# Patient Record
Sex: Female | Born: 1975 | Race: White | Hispanic: No | Marital: Married | Smoking: Current every day smoker
Health system: Southern US, Community
[De-identification: ages and names within clinical notes are randomized; demographics above are authoritative.]

## PROBLEM LIST (undated history)

## (undated) DIAGNOSIS — R350 Frequency of micturition: Secondary | ICD-10-CM

## (undated) DIAGNOSIS — M25519 Pain in unspecified shoulder: Secondary | ICD-10-CM

## (undated) DIAGNOSIS — M25559 Pain in unspecified hip: Secondary | ICD-10-CM

## (undated) DIAGNOSIS — Q282 Arteriovenous malformation of cerebral vessels: Secondary | ICD-10-CM

## (undated) DIAGNOSIS — M436 Torticollis: Secondary | ICD-10-CM

## (undated) DIAGNOSIS — F32A Depression, unspecified: Secondary | ICD-10-CM

## (undated) DIAGNOSIS — F445 Conversion disorder with seizures or convulsions: Secondary | ICD-10-CM

## (undated) DIAGNOSIS — E669 Obesity, unspecified: Secondary | ICD-10-CM

## (undated) DIAGNOSIS — N946 Dysmenorrhea, unspecified: Secondary | ICD-10-CM

## (undated) DIAGNOSIS — G43709 Chronic migraine without aura, not intractable, without status migrainosus: Secondary | ICD-10-CM

## (undated) DIAGNOSIS — G44209 Tension-type headache, unspecified, not intractable: Secondary | ICD-10-CM

## (undated) DIAGNOSIS — I10 Essential (primary) hypertension: Secondary | ICD-10-CM

## (undated) DIAGNOSIS — N83209 Unspecified ovarian cyst, unspecified side: Secondary | ICD-10-CM

## (undated) DIAGNOSIS — G40209 Localization-related (focal) (partial) symptomatic epilepsy and epileptic syndromes with complex partial seizures, not intractable, without status epilepticus: Secondary | ICD-10-CM

## (undated) DIAGNOSIS — R51 Headache: Secondary | ICD-10-CM

## (undated) DIAGNOSIS — IMO0001 Reserved for inherently not codable concepts without codable children: Secondary | ICD-10-CM

## (undated) DIAGNOSIS — Q2730 Arteriovenous malformation, site unspecified: Secondary | ICD-10-CM

## (undated) DIAGNOSIS — F419 Anxiety disorder, unspecified: Secondary | ICD-10-CM

## (undated) DIAGNOSIS — K219 Gastro-esophageal reflux disease without esophagitis: Secondary | ICD-10-CM

## (undated) DIAGNOSIS — D1803 Hemangioma of intra-abdominal structures: Secondary | ICD-10-CM

## (undated) DIAGNOSIS — R519 Headache, unspecified: Secondary | ICD-10-CM

## (undated) DIAGNOSIS — F329 Major depressive disorder, single episode, unspecified: Secondary | ICD-10-CM

## (undated) DIAGNOSIS — J309 Allergic rhinitis, unspecified: Secondary | ICD-10-CM

## (undated) DIAGNOSIS — R531 Weakness: Secondary | ICD-10-CM

## (undated) DIAGNOSIS — Z87442 Personal history of urinary calculi: Secondary | ICD-10-CM

## (undated) DIAGNOSIS — R569 Unspecified convulsions: Secondary | ICD-10-CM

## (undated) DIAGNOSIS — G43009 Migraine without aura, not intractable, without status migrainosus: Secondary | ICD-10-CM

## (undated) DIAGNOSIS — M797 Fibromyalgia: Secondary | ICD-10-CM

## (undated) DIAGNOSIS — R404 Transient alteration of awareness: Secondary | ICD-10-CM

## (undated) DIAGNOSIS — M2550 Pain in unspecified joint: Secondary | ICD-10-CM

## (undated) DIAGNOSIS — I671 Cerebral aneurysm, nonruptured: Secondary | ICD-10-CM

## (undated) HISTORY — DX: Arteriovenous malformation of cerebral vessels: Q28.2

## (undated) HISTORY — DX: Obesity, unspecified: E66.9

## (undated) HISTORY — PX: TUBAL LIGATION: SHX77

## (undated) HISTORY — DX: Weakness: R53.1

## (undated) HISTORY — DX: Tension-type headache, unspecified, not intractable: G44.209

## (undated) HISTORY — DX: Pain in unspecified hip: M25.559

## (undated) HISTORY — PX: CEREBRAL ANGIOGRAM: SHX1326

## (undated) HISTORY — DX: Transient alteration of awareness: R40.4

## (undated) HISTORY — DX: Chronic migraine without aura, not intractable, without status migrainosus: G43.709

## (undated) HISTORY — DX: Headache, unspecified: R51.9

## (undated) HISTORY — DX: Conversion disorder with seizures or convulsions: F44.5

## (undated) HISTORY — DX: Pain in unspecified joint: M25.50

## (undated) HISTORY — DX: Pain in unspecified shoulder: M25.519

## (undated) HISTORY — DX: Headache: R51

## (undated) HISTORY — DX: Localization-related (focal) (partial) symptomatic epilepsy and epileptic syndromes with complex partial seizures, not intractable, without status epilepticus: G40.209

## (undated) HISTORY — DX: Frequency of micturition: R35.0

## (undated) HISTORY — DX: Dysmenorrhea, unspecified: N94.6

## (undated) HISTORY — DX: Gastro-esophageal reflux disease without esophagitis: K21.9

## (undated) HISTORY — DX: Unspecified ovarian cyst, unspecified side: N83.209

## (undated) HISTORY — DX: Reserved for inherently not codable concepts without codable children: IMO0001

## (undated) HISTORY — DX: Torticollis: M43.6

## (undated) HISTORY — DX: Personal history of urinary calculi: Z87.442

## (undated) HISTORY — DX: Migraine without aura, not intractable, without status migrainosus: G43.009

## (undated) HISTORY — DX: Hemangioma of intra-abdominal structures: D18.03

## (undated) HISTORY — DX: Allergic rhinitis, unspecified: J30.9

## (undated) HISTORY — PX: OTHER SURGICAL HISTORY: SHX169

---

## 1999-03-25 HISTORY — PX: WISDOM TOOTH EXTRACTION: SHX21

## 2005-11-18 ENCOUNTER — Inpatient Hospital Stay (HOSPITAL_COMMUNITY): Admission: AD | Admit: 2005-11-18 | Discharge: 2005-11-18 | Payer: Self-pay | Admitting: Family Medicine

## 2005-12-19 ENCOUNTER — Inpatient Hospital Stay (HOSPITAL_COMMUNITY): Admission: AD | Admit: 2005-12-19 | Discharge: 2005-12-19 | Payer: Self-pay | Admitting: Family Medicine

## 2006-02-27 ENCOUNTER — Ambulatory Visit (HOSPITAL_COMMUNITY): Admission: RE | Admit: 2006-02-27 | Discharge: 2006-02-27 | Payer: Self-pay | Admitting: Obstetrics and Gynecology

## 2006-03-13 ENCOUNTER — Inpatient Hospital Stay (HOSPITAL_COMMUNITY): Admission: AD | Admit: 2006-03-13 | Discharge: 2006-03-13 | Payer: Self-pay | Admitting: Obstetrics and Gynecology

## 2006-03-25 ENCOUNTER — Emergency Department (HOSPITAL_COMMUNITY): Admission: EM | Admit: 2006-03-25 | Discharge: 2006-03-25 | Payer: Self-pay | Admitting: Family Medicine

## 2006-05-04 ENCOUNTER — Inpatient Hospital Stay (HOSPITAL_COMMUNITY): Admission: AD | Admit: 2006-05-04 | Discharge: 2006-05-04 | Payer: Self-pay | Admitting: Obstetrics and Gynecology

## 2006-07-21 ENCOUNTER — Inpatient Hospital Stay (HOSPITAL_COMMUNITY): Admission: AD | Admit: 2006-07-21 | Discharge: 2006-07-21 | Payer: Self-pay | Admitting: Obstetrics and Gynecology

## 2006-07-27 ENCOUNTER — Inpatient Hospital Stay (HOSPITAL_COMMUNITY): Admission: AD | Admit: 2006-07-27 | Discharge: 2006-07-29 | Payer: Self-pay | Admitting: Obstetrics & Gynecology

## 2006-07-27 ENCOUNTER — Inpatient Hospital Stay (HOSPITAL_COMMUNITY): Admission: AD | Admit: 2006-07-27 | Discharge: 2006-07-27 | Payer: Self-pay | Admitting: Obstetrics and Gynecology

## 2007-03-04 ENCOUNTER — Ambulatory Visit: Payer: Self-pay | Admitting: Nurse Practitioner

## 2007-03-04 DIAGNOSIS — F419 Anxiety disorder, unspecified: Secondary | ICD-10-CM

## 2007-03-04 DIAGNOSIS — K219 Gastro-esophageal reflux disease without esophagitis: Secondary | ICD-10-CM

## 2007-03-04 DIAGNOSIS — F329 Major depressive disorder, single episode, unspecified: Secondary | ICD-10-CM

## 2007-03-04 DIAGNOSIS — Z87442 Personal history of urinary calculi: Secondary | ICD-10-CM

## 2007-03-04 HISTORY — DX: Gastro-esophageal reflux disease without esophagitis: K21.9

## 2007-03-04 HISTORY — DX: Personal history of urinary calculi: Z87.442

## 2007-03-08 ENCOUNTER — Ambulatory Visit: Payer: Self-pay | Admitting: *Deleted

## 2007-04-05 ENCOUNTER — Ambulatory Visit: Payer: Self-pay | Admitting: Nurse Practitioner

## 2007-04-07 ENCOUNTER — Telehealth (INDEPENDENT_AMBULATORY_CARE_PROVIDER_SITE_OTHER): Payer: Self-pay | Admitting: Nurse Practitioner

## 2007-04-08 ENCOUNTER — Encounter (INDEPENDENT_AMBULATORY_CARE_PROVIDER_SITE_OTHER): Payer: Self-pay | Admitting: Nurse Practitioner

## 2007-04-09 ENCOUNTER — Encounter (INDEPENDENT_AMBULATORY_CARE_PROVIDER_SITE_OTHER): Payer: Self-pay | Admitting: Nurse Practitioner

## 2007-04-09 LAB — CONVERTED CEMR LAB: Pap Smear: NEGATIVE

## 2007-06-04 ENCOUNTER — Ambulatory Visit: Payer: Self-pay | Admitting: Nurse Practitioner

## 2007-06-04 DIAGNOSIS — E669 Obesity, unspecified: Secondary | ICD-10-CM | POA: Insufficient documentation

## 2007-06-04 HISTORY — DX: Obesity, unspecified: E66.9

## 2007-06-08 ENCOUNTER — Telehealth (INDEPENDENT_AMBULATORY_CARE_PROVIDER_SITE_OTHER): Payer: Self-pay | Admitting: Nurse Practitioner

## 2007-06-30 ENCOUNTER — Ambulatory Visit: Payer: Self-pay | Admitting: Nurse Practitioner

## 2007-06-30 LAB — CONVERTED CEMR LAB
Bilirubin Urine: NEGATIVE
Glucose, Urine, Semiquant: NEGATIVE
KOH Prep: NEGATIVE
Ketones, urine, test strip: NEGATIVE
pH: 5.5

## 2007-07-07 ENCOUNTER — Ambulatory Visit (HOSPITAL_COMMUNITY): Admission: RE | Admit: 2007-07-07 | Discharge: 2007-07-07 | Payer: Self-pay | Admitting: Nurse Practitioner

## 2007-08-03 ENCOUNTER — Telehealth (INDEPENDENT_AMBULATORY_CARE_PROVIDER_SITE_OTHER): Payer: Self-pay | Admitting: Nurse Practitioner

## 2007-08-05 ENCOUNTER — Ambulatory Visit: Payer: Self-pay | Admitting: Nurse Practitioner

## 2007-08-05 LAB — CONVERTED CEMR LAB: KOH Prep: NEGATIVE

## 2007-10-25 ENCOUNTER — Ambulatory Visit: Payer: Self-pay | Admitting: Nurse Practitioner

## 2007-10-25 LAB — CONVERTED CEMR LAB
Bilirubin Urine: NEGATIVE
Nitrite: NEGATIVE
Specific Gravity, Urine: 1.025
Urobilinogen, UA: 0.2

## 2007-10-29 ENCOUNTER — Ambulatory Visit (HOSPITAL_COMMUNITY): Admission: RE | Admit: 2007-10-29 | Discharge: 2007-10-29 | Payer: Self-pay | Admitting: Family Medicine

## 2007-11-02 ENCOUNTER — Emergency Department (HOSPITAL_COMMUNITY): Admission: EM | Admit: 2007-11-02 | Discharge: 2007-11-02 | Payer: Self-pay | Admitting: Emergency Medicine

## 2007-11-03 ENCOUNTER — Ambulatory Visit: Payer: Self-pay | Admitting: Nurse Practitioner

## 2007-11-03 DIAGNOSIS — N83209 Unspecified ovarian cyst, unspecified side: Secondary | ICD-10-CM

## 2007-11-03 HISTORY — DX: Unspecified ovarian cyst, unspecified side: N83.209

## 2007-11-04 ENCOUNTER — Encounter (INDEPENDENT_AMBULATORY_CARE_PROVIDER_SITE_OTHER): Payer: Self-pay | Admitting: Nurse Practitioner

## 2007-11-15 ENCOUNTER — Encounter (INDEPENDENT_AMBULATORY_CARE_PROVIDER_SITE_OTHER): Payer: Self-pay | Admitting: Nurse Practitioner

## 2007-11-15 ENCOUNTER — Encounter: Admission: RE | Admit: 2007-11-15 | Discharge: 2007-12-16 | Payer: Self-pay | Admitting: Nurse Practitioner

## 2007-12-22 ENCOUNTER — Encounter (INDEPENDENT_AMBULATORY_CARE_PROVIDER_SITE_OTHER): Payer: Self-pay | Admitting: Nurse Practitioner

## 2008-01-05 ENCOUNTER — Ambulatory Visit: Payer: Self-pay | Admitting: Nurse Practitioner

## 2008-01-05 DIAGNOSIS — J309 Allergic rhinitis, unspecified: Secondary | ICD-10-CM

## 2008-01-05 HISTORY — DX: Allergic rhinitis, unspecified: J30.9

## 2008-01-06 ENCOUNTER — Telehealth (INDEPENDENT_AMBULATORY_CARE_PROVIDER_SITE_OTHER): Payer: Self-pay | Admitting: Nurse Practitioner

## 2008-01-10 ENCOUNTER — Telehealth (INDEPENDENT_AMBULATORY_CARE_PROVIDER_SITE_OTHER): Payer: Self-pay | Admitting: Nurse Practitioner

## 2008-01-11 ENCOUNTER — Ambulatory Visit (HOSPITAL_COMMUNITY): Admission: RE | Admit: 2008-01-11 | Discharge: 2008-01-11 | Payer: Self-pay | Admitting: Family Medicine

## 2008-01-11 ENCOUNTER — Encounter (INDEPENDENT_AMBULATORY_CARE_PROVIDER_SITE_OTHER): Payer: Self-pay | Admitting: Nurse Practitioner

## 2008-01-19 ENCOUNTER — Emergency Department (HOSPITAL_COMMUNITY): Admission: EM | Admit: 2008-01-19 | Discharge: 2008-01-20 | Payer: Self-pay | Admitting: Emergency Medicine

## 2008-03-09 ENCOUNTER — Ambulatory Visit: Payer: Self-pay | Admitting: Nurse Practitioner

## 2008-03-28 ENCOUNTER — Emergency Department (HOSPITAL_COMMUNITY): Admission: EM | Admit: 2008-03-28 | Discharge: 2008-03-28 | Payer: Self-pay | Admitting: Emergency Medicine

## 2008-03-28 ENCOUNTER — Telehealth (INDEPENDENT_AMBULATORY_CARE_PROVIDER_SITE_OTHER): Payer: Self-pay | Admitting: Nurse Practitioner

## 2008-04-05 ENCOUNTER — Ambulatory Visit (HOSPITAL_COMMUNITY): Admission: RE | Admit: 2008-04-05 | Discharge: 2008-04-05 | Payer: Self-pay | Admitting: Obstetrics & Gynecology

## 2008-05-11 ENCOUNTER — Ambulatory Visit: Payer: Self-pay | Admitting: Nurse Practitioner

## 2008-05-11 DIAGNOSIS — G44209 Tension-type headache, unspecified, not intractable: Secondary | ICD-10-CM

## 2008-05-11 HISTORY — DX: Tension-type headache, unspecified, not intractable: G44.209

## 2008-07-17 ENCOUNTER — Telehealth (INDEPENDENT_AMBULATORY_CARE_PROVIDER_SITE_OTHER): Payer: Self-pay | Admitting: Nurse Practitioner

## 2008-07-18 ENCOUNTER — Emergency Department (HOSPITAL_COMMUNITY): Admission: EM | Admit: 2008-07-18 | Discharge: 2008-07-18 | Payer: Self-pay | Admitting: Family Medicine

## 2008-07-24 ENCOUNTER — Encounter (INDEPENDENT_AMBULATORY_CARE_PROVIDER_SITE_OTHER): Payer: Self-pay | Admitting: *Deleted

## 2008-08-18 ENCOUNTER — Ambulatory Visit: Payer: Self-pay | Admitting: Nurse Practitioner

## 2008-08-18 LAB — CONVERTED CEMR LAB
Glucose, Urine, Semiquant: NEGATIVE
Protein, U semiquant: 30
Specific Gravity, Urine: 1.025
Urobilinogen, UA: 0.2

## 2008-08-29 ENCOUNTER — Encounter: Admission: RE | Admit: 2008-08-29 | Discharge: 2008-08-29 | Payer: Self-pay | Admitting: Family Medicine

## 2008-09-19 ENCOUNTER — Ambulatory Visit: Payer: Self-pay | Admitting: Nurse Practitioner

## 2008-10-09 ENCOUNTER — Telehealth (INDEPENDENT_AMBULATORY_CARE_PROVIDER_SITE_OTHER): Payer: Self-pay | Admitting: Nurse Practitioner

## 2008-10-12 ENCOUNTER — Ambulatory Visit: Payer: Self-pay | Admitting: Internal Medicine

## 2008-10-12 DIAGNOSIS — N946 Dysmenorrhea, unspecified: Secondary | ICD-10-CM

## 2008-10-12 HISTORY — DX: Dysmenorrhea, unspecified: N94.6

## 2008-10-12 LAB — CONVERTED CEMR LAB
Chlamydia, DNA Probe: NEGATIVE
GC Probe Amp, Genital: NEGATIVE
KOH Prep: NEGATIVE
Whiff Test: POSITIVE

## 2008-10-13 ENCOUNTER — Telehealth (INDEPENDENT_AMBULATORY_CARE_PROVIDER_SITE_OTHER): Payer: Self-pay | Admitting: Nurse Practitioner

## 2008-10-31 ENCOUNTER — Ambulatory Visit (HOSPITAL_COMMUNITY): Admission: RE | Admit: 2008-10-31 | Discharge: 2008-10-31 | Payer: Self-pay | Admitting: Physician Assistant

## 2008-10-31 ENCOUNTER — Encounter (INDEPENDENT_AMBULATORY_CARE_PROVIDER_SITE_OTHER): Payer: Self-pay | Admitting: Nurse Practitioner

## 2008-10-31 ENCOUNTER — Ambulatory Visit: Payer: Self-pay | Admitting: Physician Assistant

## 2008-10-31 DIAGNOSIS — M25519 Pain in unspecified shoulder: Secondary | ICD-10-CM

## 2008-10-31 HISTORY — DX: Pain in unspecified shoulder: M25.519

## 2008-11-03 ENCOUNTER — Telehealth (INDEPENDENT_AMBULATORY_CARE_PROVIDER_SITE_OTHER): Payer: Self-pay | Admitting: *Deleted

## 2008-11-16 ENCOUNTER — Ambulatory Visit: Payer: Self-pay | Admitting: Nurse Practitioner

## 2008-12-01 ENCOUNTER — Telehealth (INDEPENDENT_AMBULATORY_CARE_PROVIDER_SITE_OTHER): Payer: Self-pay | Admitting: Nurse Practitioner

## 2008-12-11 ENCOUNTER — Encounter (INDEPENDENT_AMBULATORY_CARE_PROVIDER_SITE_OTHER): Payer: Self-pay | Admitting: Nurse Practitioner

## 2008-12-12 ENCOUNTER — Encounter (INDEPENDENT_AMBULATORY_CARE_PROVIDER_SITE_OTHER): Payer: Self-pay | Admitting: Nurse Practitioner

## 2008-12-25 ENCOUNTER — Encounter (INDEPENDENT_AMBULATORY_CARE_PROVIDER_SITE_OTHER): Payer: Self-pay | Admitting: Nurse Practitioner

## 2008-12-25 ENCOUNTER — Encounter: Admission: RE | Admit: 2008-12-25 | Discharge: 2009-02-06 | Payer: Self-pay | Admitting: Nurse Practitioner

## 2009-01-03 ENCOUNTER — Ambulatory Visit: Payer: Self-pay | Admitting: Nurse Practitioner

## 2009-01-11 ENCOUNTER — Telehealth (INDEPENDENT_AMBULATORY_CARE_PROVIDER_SITE_OTHER): Payer: Self-pay | Admitting: Nurse Practitioner

## 2009-04-12 ENCOUNTER — Encounter: Payer: Self-pay | Admitting: Obstetrics and Gynecology

## 2009-04-12 ENCOUNTER — Ambulatory Visit: Payer: Self-pay | Admitting: Obstetrics and Gynecology

## 2009-04-12 LAB — CONVERTED CEMR LAB
MCHC: 32.8 g/dL (ref 30.0–36.0)
MCV: 89.4 fL (ref 78.0–100.0)
Pap Smear: NEGATIVE
Platelets: 214 10*3/uL (ref 150–400)
RBC: 4.53 M/uL (ref 3.87–5.11)
TSH: 1.018 microintl units/mL (ref 0.350–4.500)

## 2009-05-29 ENCOUNTER — Ambulatory Visit: Payer: Self-pay | Admitting: Nurse Practitioner

## 2009-05-29 ENCOUNTER — Telehealth (INDEPENDENT_AMBULATORY_CARE_PROVIDER_SITE_OTHER): Payer: Self-pay | Admitting: Nurse Practitioner

## 2009-05-29 LAB — CONVERTED CEMR LAB
Ketones, urine, test strip: NEGATIVE
Nitrite: NEGATIVE
Specific Gravity, Urine: 1.01
Urobilinogen, UA: 0.2
pH: 7

## 2009-06-05 ENCOUNTER — Ambulatory Visit (HOSPITAL_COMMUNITY): Admission: RE | Admit: 2009-06-05 | Discharge: 2009-06-05 | Payer: Self-pay | Admitting: Internal Medicine

## 2009-06-07 DIAGNOSIS — D1803 Hemangioma of intra-abdominal structures: Secondary | ICD-10-CM | POA: Insufficient documentation

## 2009-06-07 HISTORY — DX: Hemangioma of intra-abdominal structures: D18.03

## 2009-06-11 ENCOUNTER — Telehealth (INDEPENDENT_AMBULATORY_CARE_PROVIDER_SITE_OTHER): Payer: Self-pay | Admitting: Nurse Practitioner

## 2009-06-19 ENCOUNTER — Telehealth (INDEPENDENT_AMBULATORY_CARE_PROVIDER_SITE_OTHER): Payer: Self-pay | Admitting: Nurse Practitioner

## 2009-06-21 ENCOUNTER — Ambulatory Visit: Payer: Self-pay | Admitting: Nurse Practitioner

## 2009-06-29 ENCOUNTER — Ambulatory Visit: Payer: Self-pay | Admitting: Nurse Practitioner

## 2009-07-17 ENCOUNTER — Ambulatory Visit: Payer: Self-pay | Admitting: Nurse Practitioner

## 2009-07-17 LAB — CONVERTED CEMR LAB
AST: 22 units/L (ref 0–37)
Albumin: 4.7 g/dL (ref 3.5–5.2)
Alkaline Phosphatase: 95 units/L (ref 39–117)
Basophils Absolute: 0 10*3/uL (ref 0.0–0.1)
Basophils Relative: 0 % (ref 0–1)
Glucose, Bld: 99 mg/dL (ref 70–99)
HDL: 29 mg/dL — ABNORMAL LOW (ref >39)
LDL Cholesterol: 73 mg/dL (ref 0–99)
Lymphocytes Relative: 34 % (ref 12–46)
MCHC: 32 g/dL (ref 30.0–36.0)
Monocytes Absolute: 0.3 10*3/uL (ref 0.1–1.0)
Neutro Abs: 2.6 10*3/uL (ref 1.7–7.7)
Neutrophils Relative %: 58 % (ref 43–77)
Platelets: 200 10*3/uL (ref 150–400)
Potassium: 3.8 meq/L (ref 3.5–5.3)
RDW: 14.6 % (ref 11.5–15.5)
Sodium: 140 meq/L (ref 135–145)
TSH: 0.632 microintl units/mL (ref 0.350–4.500)
Total Bilirubin: 0.7 mg/dL (ref 0.3–1.2)
Total Protein: 7.4 g/dL (ref 6.0–8.3)
Triglycerides: 240 mg/dL — ABNORMAL HIGH (ref <150)
VLDL: 48 mg/dL — ABNORMAL HIGH (ref 0–40)

## 2009-07-23 ENCOUNTER — Encounter (INDEPENDENT_AMBULATORY_CARE_PROVIDER_SITE_OTHER): Payer: Self-pay | Admitting: Nurse Practitioner

## 2009-08-10 ENCOUNTER — Telehealth (INDEPENDENT_AMBULATORY_CARE_PROVIDER_SITE_OTHER): Payer: Self-pay | Admitting: Nurse Practitioner

## 2009-08-24 ENCOUNTER — Ambulatory Visit: Payer: Self-pay | Admitting: Nurse Practitioner

## 2009-09-11 ENCOUNTER — Telehealth (INDEPENDENT_AMBULATORY_CARE_PROVIDER_SITE_OTHER): Payer: Self-pay | Admitting: Nurse Practitioner

## 2009-09-28 ENCOUNTER — Ambulatory Visit: Payer: Self-pay | Admitting: Nurse Practitioner

## 2009-10-03 ENCOUNTER — Telehealth (INDEPENDENT_AMBULATORY_CARE_PROVIDER_SITE_OTHER): Payer: Self-pay | Admitting: Nurse Practitioner

## 2009-10-16 ENCOUNTER — Telehealth (INDEPENDENT_AMBULATORY_CARE_PROVIDER_SITE_OTHER): Payer: Self-pay | Admitting: Nurse Practitioner

## 2009-10-24 ENCOUNTER — Emergency Department (HOSPITAL_COMMUNITY): Admission: EM | Admit: 2009-10-24 | Discharge: 2009-10-24 | Payer: Self-pay | Admitting: Emergency Medicine

## 2009-10-25 ENCOUNTER — Ambulatory Visit: Payer: Self-pay | Admitting: Internal Medicine

## 2009-11-15 ENCOUNTER — Telehealth: Payer: Self-pay | Admitting: Internal Medicine

## 2009-11-21 ENCOUNTER — Telehealth: Payer: Self-pay | Admitting: Internal Medicine

## 2009-11-23 ENCOUNTER — Ambulatory Visit: Payer: Self-pay | Admitting: Internal Medicine

## 2009-11-27 ENCOUNTER — Telehealth (INDEPENDENT_AMBULATORY_CARE_PROVIDER_SITE_OTHER): Payer: Self-pay | Admitting: Nurse Practitioner

## 2009-12-18 ENCOUNTER — Telehealth (INDEPENDENT_AMBULATORY_CARE_PROVIDER_SITE_OTHER): Payer: Self-pay | Admitting: Nurse Practitioner

## 2010-01-02 ENCOUNTER — Emergency Department (HOSPITAL_COMMUNITY): Admission: EM | Admit: 2010-01-02 | Discharge: 2010-01-02 | Payer: Self-pay | Admitting: Family Medicine

## 2010-01-28 ENCOUNTER — Ambulatory Visit: Payer: Self-pay | Admitting: Nurse Practitioner

## 2010-01-28 DIAGNOSIS — M25559 Pain in unspecified hip: Secondary | ICD-10-CM

## 2010-01-28 HISTORY — DX: Pain in unspecified hip: M25.559

## 2010-02-08 ENCOUNTER — Encounter (INDEPENDENT_AMBULATORY_CARE_PROVIDER_SITE_OTHER): Payer: Self-pay | Admitting: *Deleted

## 2010-02-11 ENCOUNTER — Telehealth (INDEPENDENT_AMBULATORY_CARE_PROVIDER_SITE_OTHER): Payer: Self-pay | Admitting: Nurse Practitioner

## 2010-02-19 ENCOUNTER — Ambulatory Visit (HOSPITAL_COMMUNITY): Admission: RE | Admit: 2010-02-19 | Discharge: 2010-02-19 | Payer: Self-pay | Admitting: Internal Medicine

## 2010-02-20 ENCOUNTER — Telehealth (INDEPENDENT_AMBULATORY_CARE_PROVIDER_SITE_OTHER): Payer: Self-pay | Admitting: Nurse Practitioner

## 2010-02-28 ENCOUNTER — Telehealth (INDEPENDENT_AMBULATORY_CARE_PROVIDER_SITE_OTHER): Payer: Self-pay | Admitting: Nurse Practitioner

## 2010-03-01 ENCOUNTER — Ambulatory Visit: Payer: Self-pay | Admitting: Nurse Practitioner

## 2010-03-01 DIAGNOSIS — R350 Frequency of micturition: Secondary | ICD-10-CM

## 2010-03-01 HISTORY — DX: Frequency of micturition: R35.0

## 2010-03-01 LAB — CONVERTED CEMR LAB
Bilirubin Urine: NEGATIVE
Nitrite: NEGATIVE
Specific Gravity, Urine: 1.015
Urobilinogen, UA: 1
pH: 7

## 2010-03-02 ENCOUNTER — Encounter (INDEPENDENT_AMBULATORY_CARE_PROVIDER_SITE_OTHER): Payer: Self-pay | Admitting: Nurse Practitioner

## 2010-03-27 ENCOUNTER — Encounter (INDEPENDENT_AMBULATORY_CARE_PROVIDER_SITE_OTHER): Payer: Self-pay | Admitting: Nurse Practitioner

## 2010-03-27 ENCOUNTER — Telehealth (INDEPENDENT_AMBULATORY_CARE_PROVIDER_SITE_OTHER): Payer: Self-pay | Admitting: Nurse Practitioner

## 2010-03-27 ENCOUNTER — Ambulatory Visit: Admit: 2010-03-27 | Payer: Self-pay | Admitting: Internal Medicine

## 2010-03-27 DIAGNOSIS — M2550 Pain in unspecified joint: Secondary | ICD-10-CM

## 2010-03-27 HISTORY — DX: Pain in unspecified joint: M25.50

## 2010-04-01 ENCOUNTER — Telehealth (INDEPENDENT_AMBULATORY_CARE_PROVIDER_SITE_OTHER): Payer: Self-pay | Admitting: Nurse Practitioner

## 2010-04-03 ENCOUNTER — Ambulatory Visit: Admit: 2010-04-03 | Payer: Self-pay | Admitting: Nurse Practitioner

## 2010-04-05 ENCOUNTER — Telehealth (INDEPENDENT_AMBULATORY_CARE_PROVIDER_SITE_OTHER): Payer: Self-pay | Admitting: Nurse Practitioner

## 2010-04-13 ENCOUNTER — Encounter: Payer: Self-pay | Admitting: Internal Medicine

## 2010-04-14 ENCOUNTER — Encounter: Payer: Self-pay | Admitting: Internal Medicine

## 2010-04-15 ENCOUNTER — Encounter: Payer: Self-pay | Admitting: Family Medicine

## 2010-04-15 ENCOUNTER — Encounter: Payer: Self-pay | Admitting: Internal Medicine

## 2010-04-21 LAB — CONVERTED CEMR LAB
AST: 27 units/L (ref 0–37)
Albumin: 4.9 g/dL (ref 3.5–5.2)
BUN: 16 mg/dL (ref 6–23)
Beta hcg, urine, semiquantitative: NEGATIVE
Bilirubin Urine: NEGATIVE
CO2: 21 meq/L (ref 19–32)
Calcium: 9.1 mg/dL (ref 8.4–10.5)
Chlamydia, DNA Probe: NEGATIVE
Chloride: 107 meq/L (ref 96–112)
Cholesterol: 110 mg/dL (ref 0–200)
Creatinine, Ser: 0.71 mg/dL (ref 0.40–1.20)
Glucose, Bld: 76 mg/dL (ref 70–99)
HDL: 33 mg/dL — ABNORMAL LOW (ref >39)
Hemoglobin: 14.5 g/dL (ref 12.0–15.0)
Ketones, urine, test strip: NEGATIVE
Lymphocytes Relative: 33 % (ref 12–46)
Lymphs Abs: 2.3 10*3/uL (ref 0.7–4.0)
MCHC: 32.4 g/dL (ref 30.0–36.0)
Monocytes Absolute: 0.5 10*3/uL (ref 0.1–1.0)
Monocytes Relative: 7 % (ref 3–12)
Neutro Abs: 4 10*3/uL (ref 1.7–7.7)
Neutrophils Relative %: 59 % (ref 43–77)
Nitrite: NEGATIVE
Potassium: 4.3 meq/L (ref 3.5–5.3)
RBC: 4.6 M/uL (ref 3.87–5.11)
TSH: 0.67 microintl units/mL (ref 0.350–5.50)
Total CHOL/HDL Ratio: 3.3
Triglycerides: 147 mg/dL (ref <150)
Urobilinogen, UA: 0.2
WBC: 6.8 10*3/uL (ref 4.0–10.5)

## 2010-04-23 ENCOUNTER — Telehealth (INDEPENDENT_AMBULATORY_CARE_PROVIDER_SITE_OTHER): Payer: Self-pay | Admitting: Nurse Practitioner

## 2010-04-25 NOTE — Letter (Signed)
Summary: Handout Printed  Printed Handout:  - Sciatica 

## 2010-04-25 NOTE — Assessment & Plan Note (Signed)
Summary: Frequent Urination  Nurse Visit   Vital Signs:  Patient profile:   35 year old female Menstrual status:  irregular LMP:     02/13/2010 Temp:     97.1 degrees F  Vitals Entered By: Gaylyn Cheers RN (March 01, 2010 10:08 AM) CC: frequency urination with pelvic pain LMP (date): 02/13/2010 LMP - Character: heavy     Enter LMP: 02/13/2010 Last PAP Result NEGATIVE FOR INTRAEPITHELIAL LESIONS OR MALIGNANCY.   Primary Care Provider:  Etta Grandchild MD  CC:  frequency urination with pelvic pain.  History of Present Illness: Feels like she has a plug in her urethra when she tries to make herself void has sharp lower pelvic pain, frequency with sm amts. of urine, when bladder is full has had some lt flank pain. Hx of kidney stones. Denies vag D/C and itching. Has had similar symptoms while on her period. Last period 02/13/10. Will discuss with Jesse Fall.    Allergies: No Known Drug Allergies Laboratory Results   Urine Tests  Date/Time Received: March 01, 2010 10:19 AM   Routine Urinalysis   Color: lt. yellow Glucose: negative   (Normal Range: Negative) Bilirubin: negative   (Normal Range: Negative) Ketone: negative   (Normal Range: Negative) Spec. Gravity: 1.015   (Normal Range: 1.003-1.035) Blood: negative   (Normal Range: Negative) pH: 7.0   (Normal Range: 5.0-8.0) Protein: trace   (Normal Range: Negative) Urobilinogen: 1.0   (Normal Range: 0-1) Nitrite: negative   (Normal Range: Negative) Leukocyte Esterace: negative   (Normal Range: Negative)      Wet Mount Source: vaginal WBC/hpf: 1-5 Bacteria/hpf: rare Clue cells/hpf: none Yeast/hpf: none Wet Mount KOH: Negative Trichomonas/hpf: none   Patient Instructions: 1)  Urine results are normal. 2)  Will send for culture to verify. 3)  Wet prep looks ok. 4)  Be sure to drink plenty of WATER.  No caffinated beverages. 5)  This may be external irritation.   6)  So be sure not to use body wash,  scented lotion, pads, powder or anything else in the vaginal area. 7)  No thong underwear. 8)  Wipe from front to back  Orders Added: 1)  Est. Patient Nurse visit [09003] 2)  Wet Prep [81191YN] 3)  UA Dipstick w/o Micro (automated)  [81003] 4)  T-Culture, Urine [[id number]]  Laboratory Results   Urine Tests    Routine Urinalysis   Color: lt. yellow Glucose: negative   (Normal Range: Negative) Bilirubin: negative   (Normal Range: Negative) Ketone: negative   (Normal Range: Negative) Spec. Gravity: 1.015   (Normal Range: 1.003-1.035) Blood: negative   (Normal Range: Negative) pH: 7.0   (Normal Range: 5.0-8.0) Protein: trace   (Normal Range: Negative) Urobilinogen: 1.0   (Normal Range: 0-1) Nitrite: negative   (Normal Range: Negative) Leukocyte Esterace: negative   (Normal Range: Negative)      Wet Mount/KOH

## 2010-04-25 NOTE — Progress Notes (Signed)
Summary: unable to afford pain patch  Phone Note Call from Patient Call back at Home Phone [phone]   Caller: Patient Reason for Call: Talk to Doctor Summary of Call: pt unable to afford pain patch, request something else be called into [facility] on [address](tramadol does not work).... will also let us know if she is unable to afford effexor since her dosage was upped. She has no insurance for her meds Initial call taken by: Migdalia Dk,  November 15, 2009 1:27 PM  Follow-up for Phone Call        I will hold on prescribing a pain med for now Follow-up by: Etta Grandchild MD,  November 15, 2009 2:42 PM

## 2010-04-25 NOTE — Progress Notes (Signed)
Summary: Concerned re pain and low CBGs  Phone Note Call from Patient   Summary of Call: PLEASE CALL PATIENT NEED TO SPEAK WITH THE NURSE ABOUT A PAIN IN HIPS,LOWER ARE PAIN , SUGAR LEVEL PLEASE CALL HER AT [phone] OR [phone] Initial call taken by: Domenic Polite,  March 27, 2010 2:59 PM  Follow-up for Phone Call        Pain in right shoulder, both hips and vaginal area getting worse -- can't sit on floor, tries sitting on pillow, repositioning, walking, pain goes down her leg.  Neurontin is not helping.  Antecubital area of elbows can't be touched -- aches and if she carries something and lets go, pain becomes bad.  Wants to know what she can do -- feels that no one believes that she's in pain.  Also, sugar has been dropping.  Had hot chocolate this morning, an hour later her CBG was at 78.  Had spaghetti and soda at lunch, an hour later, CBG was 97.  Gets nauseated when BS gets in 90s, feels "weird" when BS is in 80s.  Is concerned about low blood sugars. Follow-up by: Dutch Quint RN,  March 27, 2010 4:48 PM  Additional Follow-up for Phone Call Additional follow up Details #1::        Sounds like she is eating a lot in way of carbs, but not protein and good fat.  She should make sure she is having something with protein at every meal to avoid dropping her sugars.  If she is not sure what has protein, see if she is willing to speak with Susie Piper or go to nutrition. With regards to her pain, she probably needs a follow up appt. with [facility]--please set that up if she does not already have scheduled Additional Follow-up by: Julieanne Manson MD,  March 27, 2010 6:58 PM  New Problems: PAIN IN JOINT, MULTIPLE SITES (ICD-719.49)   Additional Follow-up for Phone Call Additional follow up Details #2::    pt is scheduled an appt on 04/03/09 at 2:15. Follow-up by: Levon Hedger,  March 28, 2010 5:16 PM  Additional Follow-up for Phone Call Additional follow up Details #3::  Details for Additional Follow-up Action Taken: pt says she eats chicken every day and pt appt had to be rescheduled since provider will be out.... pt says diabetes runs in her family... she would like to know what to do about sugars and pain in hip and shoulder..Armenia Shannon  March 29, 2010 12:05 PM  Low blood sugar is NOT diabetes. She just needs to eat more protein and eat more frequent.  Avoid sugary and carbs as this makes blood sugar increase and then decrase quickly.  Pain - ongoing issue with pt  I am unsure of the exact nature of the cause.  Would she like to see a rheumatologist?  This will require a co-payment on her behalf and I can get Arna Medici to check to see the exact amount if she wants.  n.[email or web address],fnp  March 29, 2010  Levon Hedger  April 01, 2010 3:02 PM pt informed of above and is interested in being referred to Rheumatologist.  She is aware Arna Medici will contact with cost to pt before setting up appt date and time. 1:39 PM    New Problems: PAIN IN JOINT, MULTIPLE SITES (ICD-719.49)

## 2010-04-25 NOTE — Letter (Signed)
Summary: *HSN Results Follow up  [facility]  [address] [place], [place] [address]   Phone: [phone]  Fax: [phone]      02/08/2010   Katie [name] [address] [place], [place]  [address]   Dear  Ms. Katie [name],                            ____S.Drinkard,FNP   ____D. Gore,FNP       ____B. McPherson,MD   ____V. Rankins,MD    ____E. Mulberry,MD    _X___N. Daphine Deutscher, FNP  ____D. Reche Jurrell Royster, MD    ____K. Philipp Deputy, MD    ____Other     This letter is to inform you that your recent test(s):  _______Pap Smear    _______Lab Test     _______X-ray    _______ is within acceptable limits  _______ requires a medication change  _______ requires a follow-up lab visit  _______ requires a follow-up visit with your provider   Comments: I being trying to reach you  because I need to make you an appt for a MRI                     please, call me at your earliest convinience                     Thank you .        _________________________________________________________ If you have any questions, please contact our office                     Sincerely,  Katie [name] [facility]
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
## 2010-04-25 NOTE — Progress Notes (Signed)
Summary: ACUTE  Phone Note Call from Patient Call back at [phone]   Caller: Patient Summary of Call: PT WANTS TO LET THE DOCTOR KNOW THAT HER ELBOWS ARE STARTING OT HURT AND HIPS, POSS UTI .Marland Kitchen  Initial call taken by: LISAIDA RIVERA  Follow-up for Phone Call        Elbows hurting, Lt hip and now rt hip hurting. Shoulder pain. Frequent but sm amt of urination with back pain, no fever. Will schedule with triage tomorrow to check U/A. Appt. scheduled 01/12 with PCP to address joint pain Follow-up by: Gaylyn Cheers RN,  February 28, 2010 5:17 PM
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
## 2010-04-25 NOTE — Progress Notes (Signed)
Summary: MRI results  Phone Note Outgoing Call   Summary of Call: notify pt that MRI is ok  still shows that she has the hemangioma in her liver. no change. no need for anything further Initial call taken by: Lehman Prom FNP,  February 20, 2010 2:07 PM  Follow-up for Phone Call        left message on machine for pt to return call to the office. Levon Hedger  February 21, 2010 11:38 AM  pt informed of above information.  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

## 2010-04-25 NOTE — Progress Notes (Signed)
Summary: med refill  Phone Note Call from Patient   Complaint: Chest Pain Summary of Call: Pt requesting refill for klonipin she has been out several days and her nerves are bad.  [facility] [address]  She last got #20 on 06/30/09. Initial call taken by: Vesta Mixer CMA,  Aug 10, 2009 3:29 PM  Follow-up for Phone Call        Rx printed. Advise pt that she has stretched her supply very well.  I appreciate she taking ONLY when absolutely needed be sure that pt has effexor and zoloft as well Follow-up by: Lehman Prom FNP,  Aug 10, 2009 3:46 PM  Additional Follow-up for Phone Call Additional follow up Details #1::        [facility]  Additional Follow-up by: Gaylyn Cheers RN,  Aug 10, 2009 4:33 PM    Prescriptions: CLONAZEPAM 0.5 MG TABS (CLONAZEPAM) One tablet by mouth daily as needed for anxiety  #20 x 0   Entered and Authorized by:   Lehman Prom FNP   Signed by:   Lehman Prom FNP on 08/10/2009   Method used:   Printed then faxed to ...       [facility] [id number]       [address]       [place], [place]  [address]       Ph: [phone]       Fax: [phone]   RxID:   [id number]

## 2010-04-25 NOTE — Letter (Signed)
Summary: AMANDA/ SUMMARY  AMANDA/ SUMMARY   Imported ByArta Bruce 11/21/2009 15:49:42  _____________________________________________________________________  External Attachment:    Type:   Image     Comment:   External Document

## 2010-04-25 NOTE — Progress Notes (Signed)
Summary: Needs appointment  Phone Note Outgoing Call   Summary of Call: notify pt that u/a ok Schedule her an appt for an abdominal u/s - order in basket Notify pt that she will also be referred to sports medicine for her right shoulder since it is not improving. (referral in basket) Initial call taken by: Lehman Prom FNP,  May 29, 2009 2:40 PM  Follow-up for Phone Call        pt in office on 06/01/09 and was given appt date and time. Levon Hedger  June 04, 2009 11:57 AM  Follow-up by: Levon Hedger,  June 04, 2009 11:57 AM

## 2010-04-25 NOTE — Assessment & Plan Note (Signed)
Summary: Depression   Vital Signs:  Patient profile:   35 year old female Menstrual status:  irregular Height:      61.25 inches Weight:      175 pounds BMI:     32.92 Temp:     98.3 degrees F oral Pulse rate:   99 / minute Pulse rhythm:   regular Resp:     18 per minute BP sitting:   127 / 88  (left arm) Cuff size:   large  Vitals Entered By: Arm[place] Shannon (June 29, 2009 1:58 PM) CC: one month f/u...., Depression Is Patient Diabetic? No Pain Assessment Patient in pain? no       Does patient need assistance? Functional Status Self care Ambulation Normal   CC:  one month f/u.... and Depression.  History of Present Illness:  Pt into the office for follow up on medication change. Pt was started on effexor on last month. She added that to the zoloft.  Anxiety - she is still having some anxiety episodes.  She is taking Klonopin as needed for the anxiety.  Her family makes her very anxious.  She completed her supply of Klonopin before the end of 1 month. Aquilla Solian - pt has scheduled an appt to see Aquilla Solian as ordered.  She is having some helpful sessions  Mother also has some problems with bipolar and manic depression and takes multiple medications for her psych issues.  Still with concerns about her right upper quadrant -  Still with pain in the right upper quadrant Recent MRI revealed hemangioma.  Recommended recheck in 6 months.  Obesity - down 3 pounds since her last visit  Depression History:      The patient comes in today for her third follow up visit for depression.  The patient denies a depressed mood most of the day and a diminished interest in her usual daily activities.  The patient denies fatigue (loss of energy) and impaired concentration (indecisiveness).        The patient denies that she feels like life is not worth living, denies that she wishes that she were dead, and denies that she has thought about ending her life.         Depression  Treatment History:  Prior Medication Used:   Start Date: Assessment of Effect:   Comments:  Zoloft (sertraline)     --     some improvement     -- Effexor XR (venlafaxine-XR)   --     some improvement     started   Habits & Providers  Alcohol-Tobacco-Diet     Alcohol drinks/day: <1     Alcohol Counseling: to decrease amount and/or frequency of alcohol intake     Alcohol type: beer     Tobacco Status: quit     Year Quit: 2007  Exercise-Depression-Behavior     Does Patient Exercise: yes     Exercise Counseling: to improve exercise regimen     Type of exercise: walk     Depression Counseling: not indicated; screening negative for depression     Drug Use: no     Seat Belt Use: 100  Comments: Pt has started walking around the baseball field while her son is at practice  Current Medications (verified): 1)  Zoloft 100 Mg Tabs (Sertraline Hcl) .Marland Kitchen.. 1 Tablet By Mouth Daily 2)  Soma 350 Mg  Tabs (Carisoprodol) .Marland Kitchen.. 1 Tablet By Mouth At Bedtime As Needed For Spasms 3)  Nexium 40 Mg  Cpdr (Esomeprazole Magnesium) .Marland Kitchen.. 1 Tablet By Mouth Daily For Stomach 4)  Imitrex 50 Mg Tabs (Sumatriptan Succinate) .... One Tablet By Mouth At Onset of Headache 5)  Triamcinolone Acetonide 0.1 % Oint (Triamcinolone Acetonide) .Marland Kitchen.. 1 Application Topically Two Times A Day To Affected Area 6)  Nabumetone 500 Mg Tabs (Nabumetone) .... One Tablet By Mouth Two Times A Day As Needed For Pain 7)  Tramadol Hcl 50 Mg Tabs (Tramadol Hcl) .... Take 1/2 To 1 By Mouth Three Times A Day As Needed For Pain 8)  Clonazepam 0.5 Mg Tabs (Clonazepam) .... One Tablet By Mouth Daily As Needed For Anxiety 9)  Effexor Xr 75 Mg Xr24h-Cap (Venlafaxine Hcl) .... One Tablet By Mouth Daily For Depression  Allergies (verified): No Known Drug Allergies  Family History: Family History Thyroid disease - maternal grandmother bipolar - mother depression - mother  Social History: Does Patient Exercise:  yes  Review of Systems CV:   Denies chest pain or discomfort. Resp:  Denies cough. GI:  Complains of abdominal pain; denies constipation, nausea, and vomiting; right upper quadrant.  Physical Exam  General:  alert.   Head:  normocephalic.   Lungs:  normal breath sounds.   Heart:  normal rate and regular rhythm.   Abdomen:  BS x 4  tender with palpation of right upper quad Neurologic:  alert & oriented X3.   Skin:  color normal.   Psych:  Oriented X3.     Impression & Recommendations:  Problem # 1:  DEPRESSION (ICD-311) pt is doing better with effexor continue this will zoloft Clonazepam should be reserved for only as needed Her updated medication list for this problem includes:    Zoloft 100 Mg Tabs (Sertraline hcl) .Marland Kitchen... 1 tablet by mouth daily    Clonazepam 0.5 Mg Tabs (Clonazepam) ..... One tablet by mouth daily as needed for anxiety    Effexor Xr 75 Mg Xr24h-cap (Venlafaxine hcl) ..... One tablet by mouth daily for depression  Problem # 2:  ANXIETY (ICD-300.00) pt has been into see Aquilla Solian as ordered Clonazepam only as needed Her updated medication list for this problem includes:    Zoloft 100 Mg Tabs (Sertraline hcl) .Marland Kitchen... 1 tablet by mouth daily    Clonazepam 0.5 Mg Tabs (Clonazepam) ..... One tablet by mouth daily as needed for anxiety    Effexor Xr 75 Mg Xr24h-cap (Venlafaxine hcl) ..... One tablet by mouth daily for depression  Problem # 3:  LIVER HEMANGIOMA (ICD-228.04) will recheck ultrasound in 6 months pt advised of dx  Problem # 4:  OBESITY (ICD-278.00) down 3 pounds since his last visit continue to maintain diet and exercise  Problem # 5:  BICEPS TENDINITIS, RIGHT (ICD-726.12) pt to keep appt with sports medicine  Complete Medication List: 1)  Zoloft 100 Mg Tabs (Sertraline hcl) .Marland Kitchen.. 1 tablet by mouth daily 2)  Soma 350 Mg Tabs (Carisoprodol) .Marland Kitchen.. 1 tablet by mouth at bedtime as needed for spasms 3)  Nexium 40 Mg Cpdr (Esomeprazole magnesium) .Marland Kitchen.. 1 tablet by mouth daily for  stomach 4)  Imitrex 50 Mg Tabs (Sumatriptan succinate) .... One tablet by mouth at onset of headache 5)  Triamcinolone Acetonide 0.1 % Oint (Triamcinolone acetonide) .Marland Kitchen.. 1 application topically two times a day to affected area 6)  Nabumetone 500 Mg Tabs (Nabumetone) .... One tablet by mouth two times a day as needed for pain 7)  Tramadol Hcl 50 Mg Tabs (Tramadol hcl) .... Take 1/2 to 1 by mouth three times  a day as needed for pain 8)  Clonazepam 0.5 Mg Tabs (Clonazepam) .... One tablet by mouth daily as needed for anxiety 9)  Effexor Xr 75 Mg Xr24h-cap (Venlafaxine hcl) .... One tablet by mouth daily for depression  Patient Instructions: 1)  Blood pressure is doing much better. 2)  Schedule appointment next week for fasting labs - lipids, cmp,cbc,tsh 3)  Take the miralax - mix with a full glass of water or juice Monday-Wednesday and Friday. 4)  You will need repeat ultrasound done in August 2011 5)  Follow up as needed Prescriptions: CLONAZEPAM 0.5 MG TABS (CLONAZEPAM) One tablet by mouth daily as needed for anxiety  #20 x 0   Entered and Authorized by:   Lehman Prom FNP   Signed by:   Lehman Prom FNP on 06/29/2009   Method used:   Print then Give to Patient   RxID:   [id number]

## 2010-04-25 NOTE — Progress Notes (Signed)
  Phone Note Call from Patient Call back at Home Phone [phone]   Caller: Patient Summary of Call: Patient called stating that she is out of Effexor and was told that MD would be increasing. Also per pt she is unable to afford the pain patch (160$) and would like something for pain that is less expensive and in pill form. Last is request refill on Klonipin, she will be out soon. Please advise Thanks Initial call taken by: Rock Nephew CMA,  November 21, 2009 9:25 AM  Follow-up for Phone Call        needs to be seen Follow-up by: Etta Grandchild MD,  November 21, 2009 9:29 AM  Additional Follow-up for Phone Call Additional follow up Details #1::        Pt scheduled for f/u tomorrow Additional Follow-up by: Lamar Sprinkles, CMA,  November 21, 2009 3:05 PM

## 2010-04-25 NOTE — Progress Notes (Signed)
Summary: Wants referral for MRI  Phone Note Call from Patient   Summary of Call: States that although she knows it's not August, would like referral to get MRI of her liver hemangioma done. Initial call taken by: Dutch Quint RN,  October 16, 2009 9:44 AM  Follow-up for Phone Call        previous ultrasound done 06/05/2009 she needed 6 month follow up which is 12/06/2009 i asked pt to call back during the month of August to get it scheduled. It is still too soon to schedule. Call back in 1 month (at the end of August) to get appt scheduled Follow-up by: Lehman Prom FNP,  October 16, 2009 2:25 PM  Additional Follow-up for Phone Call Additional follow up Details #1::        Advised of provider's response - verbalized understanding. Additional Follow-up by: Dutch Quint RN,  October 16, 2009 4:21 PM

## 2010-04-25 NOTE — Assessment & Plan Note (Signed)
Summary: Depression   Vital Signs:  Patient profile:   35 year old female Menstrual status:  irregular LMP:     05/06/2009 Weight:      178.1 pounds Temp:     98 degrees F oral Pulse rate:   87 / minute Pulse rhythm:   regular Resp:     18 per minute BP sitting:   149 / 107  (left arm) Cuff size:   regular  Vitals Entered By: Gaylyn Cheers RN (May 29, 2009 11:30 AM) CC: Does not feel like Zoloft is working requesting something else, "I hate life right now" complains of rt flank pain, dysuria., Depression, Abdominal Pain Is Patient Diabetic? No Pain Assessment Patient in pain? yes       Does patient need assistance? Functional Status Self care Ambulation Normal Comments did not bring meds states it's all the same except she takes Motrin as needed for menstrual pain LMP (date): 05/06/2009 LMP - Character: heavy     Menstrual Status irregular Enter LMP: 05/06/2009 Last PAP Result NEGATIVE FOR INTRAEPITHELIAL LESIONS OR MALIGNANCY.   CC:  Does not feel like Zoloft is working requesting something else, "I hate life right now" complains of rt flank pain, dysuria., Depression, and Abdominal Pain.  History of Present Illness:  Pt into the office with complaints of depression.    Depression History:      The patient denies a depressed mood most of the day and a diminished interest in her usual daily activities.  Positive alarm features for depression include insomnia, feelings of worthlessness (guilt), and impaired concentration (indecisiveness).  However, she denies fatigue (loss of energy) and recurrent thoughts of death or suicide.        Psychosocial stress factors include major life changes.  The patient denies that she feels like life is not worth living, denies that she wishes that she were dead, and denies that she has thought about ending her life.  Due to her current symptoms, it often takes extra effort to do the things she needs to do.         Depression  Treatment History:  Prior Medication Used:   Start Date: Assessment of Effect:   Comments:  Zoloft (sertraline)     --     some improvement     -- Effexor XR (venlafaxine-XR)   --       --       started  Dyspepsia History:      There is a prior history of GERD.      Habits & Providers  Alcohol-Tobacco-Diet     Alcohol drinks/day: <1     Alcohol Counseling: to decrease amount and/or frequency of alcohol intake     Alcohol type: beer     Tobacco Status: quit     Year Quit: 2007  Exercise-Depression-Behavior     Does Patient Exercise: no     Exercise Counseling: to improve exercise regimen     Type of exercise: gym     Have you felt down or hopeless? yes     Have you felt little pleasure in things? yes     Depression Counseling: not indicated; screening negative for depression     Drug Use: no     Seat Belt Use: 100  Allergies (verified): No Known Drug Allergies  Social History: Does Patient Exercise:  no  Review of Systems General:  Denies fever. GI:  Complains of abdominal pain; right upper quad - pt was scheduled an  ultrasound to check gallbladder several months ago but she did not have the test done. MS:  Complains of muscle; right shoulder. Neuro:  Denies headaches. Psych:  Complains of depression.  Physical Exam  General:  alert.   Head:  normocephalic.   Lungs:  normal breath sounds.   Heart:  normal rate and regular rhythm.   Abdomen:  normal bowel sounds.   Msk:  up to the exam table Neurologic:  alert & oriented X3.   Psych:  Oriented X3.   crying during the exam   Impression & Recommendations:  Problem # 1:  DEPRESSION (ICD-311) Will refer to Aquilla Solian will continue zoloft but add Effexor Klonopin as needed for panic attacks PHQ-9 score = 26 The following medications were removed from the medication list:    Klonopin 0.5 Mg Tabs (Clonazepam) ..... One tablet by mouth daily as needed for anxiety Her updated medication list for this problem  includes:    Zoloft 100 Mg Tabs (Sertraline hcl) .Marland Kitchen... 1 tablet by mouth daily    Clonazepam 0.5 Mg Tabs (Clonazepam) ..... One tablet by mouth daily as needed for anxiety    Effexor Xr 75 Mg Xr24h-cap (Venlafaxine hcl) ..... One tablet by mouth daily for depression  Orders: Misc. Referral (Misc. Ref)  Problem # 2:  ABDOMINAL PAIN, RIGHT UPPER QUADRANT (ICD-789.01) Will order abdominal u/s Orders: Ultrasound (Ultrasound)  Problem # 3:  BICEPS TENDINITIS, RIGHT (ICD-726.12) will refer to for further assessment pt has been to physical therapy about 3 months ago Orders: Sports Medicine (Sports Med)  Complete Medication List: 1)  Zoloft 100 Mg Tabs (Sertraline hcl) .Marland Kitchen.. 1 tablet by mouth daily 2)  Soma 350 Mg Tabs (Carisoprodol) .Marland Kitchen.. 1 tablet by mouth at bedtime as needed for spasms 3)  Nexium 40 Mg Cpdr (Esomeprazole magnesium) .Marland Kitchen.. 1 tablet by mouth daily for stomach 4)  Imitrex 50 Mg Tabs (Sumatriptan succinate) .... One tablet by mouth at onset of headache 5)  Triamcinolone Acetonide 0.1 % Oint (Triamcinolone acetonide) .Marland Kitchen.. 1 application topically two times a day to affected area 6)  Nabumetone 500 Mg Tabs (Nabumetone) .... One tablet by mouth two times a day as needed for pain 7)  Tramadol Hcl 50 Mg Tabs (Tramadol hcl) .... Take 1/2 to 1 by mouth three times a day as needed for pain 8)  Clonazepam 0.5 Mg Tabs (Clonazepam) .... One tablet by mouth daily as needed for anxiety 9)  Effexor Xr 75 Mg Xr24h-cap (Venlafaxine hcl) .... One tablet by mouth daily for depression  Other Orders: UA Dipstick w/o Micro (automated)  (16109)  Patient Instructions: 1)  Depression - make an appointment with Aquilla Solian 2)  Continue to take zoloft 100mg  by mouth daily. 3)  Will add effexor 75mg  by mouth daily. 4)  Klonopin as needed for panic attacks 5)  You will be referred to sports medicine for your right shoulder 6)  Follow up in 1 month for mood. 7)  Recheck Blood  pressure Prescriptions: EFFEXOR XR 75 MG XR24H-CAP (VENLAFAXINE HCL) One tablet by mouth daily for depression  #30 x 5   Entered and Authorized by:   Lehman Prom FNP   Signed by:   Lehman Prom FNP on 05/29/2009   Method used:   Faxed to ...       [facility] (retail)       [address] [place], [place]  [address]  Ph: [phone]       Fax: [phone]   RxID:   [id number] CLONAZEPAM 0.5 MG TABS (CLONAZEPAM) One tablet by mouth daily as needed for anxiety  #20 x 0   Entered and Authorized by:   Lehman Prom FNP   Signed by:   Lehman Prom FNP on 05/29/2009   Method used:   Print then Give to Patient   RxID:   [id number]   Laboratory Results   Urine Tests  Date/Time Received: May 29, 2009 12:48 PM  Date/Time Reported: May 29, 2009 12:48 PM   Routine Urinalysis   Color: lt. yellow Glucose: negative   (Normal Range: Negative) Bilirubin: negative   (Normal Range: Negative) Ketone: negative   (Normal Range: Negative) Spec. Gravity: 1.010   (Normal Range: 1.003-1.035) Blood: negative   (Normal Range: Negative) pH: 7.0   (Normal Range: 5.0-8.0) Protein: trace   (Normal Range: Negative) Urobilinogen: 0.2   (Normal Range: 0-1) Nitrite: negative   (Normal Range: Negative) Leukocyte Esterace: trace   (Normal Range: Negative)

## 2010-04-25 NOTE — Progress Notes (Signed)
Summary: REQ REFILL TRAMADOL  Phone Note Call from Patient Call back at [phone]   Summary of Call: Patient is requesting rx for tramadol. She says that her insurance does not cover the PT MD ordered. Please review other open phone note to the [facility]. Per that note, patient no longer has insurance.  Initial call taken by: Lamar Sprinkles, CMA,  December 18, 2009 10:45 AM  Follow-up for Phone Call        forward to N. [name],fnp Follow-up by: Levon Hedger,  December 18, 2009 11:26 AM  Additional Follow-up for Phone Call Additional follow up Details #1::        pt into the office today   Additional Follow-up by: Lehman Prom FNP,  January 28, 2010 4:56 PM

## 2010-04-25 NOTE — Progress Notes (Signed)
Summary: FYI  Phone Note Call from Patient   Summary of Call: FYI...Marland Kitchenpt called and said that she wanted to let Zena Amos know that her great uncle had colon cancer and he has Leukemia.   Initial call taken by: Levon Hedger,  June 11, 2009 4:13 PM  Follow-up for Phone Call        noted Follow-up by: Lehman Prom FNP,  June 21, 2009 8:31 AM

## 2010-04-25 NOTE — Progress Notes (Signed)
Summary: Office Visit//DEPRESSION SCREENING  Office Visit//DEPRESSION SCREENING   Imported By: Arta Bruce 07/30/2009 15:40:17  _____________________________________________________________________  External Attachment:    Type:   Image     Comment:   External Document

## 2010-04-25 NOTE — Letter (Signed)
Summary: TEST ORDER FORM /ULTRASOUND//APPT DATE & TIME  TEST ORDER FORM /ULTRASOUND//APPT DATE & TIME   Imported By: Arta Bruce 08/02/2009 12:20:09  _____________________________________________________________________  External Attachment:    Type:   Image     Comment:   External Document

## 2010-04-25 NOTE — Progress Notes (Signed)
Summary: NEED MEDS CHANGE  Phone Note Call from Patient   Summary of Call: PT WOULD LIKE TO CHANGE THE MEDICINE FOR HEADACHE BECAUSE THE MEDICINE IS NOT WORKING PLEASE CALL HER [phone] NEED TO KNOW WHEN IS HER MRI APPOINTMENT. Initial call taken by: Domenic Polite,  February 11, 2010 2:22 PM  Follow-up for Phone Call        Imitrex does not work for her migraines, requesting something else. Did not go for MRI states she did not know about it. Will alert Arna Medici and have her reschedule Follow-up by: Gaylyn Cheers RN,  February 11, 2010 5:20 PM  Additional Follow-up for Phone Call Additional follow up Details #1::        looks like imitrex was d/c'd in august so she must have it from another rx will change to a  butalbital medication that may help some with headaches Additional Follow-up by: Lehman Prom FNP,  February 11, 2010 5:48 PM    Additional Follow-up for Phone Call Additional follow up Details #2::    Pt. notified. Rx faxed. Follow-up by: Gaylyn Cheers RN,  February 12, 2010 9:23 AM  New/Updated Medications: BUTALBITAL-APAP-CAFFEINE 50-325-40 MG TABS (BUTALBITAL-APAP-CAFFEINE) One tablet by mouth daily as needed for headache Prescriptions: BUTALBITAL-APAP-CAFFEINE 50-325-40 MG TABS (BUTALBITAL-APAP-CAFFEINE) One tablet by mouth daily as needed for headache  #30 x 0   Entered and Authorized by:   Lehman Prom FNP   Signed by:   Lehman Prom FNP on 02/11/2010   Method used:   Printed then faxed to ...       [facility] [id number]       [address]       [place], [place]  [address]       Ph: [phone]       Fax: [phone]   RxID:   [id number]

## 2010-04-25 NOTE — Assessment & Plan Note (Signed)
Summary: Anxiety    Vital Signs:  Patient profile:   35 year old female Menstrual status:  irregular LMP:     01/2010 Weight:      166.1 pounds Temp:     99.1 degrees F oral Pulse rate:   88 / minute Pulse rhythm:   regular Resp:     20 per minute BP sitting:   130 / 84  (left arm) Cuff size:   regular  Vitals Entered By: Levon Hedger (January 28, 2010 3:50 PM) CC: anxiety medication renewal, Abdominal Pain Is Patient Diabetic? No Pain Assessment Patient in pain? yes     Location: hips Intensity: 8  Does patient need assistance? Functional Status Self care Ambulation Normal LMP (date): 01/2010 LMP - Character: heavy     Enter LMP: 01/2010 Last PAP Result NEGATIVE FOR INTRAEPITHELIAL LESIONS OR MALIGNANCY.   Primary Care Provider:  Etta Grandchild MD  CC:  anxiety medication renewal and Abdominal Pain.  History of Present Illness:  Pt into the office for f/u on depression She has been seeing another provider but insurance has lapsed and she has now returned to this office. She has only been without her effexor for about 2 days.   Social - pt has restarted school and is going for billing and medical assistance.  Dyspepsia History:      She has no alarm features of dyspepsia including no history of melena, hematochezia, dysphagia, persistent vomiting, or involuntary weight loss > 5%.  There is a prior history of GERD.  The patient does not have a prior history of documented ulcer disease.  The dominant symptom is heartburn or acid reflux.  An H-2 blocker medication is not currently being taken.  She has no history of a positive H. Pylori serology.     Habits & Providers  Alcohol-Tobacco-Diet     Alcohol drinks/day: <1     Alcohol Counseling: to decrease amount and/or frequency of alcohol intake     Alcohol type: beer     >5/day in last 3 mos: no     Feels need to cut down: no     Feels annoyed by complaints: no     Feels guilty re: drinking: no  Needs 'eye opener' in am: no     Tobacco Status: quit     Tobacco Counseling: to remain off tobacco products     Year Quit: 2007  Exercise-Depression-Behavior     Does Patient Exercise: yes     Exercise Counseling: to improve exercise regimen     Type of exercise: walk     Have you felt down or hopeless? yes     Have you felt little pleasure in things? yes     Depression Counseling: not indicated; screening negative for depression     STD Risk: no risk noted     Drug Use: never     Seat Belt Use: 100  Allergies (verified): No Known Drug Allergies  Review of Systems CV:  Denies chest pain or discomfort. Resp:  Denies cough. GI:  Denies abdominal pain, nausea, and vomiting. MS:  Complains of joint pain; left hip - unable to stand at times and ambulate.  Started about 4 months ago.  progressive right shouler and bil elbows with pain. Unable to lift heavy objects due to radiation of pain down into the hands.  Physical Exam  General:  alert.   Head:  normocephalic.   Lungs:  normal breath sounds.   Heart:  normal  rate and regular rhythm.     Hip Exam  Hip Exam:    Left:    Inspection:  Normal    Palpation:  Normal    Stability:  stable    Tenderness:  trochanteric bursa    Swelling:  trochanteric bursa    Erythema:  no    Impression & Recommendations:  Problem # 1:  DEPRESSION (ICD-311) will restart meds will increase effexor Her updated medication list for this problem includes:    Effexor Xr 75 Mg Xr24h-cap (Venlafaxine hcl) ..... One tablet by mouth daily for 1 week then increase to 2 capsules by mouth daily    Clonazepam 0.5 Mg Tabs (Clonazepam) ..... One tablet by mouth two times a day as needed for nerves  Problem # 2:  ANXIETY (ICD-300.00)  Her updated medication list for this problem includes:    Effexor Xr 75 Mg Xr24h-cap (Venlafaxine hcl) ..... One tablet by mouth daily for 1 week then increase to 2 capsules by mouth daily    Clonazepam 0.5 Mg Tabs  (Clonazepam) ..... One tablet by mouth two times a day as needed for nerves  Problem # 3:  LIVER HEMANGIOMA (ICD-228.04)  needs repeat MRI  Orders: MRI without Contrast (MRI w/o Contrast)  Problem # 4:  HIP PAIN, LEFT (ICD-719.45) ? sciatica will start gabapentin The following medications were removed from the medication list:    Nabumetone 500 Mg Tabs (Nabumetone) ..... One tablet by mouth two times a day as needed for pain  Problem # 5:  NEED PROPHYLACTIC VACCINATION&INOCULATION FLU (ICD-V04.81) given today  Complete Medication List: 1)  Nexium 40 Mg Cpdr (Esomeprazole magnesium) .Marland Kitchen.. 1 tablet by mouth daily for stomach 2)  Effexor Xr 75 Mg Xr24h-cap (Venlafaxine hcl) .... One tablet by mouth daily for 1 week then increase to 2 capsules by mouth daily 3)  Clonazepam 0.5 Mg Tabs (Clonazepam) .... One tablet by mouth two times a day as needed for nerves 4)  Neurontin 300 Mg Caps (Gabapentin) .Marland Kitchen.. 1 capsule by mouth daily x 3 days then increase to 2 capsules by mouth nightly  Other Orders: Flu Vaccine 59yrs + (14782) Admin 1st Vaccine (95621)  Patient Instructions: 1)  Flu vaccine given today in office 2)  Left hip - ? sciatica (read handout) place a pillow in your chair that you sit on to do your schoolwork 3)  Lyrica would be best but none available 4)  Start neurontin 300mg  by mouth nightly x 3 night then increase 2 capsules by mouth night (600mg ) Rx sent to [facility] 5)  Depression - take effexor 75mg  by mouth daily for 1 week then increase to 75mg  - 2 capsules by mouth daily 6)  MRI will be schedule and you will be notified of the time/date of the appt Prescriptions: NEURONTIN 300 MG CAPS (GABAPENTIN) 1 capsule by mouth daily x 3 days then increase to 2 capsules by mouth nightly  #60 x 0   Entered and Authorized by:   Lehman Prom FNP   Signed by:   Lehman Prom FNP on 01/28/2010   Method used:   Faxed to ...       [facility] (retail)       [address] [place], [place]  [address]       Ph: [phone]       Fax: [phone]   RxID:   [id number] NEURONTIN 300 MG CAPS (GABAPENTIN) 1 capsule  by mouth daily x 3 days then increase to 2 capsules by mouth nightly  #60 x 0   Entered and Authorized by:   Lehman Prom FNP   Signed by:   Lehman Prom FNP on 01/28/2010   Method used:   Print then Give to Patient   RxID:   [id number] CLONAZEPAM 0.5 MG TABS (CLONAZEPAM) One tablet by mouth two times a day as needed for nerves  #60 x 0   Entered and Authorized by:   Lehman Prom FNP   Signed by:   Lehman Prom FNP on 01/28/2010   Method used:   Print then Give to Patient   RxID:   [id number] EFFEXOR XR 75 MG XR24H-CAP (VENLAFAXINE HCL) One tablet by mouth daily for 1 week then increase to 2 capsules by mouth daily  #60 x 1   Entered and Authorized by:   Lehman Prom FNP   Signed by:   Lehman Prom FNP on 01/28/2010   Method used:   Faxed to ...       [facility] (retail)       [address] [place], [place]  [address]       Ph: [phone]       Fax: [phone]   RxID:   [id number] NEXIUM 40 MG  CPDR (ESOMEPRAZOLE MAGNESIUM) 1 tablet by mouth daily for stomach  #30 x 11   Entered and Authorized by:   Lehman Prom FNP   Signed by:   Lehman Prom FNP on 01/28/2010   Method used:   Faxed to ...       [facility] (retail)       [address] [place], [place]  [address]       Ph: [phone]       Fax: [phone]   RxID:   [id number]    Orders Added: 1)  Flu Vaccine 37yrs + [62831] 2)  Admin 1st Vaccine [90471] 3)  Est. Patient Level III [51761] 4)  MRI without Contrast [MRI w/o Contrast]   Immunizations Administered:  Influenza Vaccine # 1:    Vaccine Type: Fluvax 3+    Site: left deltoid    Mfr: GlaxoSmithKline    Dose: 0.5 ml     Route: IM    Given by: Levon Hedger    Exp. Date: 09/21/2010    Lot #: [id number]    VIS given: 10/16/09 version given January 28, 2010.  Flu Vaccine Consent Questions:    Do you have a history of severe allergic reactions to this vaccine? no    Any prior history of allergic reactions to egg and/or gelatin? no    Do you have a sensitivity to the preservative Thimersol? no    Do you have a past history of Guillan-Barre Syndrome? no    Do you currently have an acute febrile illness? no    Have you ever had a severe reaction to latex? no    Vaccine information given and explained to patient? yes    Are you currently pregnant? no    ndc  [id number]  Immunizations Administered:  Influenza Vaccine # 1:    Vaccine Type: Fluvax 3+    Site: left deltoid    Mfr: GlaxoSmithKline    Dose: 0.5 ml    Route: IM    Given by: Levon Hedger    Exp. Date: 09/21/2010  Lot #: [id number]    VIS given: 10/16/09 version given January 28, 2010.   Prevention & Chronic Care Immunizations   Influenza vaccine: Fluvax 3+  (01/28/2010)    Tetanus booster: 04/05/2007: Tdap    Pneumococcal vaccine: Not documented  Other Screening   Pap smear: NEGATIVE FOR INTRAEPITHELIAL LESIONS OR MALIGNANCY.  (04/12/2009)   Smoking status: quit  (01/28/2010)   Nursing Instructions: Give Flu vaccine today

## 2010-04-25 NOTE — Progress Notes (Signed)
Summary: TALK TO DOCTOR ABOUT MEDS  Phone Note Call from Patient Call back at [phone]   Caller: Patient Reason for Call: Talk to Doctor Summary of Call: PT WENT TO ANOTHER DOCTOR THEY TOOK HER OFF THE ZOLOF, AND RAISED UP THE EFFOXER, AND SHE WANTS TO SPEAK TO DR.[name] TO SEE IF THEY CAN GET HER BACK ON THAT MEDS AND TALK TO [name] , ALSO REFERRAL TO GET HER LIVER TESTED. Initial call taken by: Oscar La,  November 27, 2009 9:07 AM  Follow-up for Phone Call        FORWARD TO [name], FNP Follow-up by: Levon Hedger,  November 27, 2009 10:25 AM  Additional Follow-up for Phone Call Additional follow up Details #1::        Pt has established at another provider and office.  Assuming she has insurance now. she will need to speak with that office directly about medications changes.  will NOT change medications after she has already established at another office and they have established a plan for the pt. She can have her records from here sent to that office for continuity of care - **she will need to sign a form to permit this office to send** Additional Follow-up by: Lehman Prom FNP,  November 27, 2009 10:37 AM    Additional Follow-up for Phone Call Additional follow up Details #2::    pt informed of above information.  She is saying that she is not going to continue to see that Dr. because she said  that she does not have [place] insurance and she is in the process of getting a new orange card so that she can come back to see you.  She says that she was told that she can get 1 visit to see you without her orange card and she wants to know if she can get an MRI scheduled. Levon Hedger  November 27, 2009 3:37 PM   Additional Follow-up for Phone Call Additional follow up Details #3:: Details for Additional Follow-up Action Taken: Will forward to JM to sort out n.martin,fnp  November 28, 2009  8:23 AM  pt into the office today n.martin, fnp January 28, 2010  4:55  PM

## 2010-04-25 NOTE — Progress Notes (Signed)
Summary: EFFEXOR SENT OT WRONG PHARM  Phone Note Call from Patient Call back at Home Phone [phone] Call back at [phone]   Caller: Patient Reason for Call: Refill Medication Summary of Call: [name] PT. MS Cabiness CALLED AND SAYS THAT HER EFFEXOR WAS SENT TO [facility] [address] AND IT SHOULD HAVE BEEN SENT TO [facility].  ONLY HER MIGRAINE MED IS TO BE SENT TO [facility] AND THE [facility].  MS Machia SAYS TO CALL HER ONCE ITS BEEN DONE. Initial call taken by: Leodis Rains,  April 05, 2010 11:35 AM  Follow-up for Phone Call        [facility] [facility] advised of transfer of Effexor.  Pt. notified.  Dutch Quint RN  April 08, 2010 4:58 PM

## 2010-04-25 NOTE — Assessment & Plan Note (Signed)
Summary: Depression/Anxiety   Vital Signs:  Patient profile:   35 year old female Menstrual status:  irregular Height:      61.25 inches Weight:      168 pounds BMI:     31.60 Temp:     97.6 degrees F oral Pulse rate:   74 / minute Pulse rhythm:   regular Resp:     18 per minute BP sitting:   139 / 101  (left arm) Cuff size:   large  Vitals Entered By: [place] Shannon (August 24, 2009 8:38 AM) CC: f/u..., Depression Is Patient Diabetic? No Pain Assessment Patient in pain? no       Does patient need assistance? Functional Status Self care Ambulation Normal   CC:  f/u... and Depression.  History of Present Illness:  Pt into the office to f/u with complaints of increased anxiety and mood. Pt reports that she is having an increase in anxiety especially when she is out .Marland Kitchen.. she tries to rush and get back home "I feel like I don't have enough time to do anything"  Pt was already on zoloft and was started on effexor during her last visit, which has helped some but not quite enough.  Pt has seen Aquilla Solian (however this provider did speak with A.V. and pt does not have an open file)  Menses - Pt notes that during her menses she has really bad cramps that start in her back and decrease down to her legs.  Depression History:      The patient is having a depressed mood most of the day and has a diminished interest in her usual daily activities.  Positive alarm features for depression include fatigue (loss of energy) and impaired concentration (indecisiveness).  However, she denies recurrent thoughts of death or suicide.  Positive alarm features for a manic disorder include abnormally & persistently irritable mood and flight of ideas.        The patient denies that she feels like life is not worth living, denies that she wishes that she were dead, and denies that she has thought about ending her life.         Depression Treatment History:  Prior Medication Used:   Start Date:  Assessment of Effect:   Comments:  Zoloft (sertraline)     --     some improvement     -- Effexor XR (venlafaxine-XR)   --     some improvement     started   Current Medications (verified): 1)  Zoloft 100 Mg Tabs (Sertraline Hcl) .Marland Kitchen.. 1 Tablet By Mouth Daily 2)  Soma 350 Mg  Tabs (Carisoprodol) .Marland Kitchen.. 1 Tablet By Mouth At Bedtime As Needed For Spasms 3)  Nexium 40 Mg  Cpdr (Esomeprazole Magnesium) .Marland Kitchen.. 1 Tablet By Mouth Daily For Stomach 4)  Imitrex 50 Mg Tabs (Sumatriptan Succinate) .... One Tablet By Mouth At Onset of Headache 5)  Triamcinolone Acetonide 0.1 % Oint (Triamcinolone Acetonide) .Marland Kitchen.. 1 Application Topically Two Times A Day To Affected Area 6)  Nabumetone 500 Mg Tabs (Nabumetone) .... One Tablet By Mouth Two Times A Day As Needed For Pain 7)  Tramadol Hcl 50 Mg Tabs (Tramadol Hcl) .... Take 1/2 To 1 By Mouth Three Times A Day As Needed For Pain 8)  Clonazepam 0.5 Mg Tabs (Clonazepam) .... One Tablet By Mouth Daily As Needed For Anxiety 9)  Effexor Xr 75 Mg Xr24h-Cap (Venlafaxine Hcl) .... One Tablet By Mouth Daily For Depression  Allergies (verified):  No Known Drug Allergies  Review of Systems CV:  Denies chest pain or discomfort. Resp:  Denies cough. GI:  Denies abdominal pain, nausea, and vomiting. Psych:  Complains of anxiety, depression, and irritability.  Physical Exam  General:  alert.   Head:  normocephalic.   Lungs:  normal breath sounds.   Heart:  normal rate and regular rhythm.   Abdomen:  BS x 4 Msk:  up to the exam table Neurologic:  alert & oriented X3.     Impression & Recommendations:  Problem # 1:  DEPRESSION (ICD-311)  The following medications were removed from the medication list:    Clonazepam 0.5 Mg Tabs (Clonazepam) ..... One tablet by mouth daily as needed for anxiety Her updated medication list for this problem includes:    Zoloft 100 Mg Tabs (Sertraline hcl) .Marland Kitchen... 1 tablet by mouth daily    Effexor Xr 75 Mg Xr24h-cap (Venlafaxine hcl)  ..... One tablet by mouth daily for depression    Clonazepam 0.5 Mg Tabs (Clonazepam) ..... One tablet by mouth daily as needed for anxiety  Problem # 2:  ANXIETY (ICD-300.00) Pt has an appt with Aquilla Solian on Monday - advised her to keep appt pt will likely need referral to the [facility] center The following medications were removed from the medication list:    Clonazepam 0.5 Mg Tabs (Clonazepam) ..... One tablet by mouth daily as needed for anxiety Her updated medication list for this problem includes:    Zoloft 100 Mg Tabs (Sertraline hcl) .Marland Kitchen... 1 tablet by mouth daily    Effexor Xr 75 Mg Xr24h-cap (Venlafaxine hcl) ..... One tablet by mouth daily for depression    Clonazepam 0.5 Mg Tabs (Clonazepam) ..... One tablet by mouth daily as needed for anxiety  Complete Medication List: 1)  Zoloft 100 Mg Tabs (Sertraline hcl) .Marland Kitchen.. 1 tablet by mouth daily 2)  Soma 350 Mg Tabs (Carisoprodol) .Marland Kitchen.. 1 tablet by mouth at bedtime as needed for spasms 3)  Nexium 40 Mg Cpdr (Esomeprazole magnesium) .Marland Kitchen.. 1 tablet by mouth daily for stomach 4)  Imitrex 50 Mg Tabs (Sumatriptan succinate) .... One tablet by mouth at onset of headache 5)  Triamcinolone Acetonide 0.1 % Oint (Triamcinolone acetonide) .Marland Kitchen.. 1 application topically two times a day to affected area 6)  Nabumetone 500 Mg Tabs (Nabumetone) .... One tablet by mouth two times a day as needed for pain 7)  Tramadol Hcl 50 Mg Tabs (Tramadol hcl) .... Take 1/2 to 1 by mouth three times a day as needed for pain 8)  Effexor Xr 75 Mg Xr24h-cap (Venlafaxine hcl) .... One tablet by mouth daily for depression 9)  Clonazepam 0.5 Mg Tabs (Clonazepam) .... One tablet by mouth daily as needed for anxiety  Patient Instructions: 1)  You will need to follow up with Aquilla Solian - She will then refer you to the most appropriate resource to ensure you have been properly diagnosed and that you are on the right medications. 2)  Remember you will need additional imaging  of your liver in August. Prescriptions: CLONAZEPAM 0.5 MG TABS (CLONAZEPAM) One tablet by mouth daily as needed for anxiety  #20 x 0   Entered and Authorized by:   Lehman Prom FNP   Signed by:   Lehman Prom FNP on 08/24/2009   Method used:   Print then Give to Patient   RxID:   [id number]

## 2010-04-25 NOTE — Letter (Signed)
Summary: REFERRAL/RHEUMATOLOGY//APPT DATE & TIME  REFERRAL/RHEUMATOLOGY//APPT DATE & TIME   Imported By: Arta Bruce 04/05/2010 14:05:29  _____________________________________________________________________  External Attachment:    Type:   Image     Comment:   External Document

## 2010-04-25 NOTE — Letter (Signed)
Summary: test order form//mri/with /without contrast  test order form//mri/with /without contrast   Imported By: Arta Bruce 02/12/2010 15:37:09  _____________________________________________________________________  External Attachment:    Type:   Image     Comment:   External Document

## 2010-04-25 NOTE — Letter (Signed)
Summary: Handout Printed  Printed Handout:  - Hypertriglyceridemia, Triglycerides, High Blood Levels

## 2010-04-25 NOTE — Progress Notes (Signed)
Summary: HEADACHED HAVE STARTED [name]  Phone Note Call from Patient Call back at Home Phone [phone]   Summary of Call: Katie [name] PT. Katie [name]. IMITREX ISN'NT HELPING AND SHE DOESN'T FEEL RIGHT. Initial call taken by: Leodis Rains,  October 03, 2009 11:13 AM  Follow-up for Phone Call        No answer -- unable to leave message.  Dutch Quint RN  October 09, 2009 11:42 AM  No answer -- unable to leave message.  Dutch Quint RN  October 10, 2009 11:44 AM  No answer -- unable to leave message.  Dutch Quint RN  October 12, 2009 2:13 PM   Additional Follow-up for Phone Call Additional follow up Details #1::        Heat can definately trigger headache so she needs to avoid direct sunlight and drink plenty of fluids.  Pt was also supposed to have an appt with Aquilla Solian on site Child psychotherapist for anxiety issues.  i don't see that she has followed through with this yet she has requested her clonazepam.  encourage pt to make appt with Marchelle Folks. Additional Follow-up by: Lehman Prom FNP,  October 12, 2009 3:31 PM    Additional Follow-up for Phone Call Additional follow up Details #2::    States she thinks that her headaches are linked to her cycles.  Imitrex isn't helping -- starts in the back of her head on either side and goes directly behind her eyes.  Sometimes has to go to sleep since nothing really helps the tension.  Also, states the shoulder pain that she was having has traveled to your elbows, like a pulled or strained feeling, can't lift or carry things.  Shoulder still hurts, but elbow is hurting like shoulder did in the beginning, tramadol and nabutemone aren't effective.  Advised of provider's response and recommendations.  Verbalized understanding re direct sunlight and states that she does drink plenty of fluids, but does go out in direct sunlight.  States that she did see Aquilla Solian and had to cancel due to transportation issues.  High anxiety due  to family and financial issues is causing extreme stress.  States it is very bad -- needs her clonazepam.  Would like provider to increase clonazepam; bipolar issues run in family and she is having difficulty interacting with her family.  The pain and anxiety are becoming increasingly difficult to handle.  States she will speak with provider directly if needed, to explain how badly she needs her medication. Follow-up by: Dutch Quint RN,  October 15, 2009 3:37 PM  Additional Follow-up for Phone Call Additional follow up Details #3:: Details for Additional Follow-up Action Taken: I have already spoken with pt regarding her anxiety and family history of bipolar. That is why she was to come see Aquilla Solian for an assessment.  Bipolar is not necessarily her problem just because her mother has it.  we agreed at her last visit with me that she needed additional testing and possible referral to determine the exact diagnosis. This is likely why her body feels so horrible because her mind has constantly racing thoughts. Will not increase clonazepam as pt needs to fulfil her obligation and get assessement by Aquilla Solian.  This was also a stipulation as indicated when she requested to continue being a pt here that she would f/u on mental health issues. I have refilled clonazepam at current dose - 20 per month n.martin,fnp October 15, 2009  4:19 PM  Pt. advised of provider's response and refill.  States she will keep appt. on 10/30/09 with Marchelle Folks for follow-up. Additional Follow-up by: Dutch Quint RN,  October 16, 2009 9:43 AM

## 2010-04-25 NOTE — Miscellaneous (Signed)
Summary: Controlled Substances Contract  Controlled Substances Contract   Imported By: Lester Clio 10/29/2009 10:45:42  _____________________________________________________________________  External Attachment:    Type:   Image     Comment:   External Document

## 2010-04-25 NOTE — Progress Notes (Signed)
Summary: Clonazepam refill  Phone Note Call from Patient Call back at Home Phone [phone] Call back at [phone]   Summary of Call: Pt needs more colonepam refills.  Pt wants to know if the provider can give her more than 20 pills because it help her to avoid pannic attack.  When pt goes out she start having pannic because it seems that she cannot do too many things around the house.  [facility] Initial call taken by: Manon Hilding,  June 19, 2009 8:35 AM  Follow-up for Phone Call        clonazepam last filled 05/29/09 #20 forward to N. [name],fnp Follow-up by: Levon Hedger,  June 19, 2009 11:40 AM  Additional Follow-up for Phone Call Additional follow up Details #1::        Pt is on effexor and zoloft which should be helping too with anxiety and depression.  she may need an increase in effexor if still having some anxiety. do not want to increase her dose of clonzepam because pt should not be taking this daily it is only as needed.  20 per month is a good number.  Additional Follow-up by: Lehman Prom FNP,  June 19, 2009 12:06 PM    Additional Follow-up for Phone Call Additional follow up Details #2::    called [phone] left message with Angie pt's mother for her to return call to the office. Levon Hedger  June 20, 2009 12:40 PM  spoke with pt and she is informed of above information.  Pt says that she has an appt on 06/29/09 and will discuss with you about the Effexor.  Follow-up by: Levon Hedger,  June 20, 2009 2:33 PM  Additional Follow-up for Phone Call Additional follow up Details #3:: Details for Additional Follow-up Action Taken: noted Additional Follow-up by: Lehman Prom FNP,  June 20, 2009 3:02 PM

## 2010-04-25 NOTE — Progress Notes (Signed)
Summary: Rheumatology appt  Phone Note Outgoing Call   Summary of Call: Refer pt to rheumatologist  reason - pain in multiple joints pt is aware that this is an out of pocket expense please call around and see what the co-payments are and let pt know the option Initial call taken by: Lehman Prom FNP,  April 01, 2010 6:46 PM  Follow-up for Phone Call        I talk to Katie [name] and I told her that is a Rheumatologist  clinic in [place] charge $250 so the pt said that she can afford and to let you know that so I suggest to send it to [facility] and she said ok .  Follow-up by: Cheryll Dessert,  April 02, 2010 9:48 AM     Appended Document: Rheumatology appt Pt has an appt 07-09-10 @ 2:30 pm pt aware of her appt $20 copay Cheryll Dessert 04-02-10 @ 10:30am

## 2010-04-25 NOTE — Letter (Signed)
Summary: Lipid Letter  [facility]  [address] [place], [place] [address]   Phone: [phone]  Fax: [phone]    07/23/2009  Ciara Kagan [address] [place], [place]  [address]  Dear Elease Hashimoto:  We have carefully reviewed your last lipid profile from 07/17/2009 and the results are noted below with a summary of recommendations for lipid management.    Cholesterol:       150     Goal: less than 200   HDL "good" Cholesterol:   29     Goal: greater than 40   LDL "bad" Cholesterol:   73     Goal: less than 130   Triglycerides:       240     Goal: less than 150    Labs ok during your recent office visit except your triglycerides are 240. This value should be less than 150.  Be sure to monitor fried fatty foods. Also eat "fat free" food selections.       Current Medications: 1)    Zoloft 100 Mg Tabs (Sertraline hcl) .Marland Kitchen.. 1 tablet by mouth daily 2)    Soma 350 Mg  Tabs (Carisoprodol) .Marland Kitchen.. 1 tablet by mouth at bedtime as needed for spasms 3)    Nexium 40 Mg  Cpdr (Esomeprazole magnesium) .Marland Kitchen.. 1 tablet by mouth daily for stomach 4)    Imitrex 50 Mg Tabs (Sumatriptan succinate) .... One tablet by mouth at onset of headache 5)    Triamcinolone Acetonide 0.1 % Oint (Triamcinolone acetonide) .Marland Kitchen.. 1 application topically two times a day to affected area 6)    Nabumetone 500 Mg Tabs (Nabumetone) .... One tablet by mouth two times a day as needed for pain 7)    Tramadol Hcl 50 Mg Tabs (Tramadol hcl) .... Take 1/2 to 1 by mouth three times a day as needed for pain 8)    Clonazepam 0.5 Mg Tabs (Clonazepam) .... One tablet by mouth daily as needed for anxiety 9)    Effexor Xr 75 Mg Xr24h-cap (Venlafaxine hcl) .... One tablet by mouth daily for depression  If you have any questions, please call. We appreciate being able to work with you.   Sincerely,    [facility] Lehman Prom FNP

## 2010-04-25 NOTE — Assessment & Plan Note (Signed)
Summary: discuss meds/SD   Vital Signs:  Patient profile:   35 year old female Menstrual status:  irregular LMP:     11/19/2009 Height:      61.25 inches (155.57 cm) Weight:      163.75 pounds (74.43 kg) BMI:     30.80 O2 Sat:      97 % on Room air Temp:     98.6 degrees F (37.00 degrees C) oral Pulse rate:   81 / minute Pulse rhythm:   regular Resp:     16 per minute BP sitting:   130 / 80  (left arm) Cuff size:   regular  Vitals Entered By: Lucious Groves CMA (November 23, 2009 9:13 AM)  Nutrition Counseling: Patient's BMI is greater than 25 and therefore counseled on weight management options.  O2 Flow:  Room air CC: Discuss meds./kb Is Patient Diabetic? No Pain Assessment Patient in pain? yes     Location: hip/leg Intensity: 6 Type: aching Comments Patient also notes that she is having leg/hip pain and would like to discuss recent GYN visit. Patient states that she does not take Zoloft anymore, nor does she have the pain patches due to the cost. Lucious Groves CMA  November 23, 2009 9:13 AM  LMP (date): 11/19/2009 LMP - Character: heavy     Enter LMP: 11/19/2009 Last PAP Result NEGATIVE FOR INTRAEPITHELIAL LESIONS OR MALIGNANCY.   Primary Care Provider:  Etta Grandchild MD  CC:  Discuss meds./kb.  History of Present Illness: She returns for f/up and she reports chronic pain in her right shoulder that she says was previously diagnosed as bursitis/tendonitis and she says that the pain has not gone away despite her report that she has done PT, exercises, and nsaids- she indicates that she would liketo take narcotics for this pain and other pains.  She also tells me today that she has successully stopped taking Zoloft and is doing well on Effexor.  She continues to have dysmenorrhea and needs a GYN referral- she will not consider swithing antidepressants to prozac to see if that would help. She says that she has urinary hesitancy during her menstrual cycles but she  urinates fine otherwise.  Current Medications (verified): 1)  Zoloft 100 Mg Tabs (Sertraline Hcl) .Marland Kitchen.. 1 Tablet By Mouth Daily 2)  Soma 350 Mg  Tabs (Carisoprodol) .Marland Kitchen.. 1 Tablet By Mouth At Bedtime As Needed For Spasms 3)  Nexium 40 Mg  Cpdr (Esomeprazole Magnesium) .Marland Kitchen.. 1 Tablet By Mouth Daily For Stomach 4)  Nabumetone 500 Mg Tabs (Nabumetone) .... One Tablet By Mouth Two Times A Day As Needed For Pain 5)  Effexor Xr 75 Mg Xr24h-Cap (Venlafaxine Hcl) .... One Tablet By Mouth Daily For Depression 6)  Clonazepam 0.5 Mg Tabs (Clonazepam) .... One Po Bid  As Needed For Anxiety 7)  Butrans 10 Mcg/hr Ptwk (Buprenorphine) .... Apply One Each Week, Remove The Old One  Allergies (verified): No Known Drug Allergies  Past History:  Past Medical History: Last updated: 03/04/2007 Current Problems:  PALPITATIONS (ICD-785.1) NEPHROLITHIASIS, HX OF (ICD-V13.01) ANXIETY (ICD-300.00) DEPRESSION (ICD-311)  Past Surgical History: Last updated: 05/11/2008 Tubal ligation 03/2008  Family History: Last updated: 06/29/2009 Family History Thyroid disease - maternal grandmother bipolar - mother depression - mother  Social History: Last updated: 03/04/2007 Married 2 children Alcohol use-yes ( only on the weekends - 14 beers) Former Smoker (quit 2007) Drug use-no  Risk Factors: Alcohol Use: <1 (10/25/2009) >5 drinks/d w/in last 3 months: no (10/25/2009)  Caffeine Use: 2 (03/04/2007) Exercise: yes (06/29/2009)  Risk Factors: Smoking Status: quit (10/25/2009)  Family History: Reviewed history from 06/29/2009 and no changes required. Family History Thyroid disease - maternal grandmother bipolar - mother depression - mother  Social History: Reviewed history from 03/04/2007 and no changes required. Married 2 children Alcohol use-yes ( only on the weekends - 14 beers) Former Smoker (quit 2007) Drug use-no  Review of Systems MS:  Complains of joint pain, muscle, and stiffness; denies  joint redness, joint swelling, loss of strength, low back pain, mid back pain, muscle aches, and thoracic pain. Psych:  Complains of anxiety; denies alternate hallucination ( auditory/visual), depression, easily angered, easily tearful, irritability, mental problems, panic attacks, sense of great danger, suicidal thoughts/plans, thoughts of violence, unusual visions or sounds, and thoughts /plans of harming others.  Physical Exam  General:  alert, well-developed, well-nourished, well-hydrated, appropriate dress, normal appearance, healthy-appearing, cooperative to examination, good hygiene, and overweight-appearing.   Head:  normocephalic, atraumatic, no abnormalities observed, and no abnormalities palpated.   Mouth:  Oral mucosa and oropharynx without lesions or exudates.  Teeth in good repair. Neck:  supple, full ROM, no masses, no thyromegaly, no JVD, normal carotid upstroke, no carotid bruits, and no cervical lymphadenopathy.   Lungs:  normal respiratory effort, no intercostal retractions, no accessory muscle use, normal breath sounds, no dullness, no fremitus, no crackles, and no wheezes.   Heart:  normal rate, regular rhythm, no murmur, no gallop, no rub, and no JVD.   Abdomen:  soft, non-tender, normal bowel sounds, no distention, no masses, no guarding, no rigidity, no rebound tenderness, no abdominal hernia, no hepatomegaly, and no splenomegaly.   Msk:  normal ROM, no joint tenderness, no joint swelling, no joint warmth, no redness over joints, no joint deformities, no joint instability, no crepitation, and no muscle atrophy.   Pulses:  R and L carotid,radial,femoral,dorsalis pedis and posterior tibial pulses are full and equal bilaterally Extremities:  No clubbing, cyanosis, edema, or deformity noted with normal full range of motion of all joints.   Neurologic:  No cranial nerve deficits noted. Station and gait are normal. Plantar reflexes are down-going bilaterally. DTRs are symmetrical  throughout. Sensory, motor and coordinative functions appear intact. Skin:  Intact without suspicious lesions or rashes Cervical Nodes:  no anterior cervical adenopathy and no posterior cervical adenopathy.   Psych:  Oriented X3, memory intact for recent and remote, normally interactive, good eye contact, not anxious appearing, not depressed appearing, not agitated, and not suicidal.     Impression & Recommendations:  Problem # 1:  SHOULDER PAIN, RIGHT (ICD-719.41) Assessment Unchanged  The following medications were removed from the medication list:    Soma 350 Mg Tabs (Carisoprodol) .Marland Kitchen... 1 tablet by mouth at bedtime as needed for spasms    Butrans 10 Mcg/hr Ptwk (Buprenorphine) .Marland Kitchen... Apply one each week, remove the old one Her updated medication list for this problem includes:    Nabumetone 500 Mg Tabs (Nabumetone) ..... One tablet by mouth two times a day as needed for pain  Orders: Orthopedic Referral (Ortho)  Problem # 2:  DYSMENORRHEA (ICD-625.3) Assessment: Deteriorated  Orders: Gynecologic Referral (Gyn)  Problem # 3:  DEPRESSION (ICD-311) Assessment: Improved  The following medications were removed from the medication list:    Zoloft 100 Mg Tabs (Sertraline hcl) .Marland Kitchen... 1 tablet by mouth daily Her updated medication list for this problem includes:    Effexor Xr 75 Mg Xr24h-cap (Venlafaxine hcl) ..... One tablet by mouth daily  for depression    Clonazepam 0.5 Mg Tabs (Clonazepam) ..... One po bid  as needed for anxiety  Discussed treatment options, including trial of antidpressant medication. Will refer to behavioral health. Follow-up call in in 24-48 hours and recheck in 2 weeks, sooner as needed. Patient agrees to call if any worsening of symptoms or thoughts of doing harm arise. Verified that the patient has no suicidal ideation at this time.   Complete Medication List: 1)  Nexium 40 Mg Cpdr (Esomeprazole magnesium) .Marland Kitchen.. 1 tablet by mouth daily for stomach 2)   Nabumetone 500 Mg Tabs (Nabumetone) .... One tablet by mouth two times a day as needed for pain 3)  Effexor Xr 75 Mg Xr24h-cap (Venlafaxine hcl) .... One tablet by mouth daily for depression 4)  Clonazepam 0.5 Mg Tabs (Clonazepam) .... One po bid  as needed for anxiety  Patient Instructions: 1)  Please schedule a follow-up appointment in 4 months. 2)  It is important that you exercise regularly at least 20 minutes 5 times a week. If you develop chest pain, have severe difficulty breathing, or feel very tired , stop exercising immediately and seek medical attention. 3)  You need to lose weight. Consider a lower calorie diet and regular exercise.  Prescriptions: EFFEXOR XR 75 MG XR24H-CAP (VENLAFAXINE HCL) One tablet by mouth daily for depression  #30 x 11   Entered and Authorized by:   Etta Grandchild MD   Signed by:   Etta Grandchild MD on 11/23/2009   Method used:   Electronically to        [facility] [address] (retail)       [address]       [place], [place]  [id number]       Ph: [phone]       Fax: [phone]   RxID:   [id number]

## 2010-05-01 NOTE — Progress Notes (Signed)
Summary: Refills  Phone Note Refill Request   Refills Requested: Medication #1:  CLONAZEPAM 0.5 MG TABS One tablet by mouth two times a day as needed for nerves  Medication #2:  BUTALBITAL-APAP-CAFFEINE 50-325-40 MG TABS One tablet by mouth daily as needed for headache [facility]  Initial call taken by: Armenia Shannon,  April 23, 2010 12:36 PM  Follow-up for Phone Call        Rx done - notify pt to check with pharmacy this afternoon Follow-up by: Lehman Prom FNP,  April 24, 2010 12:36 PM  Additional Follow-up for Phone Call Additional follow up Details #1::        Left message on answering machine for pt. to return call.  Dutch Quint RN  April 24, 2010 4:19 PM  Pt. notified.  Dutch Quint RN  April 25, 2010 5:20 PM     Prescriptions: CLONAZEPAM 0.5 MG TABS (CLONAZEPAM) One tablet by mouth two times a day as needed for nerves  #60 x 0   Entered and Authorized by:   Lehman Prom FNP   Signed by:   Lehman Prom FNP on 04/24/2010   Method used:   Printed then faxed to ...       [id number]       [address]       [place], [place]  [address]       Ph: [phone]       Fax: [phone]   RxID:   [id number]

## 2010-06-06 ENCOUNTER — Emergency Department (HOSPITAL_COMMUNITY)
Admission: EM | Admit: 2010-06-06 | Discharge: 2010-06-06 | Disposition: A | Discharge status: Home / Self Care | Payer: Self-pay | Attending: Emergency Medicine | Admitting: Emergency Medicine

## 2010-06-06 DIAGNOSIS — M542 Cervicalgia: Secondary | ICD-10-CM | POA: Insufficient documentation

## 2010-06-06 DIAGNOSIS — Z79899 Other long term (current) drug therapy: Secondary | ICD-10-CM | POA: Insufficient documentation

## 2010-06-06 DIAGNOSIS — K219 Gastro-esophageal reflux disease without esophagitis: Secondary | ICD-10-CM | POA: Insufficient documentation

## 2010-06-06 DIAGNOSIS — X58XXXA Exposure to other specified factors, initial encounter: Secondary | ICD-10-CM | POA: Insufficient documentation

## 2010-06-06 DIAGNOSIS — F341 Dysthymic disorder: Secondary | ICD-10-CM | POA: Insufficient documentation

## 2010-06-06 DIAGNOSIS — S139XXA Sprain of joints and ligaments of unspecified parts of neck, initial encounter: Secondary | ICD-10-CM | POA: Insufficient documentation

## 2010-06-06 DIAGNOSIS — G8929 Other chronic pain: Secondary | ICD-10-CM | POA: Insufficient documentation

## 2010-06-07 LAB — URINE MICROSCOPIC-ADD ON

## 2010-06-07 LAB — URINALYSIS, ROUTINE W REFLEX MICROSCOPIC
Bilirubin Urine: NEGATIVE
Protein, ur: NEGATIVE mg/dL
Urobilinogen, UA: 1 mg/dL (ref 0.0–1.0)

## 2010-06-10 ENCOUNTER — Encounter: Payer: Self-pay | Admitting: Nurse Practitioner

## 2010-06-10 ENCOUNTER — Encounter (INDEPENDENT_AMBULATORY_CARE_PROVIDER_SITE_OTHER): Payer: Self-pay | Admitting: Nurse Practitioner

## 2010-06-10 DIAGNOSIS — M436 Torticollis: Secondary | ICD-10-CM

## 2010-06-10 HISTORY — DX: Torticollis: M43.6

## 2010-06-20 NOTE — Letter (Signed)
Summary: Handout Printed  Printed Handout:  - Torticollis, Adult

## 2010-06-20 NOTE — Assessment & Plan Note (Signed)
Summary: Neck Pain   Vital Signs:  Patient profile:   35 year old female Menstrual status:  irregular LMP:     05/13/2010 Weight:      170.31 pounds Temp:     98.3 degrees F oral Pulse rate:   105 / minute Pulse rhythm:   regular Resp:     16 per minute BP sitting:   158 / 105  (left arm) Cuff size:   regular  Vitals Entered By: Hale Drone CMA (June 10, 2010 3:14 PM) CC: Review meds... Nerve medication is not helping... Needing refills on nexium and Effexor... Having right shoulder pain since Thursday. Went to [facility] hosp. for pain... Would like triglycerides checked.  Is Patient Diabetic? No Pain Assessment Patient in pain? yes       Does patient need assistance? Functional Status Self care Ambulation Normal LMP (date): 05/13/2010 LMP - Character: heavy     Enter LMP: 05/13/2010 Last PAP Result NEGATIVE FOR INTRAEPITHELIAL LESIONS OR MALIGNANCY.   Primary Care Provider:  Lehman Prom FNP  CC:  Review meds... Nerve medication is not helping... Needing refills on nexium and Effexor... Having right shoulder pain since Thursday. Went to [facility] hosp. for pain... Would like triglycerides checked. Marland Kitchen  History of Present Illness:  Pt into the office for f/u on ER visit - 06/06/2010 (visit reviewed) Dx with muscle strain of the neck Pt was given valium and percocet (12 of each)  in the ER.  Pt presents today with about 4 tablets of each that remain Pt woke on the day of presentation with excruciating pain.   Denies any trauma but admits that she restarted her exerise routine 2 weeks ago. Pt has tried some ROM techniques without resolution of symptoms  Full ER visit reviewed  Current Medications (verified): 1)  Nexium 40 Mg  Cpdr (Esomeprazole Magnesium) .Marland Kitchen.. 1 Tablet By Mouth Daily For Stomach 2)  Effexor Xr 75 Mg Xr24h-Cap (Venlafaxine Hcl) .... One Tablet By Mouth Daily For 1 Week Then Increase To 2 Capsules By Mouth Daily 3)  Clonazepam 0.5 Mg Tabs (Clonazepam) .... One  Tablet By Mouth Two Times A Day As Needed For Nerves 4)  Neurontin 300 Mg Caps (Gabapentin) .... 2 Capsules By Mouth Nightly 5)  Butalbital-Apap-Caffeine 50-325-40 Mg Tabs (Butalbital-Apap-Caffeine) .... One Tablet By Mouth Daily As Needed For Headache 6)  Nabumetone 500 Mg Tabs (Nabumetone) .... One Tablet By Mouth Two Times A Day As Needed For Pain  Allergies (verified): No Known Drug Allergies  Review of Systems General:  Denies fever. CV:  Denies chest pain or discomfort. Resp:  Denies cough. GI:  Denies abdominal pain, nausea, and vomiting. MS:  Complains of muscle; right neck pain.  Physical Exam  General:  alert.   Head:  normocephalic.   Neck:  decreased ROM.   Lungs:  normal breath sounds.   Heart:  normal rate and regular rhythm.   Abdomen:  normal bowel sounds.   Msk:  neck - decreased range of motion tenderness with palpation to left trapezius Neurologic:  alert & oriented X3.     Impression & Recommendations:  Problem # 1:  TORTICOLLIS (ICD-723.5) handout given will give pt a few valium for the next week. pt to also take anti-inflammatory (nabumetone)  Problem # 2:  ANXIETY (ICD-300.00) will refill meds advise dpt NOT to take both the clonzepam and diazepam together.  Her updated medication list for this problem includes:    Effexor Xr 75 Mg Xr24h-cap (Venlafaxine hcl) .Marland KitchenMarland KitchenMarland KitchenMarland Kitchen  One tablet by mouth daily for 1 week then increase to 2 capsules by mouth daily    Clonazepam 0.5 Mg Tabs (Clonazepam) ..... One tablet by mouth two times a day as needed for nerves    Diazepam 5 Mg Tabs (Diazepam) ..... One tablet by mouth two times a day for neck  Complete Medication List: 1)  Nexium 40 Mg Cpdr (Esomeprazole magnesium) .Marland Kitchen.. 1 tablet by mouth daily for stomach 2)  Effexor Xr 75 Mg Xr24h-cap (Venlafaxine hcl) .... One tablet by mouth daily for 1 week then increase to 2 capsules by mouth daily 3)  Clonazepam 0.5 Mg Tabs (Clonazepam) .... One tablet by mouth two times a  day as needed for nerves 4)  Neurontin 300 Mg Caps (Gabapentin) .... 2 capsules by mouth nightly 5)  Butalbital-apap-caffeine 50-325-40 Mg Tabs (Butalbital-apap-caffeine) .... One tablet by mouth daily as needed for headache 6)  Nabumetone 500 Mg Tabs (Nabumetone) .... One tablet by mouth two times a day as needed for pain 7)  Diazepam 5 Mg Tabs (Diazepam) .... One tablet by mouth two times a day for neck  Patient Instructions: 1)  Schedule a fasting lab visit - lipids. 2)  No food after midnight before this visit. 3)  Neck pain - most likely due to spasm in the neck.  Will give 1 week (one time Rx) for diazepam.  Take nabumetome 500mg  by mouth two times a day for the next week. 4)  If symptoms persist in 1 week then will need referral for therapy Prescriptions: DIAZEPAM 5 MG TABS (DIAZEPAM) One tablet by mouth two times a day for neck  #14 x 0   Entered and Authorized by:   Lehman Prom FNP   Signed by:   Lehman Prom FNP on 06/10/2010   Method used:   Print then Give to Patient   RxID:   [id number]    Orders Added: 1)  Est. Patient Level III [14782]

## 2010-07-03 LAB — POCT URINALYSIS DIP (DEVICE)
Bilirubin Urine: NEGATIVE
Glucose, UA: NEGATIVE mg/dL
Hgb urine dipstick: NEGATIVE
Ketones, ur: NEGATIVE mg/dL
pH: 7 (ref 5.0–8.0)

## 2010-07-03 LAB — POCT PREGNANCY, URINE: Preg Test, Ur: NEGATIVE

## 2010-07-08 LAB — POCT I-STAT, CHEM 8
Calcium, Ion: 1.14 mmol/L (ref 1.12–1.32)
Chloride: 105 mEq/L (ref 96–112)
Glucose, Bld: 94 mg/dL (ref 70–99)
HCT: 43 % (ref 36.0–46.0)
Hemoglobin: 14.6 g/dL (ref 12.0–15.0)
TCO2: 24 mmol/L (ref 0–100)

## 2010-07-08 LAB — URINALYSIS, ROUTINE W REFLEX MICROSCOPIC
Bilirubin Urine: NEGATIVE
Nitrite: NEGATIVE
Specific Gravity, Urine: 1.016 (ref 1.005–1.030)
Urobilinogen, UA: 0.2 mg/dL (ref 0.0–1.0)
pH: 7.5 (ref 5.0–8.0)

## 2010-07-08 LAB — CBC
HCT: 40.1 % (ref 36.0–46.0)
HCT: 41.3 % (ref 36.0–46.0)
Hemoglobin: 13.7 g/dL (ref 12.0–15.0)
MCHC: 34.3 g/dL (ref 30.0–36.0)
MCV: 93.6 fL (ref 78.0–100.0)
Platelets: 195 10*3/uL (ref 150–400)
RBC: 4.24 MIL/uL (ref 3.87–5.11)
RDW: 13 % (ref 11.5–15.5)
RDW: 12.6 % (ref 11.5–15.5)
WBC: 5.5 10*3/uL (ref 4.0–10.5)

## 2010-07-08 LAB — POCT PREGNANCY, URINE: Preg Test, Ur: NEGATIVE

## 2010-07-08 LAB — ABO/RH: ABO/RH(D): A POS

## 2010-07-08 LAB — URINE MICROSCOPIC-ADD ON

## 2010-07-08 LAB — HCG, QUANTITATIVE, PREGNANCY: hCG, Beta Chain, Quant, S: <2 m[IU]/mL (ref <5)

## 2010-08-06 NOTE — Op Note (Signed)
NAME:  Katie [name], Katie [name] NO.:  [id number]   MEDICAL RECORD NO.:  [id number]          PATIENT TYPE:  AMB   LOCATION:  SDC                           FACILITY:  [facility]   PHYSICIAN:  Ilda Mori, M.D.   DATE OF BIRTH:  28-Aug-1975   DATE OF PROCEDURE:  DATE OF DISCHARGE:                               OPERATIVE REPORT   PREOPERATIVE DIAGNOSIS:  Voluntary sterilization.   POSTOPERATIVE DIAGNOSIS:  Voluntary sterilization.   PROCEDURE:  Laparoscopic bilateral tubal cautery for sterilization.   SURGEON:  Ilda Mori, MD   ANESTHESIA:  General endotracheal.   ESTIMATED BLOOD LOSS:  Minimal.   FINDINGS:  The tubes and ovaries appeared normal bilaterally.  The  uterus appeared normal.  The pelvis was free of adhesions or any  evidence of endometriosis.   INDICATIONS:  This is a 35 year old, gravida 3, para 2-0-1-2, female,  who requested permanent sterilization.  The failure risk of 1-2 per 1000  was quoted to the patient as well as the fact that the procedure was  permanent and there were 9 permanent alternatives of long-acting birth  control methods.  The patient accepted this information and elected to  proceed.   PROCEDURE:  The patient was taken to the operating room and general  anesthesia was induced.  She was then placed in the modified dorsal  lithotomy position, and the abdomen, perineum, and vagina were prepped  and draped in sterile fashion.  A Hulka tenaculum was introduced through  the endocervical canal and fixed to the anterior lip of the cervix.  The  surgeon was re-gowned and gloved.  The bladder had been catheterized.  An incision was made at the base of the umbilicus, and a Veress needle  was introduced into the peritoneal cavity and pneumoperitoneum was  created.  A 5-mm trocar was then introduced into the pneumoperitoneum,  and a 5-mm laparoscope was intduced.  Under direct visualization,  accessory 5-mm port was placed in the suprapubic area.   The pelvis was  viewed with the findings noted above.  The left fallopian tube was  grasped with a Kleppinger forceps and elevated.  It was cauterized along  a 3-4 cm length leaving 0.5 cm to 1 cm of normal-appearing tube proximal  to the burn site.  The identical procedure was then carried out on the  right fallopian tube.  The cautery was done with bipolar cautery until  no  current was flowing through the cauterized tissue.  The procedure was  then terminated.  The gas was allowed to escape.  The incisions were  closed with Dermabond adhesive.  The patient tolerated the procedure  well and left the operating room in good condition.      Ilda Mori, M.D.  Electronically Signed     RK/MEDQ  D:  04/05/2008  T:  04/06/2008  Job:  [id number]

## 2010-08-12 ENCOUNTER — Inpatient Hospital Stay (INDEPENDENT_AMBULATORY_CARE_PROVIDER_SITE_OTHER)
Admission: RE | Admit: 2010-08-12 | Discharge: 2010-08-12 | Disposition: A | Discharge status: Home / Self Care | Payer: Self-pay | Source: Ambulatory Visit | Attending: Family Medicine | Admitting: Family Medicine

## 2010-08-12 DIAGNOSIS — J069 Acute upper respiratory infection, unspecified: Secondary | ICD-10-CM

## 2010-08-17 ENCOUNTER — Emergency Department (HOSPITAL_COMMUNITY)
Admission: EM | Admit: 2010-08-17 | Discharge: 2010-08-17 | Disposition: A | Discharge status: Home / Self Care | Payer: Self-pay | Attending: Emergency Medicine | Admitting: Emergency Medicine

## 2010-08-17 DIAGNOSIS — K219 Gastro-esophageal reflux disease without esophagitis: Secondary | ICD-10-CM | POA: Insufficient documentation

## 2010-08-17 DIAGNOSIS — G8929 Other chronic pain: Secondary | ICD-10-CM | POA: Insufficient documentation

## 2010-08-17 DIAGNOSIS — H9209 Otalgia, unspecified ear: Secondary | ICD-10-CM | POA: Insufficient documentation

## 2010-08-17 DIAGNOSIS — R51 Headache: Secondary | ICD-10-CM | POA: Insufficient documentation

## 2010-08-17 DIAGNOSIS — M549 Dorsalgia, unspecified: Secondary | ICD-10-CM | POA: Insufficient documentation

## 2010-08-17 DIAGNOSIS — J3489 Other specified disorders of nose and nasal sinuses: Secondary | ICD-10-CM | POA: Insufficient documentation

## 2010-08-17 DIAGNOSIS — R05 Cough: Secondary | ICD-10-CM | POA: Insufficient documentation

## 2010-08-17 DIAGNOSIS — R059 Cough, unspecified: Secondary | ICD-10-CM | POA: Insufficient documentation

## 2010-08-17 DIAGNOSIS — J069 Acute upper respiratory infection, unspecified: Secondary | ICD-10-CM | POA: Insufficient documentation

## 2010-08-17 DIAGNOSIS — F341 Dysthymic disorder: Secondary | ICD-10-CM | POA: Insufficient documentation

## 2010-08-17 DIAGNOSIS — Z79899 Other long term (current) drug therapy: Secondary | ICD-10-CM | POA: Insufficient documentation

## 2010-10-15 ENCOUNTER — Emergency Department (HOSPITAL_COMMUNITY)
Admission: EM | Admit: 2010-10-15 | Discharge: 2010-10-15 | Disposition: A | Discharge status: Home / Self Care | Payer: Self-pay | Attending: Emergency Medicine | Admitting: Emergency Medicine

## 2010-10-15 DIAGNOSIS — I10 Essential (primary) hypertension: Secondary | ICD-10-CM | POA: Insufficient documentation

## 2010-10-15 DIAGNOSIS — Z79899 Other long term (current) drug therapy: Secondary | ICD-10-CM | POA: Insufficient documentation

## 2010-10-15 DIAGNOSIS — IMO0001 Reserved for inherently not codable concepts without codable children: Secondary | ICD-10-CM | POA: Insufficient documentation

## 2010-10-29 ENCOUNTER — Emergency Department (HOSPITAL_COMMUNITY)
Admission: EM | Admit: 2010-10-29 | Discharge: 2010-10-29 | Disposition: A | Discharge status: Home / Self Care | Payer: Self-pay | Attending: Emergency Medicine | Admitting: Emergency Medicine

## 2010-10-29 DIAGNOSIS — R300 Dysuria: Secondary | ICD-10-CM | POA: Insufficient documentation

## 2010-10-29 DIAGNOSIS — E785 Hyperlipidemia, unspecified: Secondary | ICD-10-CM | POA: Insufficient documentation

## 2010-10-29 DIAGNOSIS — K219 Gastro-esophageal reflux disease without esophagitis: Secondary | ICD-10-CM | POA: Insufficient documentation

## 2010-10-29 DIAGNOSIS — N390 Urinary tract infection, site not specified: Secondary | ICD-10-CM | POA: Insufficient documentation

## 2010-10-29 DIAGNOSIS — F341 Dysthymic disorder: Secondary | ICD-10-CM | POA: Insufficient documentation

## 2010-10-29 DIAGNOSIS — R319 Hematuria, unspecified: Secondary | ICD-10-CM | POA: Insufficient documentation

## 2010-10-29 DIAGNOSIS — R10819 Abdominal tenderness, unspecified site: Secondary | ICD-10-CM | POA: Insufficient documentation

## 2010-10-29 DIAGNOSIS — I10 Essential (primary) hypertension: Secondary | ICD-10-CM | POA: Insufficient documentation

## 2010-10-29 DIAGNOSIS — Z79899 Other long term (current) drug therapy: Secondary | ICD-10-CM | POA: Insufficient documentation

## 2010-10-29 LAB — URINE MICROSCOPIC-ADD ON

## 2010-10-29 LAB — URINALYSIS, ROUTINE W REFLEX MICROSCOPIC
Bilirubin Urine: NEGATIVE
Hgb urine dipstick: NEGATIVE
Ketones, ur: NEGATIVE mg/dL
Nitrite: NEGATIVE
Nitrite: NEGATIVE
Protein, ur: NEGATIVE mg/dL
Specific Gravity, Urine: 1.018 (ref 1.005–1.030)
Urobilinogen, UA: 0.2 mg/dL (ref 0.0–1.0)
pH: 8 (ref 5.0–8.0)
pH: 7 (ref 5.0–8.0)

## 2010-10-31 LAB — URINE CULTURE

## 2010-12-15 ENCOUNTER — Inpatient Hospital Stay (INDEPENDENT_AMBULATORY_CARE_PROVIDER_SITE_OTHER)
Admission: RE | Admit: 2010-12-15 | Discharge: 2010-12-15 | Disposition: A | Discharge status: Home / Self Care | Payer: Self-pay | Source: Ambulatory Visit | Attending: Family Medicine | Admitting: Family Medicine

## 2010-12-15 DIAGNOSIS — N390 Urinary tract infection, site not specified: Secondary | ICD-10-CM

## 2010-12-15 LAB — POCT URINALYSIS DIP (DEVICE)
Bilirubin Urine: NEGATIVE
Ketones, ur: NEGATIVE mg/dL
Specific Gravity, Urine: 1.015 (ref 1.005–1.030)

## 2010-12-15 LAB — WET PREP, GENITAL
Clue Cells Wet Prep HPF POC: NONE SEEN
Trich, Wet Prep: NONE SEEN
Yeast Wet Prep HPF POC: NONE SEEN

## 2010-12-15 LAB — POCT PREGNANCY, URINE: Preg Test, Ur: NEGATIVE

## 2010-12-16 LAB — GC/CHLAMYDIA PROBE AMP, GENITAL
Chlamydia, DNA Probe: NEGATIVE
GC Probe Amp, Genital: NEGATIVE

## 2010-12-16 LAB — URINE CULTURE

## 2010-12-23 LAB — URINALYSIS, ROUTINE W REFLEX MICROSCOPIC
Ketones, ur: NEGATIVE
Nitrite: NEGATIVE
Specific Gravity, Urine: 1.023
Urobilinogen, UA: 0.2
pH: 6

## 2010-12-23 LAB — LIPASE, BLOOD: Lipase: 23

## 2010-12-23 LAB — COMPREHENSIVE METABOLIC PANEL
ALT: 35
AST: 37
CO2: 26
Calcium: 9.1
Chloride: 107
GFR calc Af Amer: >60
GFR calc non Af Amer: >60
Potassium: 4
Sodium: 137

## 2010-12-23 LAB — CBC
MCHC: 35.6
RBC: 4.38
WBC: 6.5

## 2010-12-23 LAB — URINE MICROSCOPIC-ADD ON

## 2010-12-23 LAB — DIFFERENTIAL
Eosinophils Absolute: 0.1
Eosinophils Relative: 1
Lymphs Abs: 2.1

## 2011-02-12 ENCOUNTER — Emergency Department (INDEPENDENT_AMBULATORY_CARE_PROVIDER_SITE_OTHER): Payer: Self-pay

## 2011-02-12 ENCOUNTER — Emergency Department (HOSPITAL_COMMUNITY)
Admission: EM | Admit: 2011-02-12 | Discharge: 2011-02-12 | Disposition: A | Discharge status: Home / Self Care | Payer: Self-pay | Source: Home / Self Care | Attending: Family Medicine | Admitting: Family Medicine

## 2011-02-12 DIAGNOSIS — IMO0002 Reserved for concepts with insufficient information to code with codable children: Secondary | ICD-10-CM

## 2011-02-12 DIAGNOSIS — S43402A Unspecified sprain of left shoulder joint, initial encounter: Secondary | ICD-10-CM

## 2011-02-12 HISTORY — DX: Essential (primary) hypertension: I10

## 2011-02-12 HISTORY — DX: Fibromyalgia: M79.7

## 2011-02-12 MED ORDER — IBUPROFEN 800 MG PO TABS: 800.0000 mg | ORAL_TABLET | Freq: Three times a day (TID) | ORAL | Status: AC

## 2011-02-12 MED ORDER — ACETAMINOPHEN-CODEINE #3 300-30 MG PO TABS: 1.0000 | ORAL_TABLET | Freq: Four times a day (QID) | ORAL | Status: AC | PRN

## 2011-02-12 NOTE — ED Notes (Signed)
States she tripped over a cord in her yard last night, fell and hit her head and lt shoulder on the porch.  Denies LOC, n/v.  C/o headache, lt neck and shoulder pain.  States headache is relieved with pain medication but unable to tolerate seat belt touching lt shoulser

## 2011-02-12 NOTE — ED Provider Notes (Signed)
History     CSN: [id number] Arrival date & time: 02/12/2011  9:02 AM   First MD Initiated Contact with Patient 02/12/11 [phone]      Chief Complaint  Patient presents with  . Fall  . Shoulder Pain    (Consider location/radiation/quality/duration/timing/severity/associated sxs/prior treatment) Patient is a 35 y.o. female presenting with fall and shoulder pain. The history is provided by the patient.  Fall The accident occurred yesterday. The fall occurred while walking (tripped over a cord in the yard). The point of impact was the left shoulder. The pain is present in the left shoulder and neck. She was ambulatory at the scene. There was no drug use involved in the accident. Pertinent negatives include no numbness. The symptoms are aggravated by use of the injured limb.  Shoulder Pain    Past Medical History  Diagnosis Date  . Fibromyalgia   . Migraine   . Hypertension     Past Surgical History  Procedure Date  . Tubal ligation     History reviewed. No pertinent family history.  History  Substance Use Topics  . Smoking status: Never Smoker   . Smokeless tobacco: Not on file  . Alcohol Use: Yes     occasional    OB History    Grav Para Term Preterm Abortions TAB SAB Ect Mult Living                  Review of Systems  Constitutional: Negative.   HENT: Negative for neck pain.   Cardiovascular: Negative.   Gastrointestinal: Negative.   Genitourinary: Negative.   Neurological: Negative for weakness and numbness.    Allergies  Review of patient's allergies indicates no known allergies.  Home Medications   Current Outpatient Rx  Name Route Sig Dispense Refill  . ESOMEPRAZOLE MAGNESIUM 40 MG PO CPDR Oral Take 40 mg by mouth daily before breakfast.      . LISINOPRIL 20 MG PO TABS Oral Take 20 mg by mouth daily.      . MELOXICAM PO Oral Take by mouth as needed.      Marland Kitchen NABUMETONE PO Oral Take by mouth as needed.      Marland Kitchen PREGABALIN 75 MG PO CAPS Oral Take 75 mg  by mouth 2 (two) times daily.      . VENLAFAXINE HCL 150 MG PO CP24 Oral Take 150 mg by mouth daily.      . ACETAMINOPHEN-CODEINE #3 300-30 MG PO TABS Oral Take 1-2 tablets by mouth every 6 (six) hours as needed for pain. 20 tablet 0  . IBUPROFEN 800 MG PO TABS Oral Take 1 tablet (800 mg total) by mouth 3 (three) times daily. 21 tablet 0    BP 122/81  Pulse 86  Temp(Src) 99.1 F (37.3 C) (Oral)  Resp 18  SpO2 98%  LMP 01/18/2011  Physical Exam  Constitutional: She appears well-developed and well-nourished.  HENT:  Head: Normocephalic and atraumatic.  Neck: Normal range of motion.  Cardiovascular: Normal rate.   Musculoskeletal:       Pain to palpation left shoulder area. Pain with rom. N/v intact distally. Pain with abduction. Skin with slight bruising.     ED Course  Procedures (including critical care time)  Labs Reviewed - No data to display No results found.   1. Sprain of shoulder, left               Randa Spike, MD 02/12/11 1020

## 2011-04-05 ENCOUNTER — Emergency Department (HOSPITAL_COMMUNITY)
Admission: EM | Admit: 2011-04-05 | Discharge: 2011-04-05 | Disposition: A | Discharge status: Home / Self Care | Payer: Self-pay | Attending: Emergency Medicine | Admitting: Emergency Medicine

## 2011-04-05 ENCOUNTER — Encounter (HOSPITAL_COMMUNITY): Payer: Self-pay | Admitting: Emergency Medicine

## 2011-04-05 DIAGNOSIS — IMO0001 Reserved for inherently not codable concepts without codable children: Secondary | ICD-10-CM | POA: Insufficient documentation

## 2011-04-05 DIAGNOSIS — I10 Essential (primary) hypertension: Secondary | ICD-10-CM | POA: Insufficient documentation

## 2011-04-05 DIAGNOSIS — M5412 Radiculopathy, cervical region: Secondary | ICD-10-CM | POA: Insufficient documentation

## 2011-04-05 DIAGNOSIS — M542 Cervicalgia: Secondary | ICD-10-CM | POA: Insufficient documentation

## 2011-04-05 MED ORDER — OXYCODONE-ACETAMINOPHEN 5-325 MG PO TABS: 1.0000 | ORAL_TABLET | ORAL | Status: AC | PRN

## 2011-04-05 NOTE — ED Notes (Signed)
Pt. Stated, I've had neck pain for 2 years and now its going half way down my back its just getting worse

## 2011-04-05 NOTE — ED Provider Notes (Cosign Needed)
History     CSN: [id number]  Arrival date & time 04/05/11  1159   First MD Initiated Contact with Patient 04/05/11 1320      Chief Complaint  Patient presents with  . Neck Injury    neck pain     (Consider location/radiation/quality/duration/timing/severity/associated sxs/prior treatment) Patient is a 36 y.o. female presenting with neck injury.  Neck Injury    Past Medical History  Diagnosis Date  . Fibromyalgia   . Migraine   . Hypertension     Past Surgical History  Procedure Date  . Tubal ligation     No family history on file.  History  Substance Use Topics  . Smoking status: Never Smoker   . Smokeless tobacco: Not on file  . Alcohol Use: Yes     occasional    OB History    Grav Para Term Preterm Abortions TAB SAB Ect Mult Living                  Review of Systems  All other systems reviewed and are negative.    Allergies  Review of patient's allergies indicates no known allergies.  Home Medications   Current Outpatient Rx  Name Route Sig Dispense Refill  . ESOMEPRAZOLE MAGNESIUM 40 MG PO CPDR Oral Take 40 mg by mouth daily before breakfast.      . HYDROCODONE-ACETAMINOPHEN 5-500 MG PO TABS Oral Take 1 tablet by mouth every 6 (six) hours as needed. For pain    . LISINOPRIL 20 MG PO TABS Oral Take 20 mg by mouth daily.      . MELOXICAM PO Oral Take 15 mg by mouth as needed. For pain    . NABUMETONE PO Oral Take 500 mg by mouth at bedtime as needed. For pain    . PREGABALIN 75 MG PO CAPS Oral Take 75 mg by mouth 2 (two) times daily.      . VENLAFAXINE HCL ER 150 MG PO CP24 Oral Take 150 mg by mouth daily.        BP 140/92  Pulse 92  Temp(Src) 98.3 F (36.8 C) (Oral)  Resp 16  SpO2 98%  LMP 03/11/2011  Physical Exam  Nursing note and vitals reviewed. Constitutional: She is oriented to person, place, and time. She appears well-developed and well-nourished.  HENT:  Head: Normocephalic and atraumatic.  Eyes: Conjunctivae and EOM are  normal. Pupils are equal, round, and reactive to light.  Neck: Neck supple.       Soft tissue tenderness to the left paracervical musculature left trapezius. No redness or swelling.  Cardiovascular: Normal rate and regular rhythm.  Exam reveals no gallop and no friction rub.   No murmur heard. Pulmonary/Chest: Breath sounds normal. She has no wheezes. She has no rales. She exhibits no tenderness.  Abdominal: Soft. Bowel sounds are normal. She exhibits no distension. There is no tenderness. There is no rebound and no guarding.  Musculoskeletal: Normal range of motion. She exhibits no edema and no tenderness.  Neurological: She is alert and oriented to person, place, and time. No cranial nerve deficit. Coordination normal.  Skin: Skin is warm and dry. No rash noted.  Psychiatric: She has a normal mood and affect.    ED Course  Procedures (including critical care time)  Labs Reviewed - No data to display No results found.   No diagnosis found.    MDM  Pt is seen and examined;  Initial history and physical completed.  Will follow.  Chronic neck pain. Scheduled to see pain management specialist next month. Will likely need orthopedic referral and further outpatient evaluation. No emergent conditions present at this time    Theron Arista A. Patrica Duel, MD 04/05/11 1328

## 2011-04-25 ENCOUNTER — Encounter: Payer: Self-pay | Attending: Physical Medicine and Rehabilitation | Admitting: Physical Medicine and Rehabilitation

## 2011-06-16 ENCOUNTER — Encounter (HOSPITAL_COMMUNITY): Payer: Self-pay | Admitting: *Deleted

## 2011-06-16 ENCOUNTER — Emergency Department (HOSPITAL_COMMUNITY)
Admission: EM | Admit: 2011-06-16 | Discharge: 2011-06-16 | Disposition: A | Discharge status: Home / Self Care | Payer: Self-pay | Source: Home / Self Care | Attending: Emergency Medicine | Admitting: Emergency Medicine

## 2011-06-16 DIAGNOSIS — K0889 Other specified disorders of teeth and supporting structures: Secondary | ICD-10-CM

## 2011-06-16 DIAGNOSIS — J209 Acute bronchitis, unspecified: Secondary | ICD-10-CM

## 2011-06-16 DIAGNOSIS — K089 Disorder of teeth and supporting structures, unspecified: Secondary | ICD-10-CM

## 2011-06-16 DIAGNOSIS — J019 Acute sinusitis, unspecified: Secondary | ICD-10-CM

## 2011-06-16 MED ORDER — OXYCODONE-ACETAMINOPHEN 5-325 MG PO TABS: ORAL_TABLET | ORAL | Status: AC

## 2011-06-16 MED ORDER — AMOXICILLIN 500 MG PO CAPS: 1000.0000 mg | ORAL_CAPSULE | Freq: Three times a day (TID) | ORAL | Status: AC

## 2011-06-16 MED ORDER — GUAIFENESIN-CODEINE 100-10 MG/5ML PO SYRP: 10.0000 mL | ORAL_SOLUTION | Freq: Four times a day (QID) | ORAL | Status: AC | PRN

## 2011-06-16 NOTE — ED Notes (Signed)
Pt  Has  Symptoms  Of  Cough  /  Congested  As  Well  As     Sinus  Headache  And  Pressure      Pt  Reports  The  Symptoms  Have  Been in  Existence  For   Several  Weeks     -    She   Reports  Pain   In the  Region of l  Side  Face  /  Jaw   Area  As  Well     Pt  Appears  In no  Severe  Distress  She is  Speaking in  Complete  sentances  And  Is  In no  Severe  Distress  At this time

## 2011-06-16 NOTE — ED Provider Notes (Signed)
Chief Complaint  Patient presents with  . Cough    History of Present Illness:   The patient is a 36 year old female who is a 2 week history of cough productive of green sputum, wheezing, nasal congestion green drainage, facial and nasal pain, left ear pain, she had a few chills at the onset, and sore throat. For the last 3 days she's had pain in her left jaw area, this is poorly localized, it seems to involve the teeth, but she's had no swelling.  Review of Systems:  Other than noted above, the patient denies any of the following symptoms. Systemic:  No fever, chills, sweats, fatigue, myalgias, headache, or anorexia. Eye:  No redness, pain or drainage. ENT:  No earache, nasal congestion, rhinorrhea, sinus pressure, or sore throat. Lungs:  No cough, sputum production, wheezing, shortness of breath. Or chest pain. GI:  No nausea, vomiting, abdominal pain or diarrhea. Skin:  No rash or itching.  PMFSH:  Past medical history, family history, social history, meds, and allergies were reviewed.  Physical Exam:   Vital signs:  BP 124/87  Pulse 105  Temp(Src) 99.4 F (37.4 C) (Oral)  Resp 16  SpO2 97%  LMP 06/02/2011 General:  Alert, in no distress. Eye:  No conjunctival injection or drainage. ENT:  TMs and canals were normal, without erythema or inflammation.  Nasal mucosa was clear and uncongested, without drainage.  Mucous membranes were moist.  Pharynx was clear, without exudate or drainage.  There were no oral ulcerations or lesions. Her dentition is completely normal. She has a little sensitivity over her left upper and lower second molars, but they look completely normal. There is no swelling of the gingiva, no swelling of the throat. There is minimal pain over the TMJ, she is able to open and close her mouth fully without any pain or discomfort. Neck:  Supple, no adenopathy, tenderness or mass. Lungs:  No respiratory distress.  Lungs were clear to auscultation, without wheezes, rales or  rhonchi.  Breath sounds were clear and equal bilaterally. Heart:  Regular rhythm, without gallops, murmers or rubs. Skin:  Clear, warm, and dry, without rash or lesions.  Assessment:   Diagnoses that have been ruled out:  None  Diagnoses that are still under consideration:  None  Final diagnoses:  Acute sinusitis  Acute bronchitis  Toothache - I'm not sure whether her jaw pain is coming from a dental source or from the TMJ syndrome, or possibly could be coming from sinusitis. I have asked her to followup with her dentist in the meantime start the amoxicillin and she was given oxycodone for pain.       Plan:   1.  The following meds were prescribed:   New Prescriptions   AMOXICILLIN (AMOXIL) 500 MG CAPSULE    Take 2 capsules (1,000 mg total) by mouth 3 (three) times daily.   GUAIFENESIN-CODEINE (GUIATUSS AC) 100-10 MG/5ML SYRUP    Take 10 mLs by mouth 4 (four) times daily as needed for cough.   OXYCODONE-ACETAMINOPHEN (PERCOCET) 5-325 MG PER TABLET    1 to 2 tablets every 6 hours as needed for pain.   2.  The patient was instructed in symptomatic care and handouts were given. 3.  The patient was told to return if becoming worse in any way, if no better in 3 or 4 days, and given some red flag symptoms that would indicate earlier return.   Reuben Likes, MD 06/16/11 2240

## 2011-06-16 NOTE — Discharge Instructions (Signed)

## 2011-07-03 ENCOUNTER — Other Ambulatory Visit (HOSPITAL_COMMUNITY): Payer: Self-pay | Admitting: Internal Medicine

## 2011-07-09 ENCOUNTER — Other Ambulatory Visit (HOSPITAL_COMMUNITY): Payer: Self-pay

## 2011-08-11 ENCOUNTER — Ambulatory Visit: Payer: Self-pay | Admitting: Physical Medicine and Rehabilitation

## 2011-08-15 ENCOUNTER — Emergency Department (HOSPITAL_COMMUNITY)
Admission: EM | Admit: 2011-08-15 | Discharge: 2011-08-15 | Disposition: A | Discharge status: Home / Self Care | Payer: Self-pay | Source: Home / Self Care | Attending: Emergency Medicine | Admitting: Emergency Medicine

## 2011-08-15 ENCOUNTER — Encounter (HOSPITAL_COMMUNITY): Payer: Self-pay | Admitting: Emergency Medicine

## 2011-08-15 DIAGNOSIS — N760 Acute vaginitis: Secondary | ICD-10-CM

## 2011-08-15 HISTORY — DX: Major depressive disorder, single episode, unspecified: F32.9

## 2011-08-15 HISTORY — DX: Depression, unspecified: F32.A

## 2011-08-15 HISTORY — DX: Anxiety disorder, unspecified: F41.9

## 2011-08-15 LAB — WET PREP, GENITAL: Clue Cells Wet Prep HPF POC: NONE SEEN

## 2011-08-15 MED ORDER — FLUCONAZOLE 150 MG PO TABS: 200.0000 mg | ORAL_TABLET | Freq: Every day | ORAL | Status: DC

## 2011-08-15 NOTE — ED Provider Notes (Signed)
History     CSN: [id number]  Arrival date & time 08/15/11  1235   First MD Initiated Contact with Patient 08/15/11 1252      Chief Complaint  Patient presents with  . Vaginal Pain    (Consider location/radiation/quality/duration/timing/severity/associated sxs/prior treatment) HPI Comments: Presents urgent care complaining of vaginal discharge started yesterday with marked irritation redness soreness and her private. Some of some greenish type discharge which he wiped. Patient denies any urinary discomfort such as burning, pressure. She also describes a she's been coughing she took some leftover antibiotics and she thinks she has bronchitis. Denies any fevers, some mild discomfort in her suprapubic hair and right lower quadrant area. No nausea, no vomiting no fevers.  Patient is a 36 y.o. female presenting with vaginal pain. The history is provided by the patient.  Vaginal Pain This is a new problem. The current episode started yesterday. The problem occurs constantly. The problem has not changed since onset.Pertinent negatives include no abdominal pain and no shortness of breath. She has tried nothing for the symptoms.    Past Medical History  Diagnosis Date  . Fibromyalgia   . Migraine   . Hypertension   . Depression   . Anxiety     Past Surgical History  Procedure Date  . Tubal ligation     History reviewed. No pertinent family history.  History  Substance Use Topics  . Smoking status: Current Everyday Smoker  . Smokeless tobacco: Not on file  . Alcohol Use: Yes     occasional    OB History    Grav Para Term Preterm Abortions TAB SAB Ect Mult Living                  Review of Systems  Constitutional: Negative for fever, chills, activity change, appetite change, fatigue and unexpected weight change.  Respiratory: Negative for shortness of breath.   Gastrointestinal: Negative for vomiting, abdominal pain and constipation.  Genitourinary: Positive for vaginal  pain. Negative for dysuria.  Skin: Negative for color change.    Allergies  Review of patient's allergies indicates no known allergies.  Home Medications   Current Outpatient Rx  Name Route Sig Dispense Refill  . BUTALBITAL-APAP-CAFF-COD 50-325-40-30 MG PO CAPS Oral Take 1 capsule by mouth every 4 (four) hours as needed.    Marland Kitchen CARISOPRODOL 350 MG PO TABS Oral Take 350 mg by mouth 4 (four) times daily as needed.    Marland Kitchen CLONAZEPAM 0.5 MG PO TABS Oral Take 0.5 mg by mouth 2 (two) times daily as needed.    Marland Kitchen ESOMEPRAZOLE MAGNESIUM 40 MG PO CPDR Oral Take 40 mg by mouth daily before breakfast.      . FLUCONAZOLE 150 MG PO TABS Oral Take 150 mg by mouth once.    Marland Kitchen FLUCONAZOLE 150 MG PO TABS Oral Take 1.5 tablets (225 mg total) by mouth daily. 2 tablet 0  . HYDROCODONE-ACETAMINOPHEN 5-500 MG PO TABS Oral Take 1 tablet by mouth every 6 (six) hours as needed. For pain    . LISINOPRIL 20 MG PO TABS Oral Take 20 mg by mouth daily.      . MELOXICAM PO Oral Take 15 mg by mouth as needed. For pain    . NABUMETONE PO Oral Take 500 mg by mouth at bedtime as needed. For pain    . PREGABALIN 75 MG PO CAPS Oral Take 75 mg by mouth 2 (two) times daily.      . VENLAFAXINE HCL ER 150 MG  PO CP24 Oral Take 150 mg by mouth daily.        BP 117/79  Pulse 87  Temp(Src) 97.8 F (36.6 C) (Oral)  Resp 17  SpO2 100%  LMP 08/01/2011  Physical Exam  Nursing note and vitals reviewed. Constitutional: She appears well-developed and well-nourished.  HENT:  Head: Normocephalic.  Eyes: Conjunctivae are normal.  Neck: Neck supple.  Abdominal: She exhibits no distension. There is no tenderness. There is no rebound and no guarding.  Genitourinary: No labial fusion. There is rash on the right labia. There is no tenderness or injury on the right labia. There is rash on the left labia. There is no tenderness or injury on the left labia. There is erythema around the vagina. Vaginal discharge found.  Skin: No rash noted.     ED Course  Procedures (including critical care time)  Labs Reviewed  WET PREP, GENITAL - Abnormal; Notable for the following:    Yeast Wet Prep HPF POC TOO NUMEROUS TO COUNT (*)    WBC, Wet Prep HPF POC MODERATE (*)    All other components within normal limits  GC/CHLAMYDIA PROBE AMP, GENITAL   No results found.   1. Vaginitis and vulvovaginitis       MDM  Vaginal irritation vaginal discharge in the last 24 hours. Discharge resemble clinically any active yeast infection. Patient also residual cough and relatively normal lung exam encourage her to take some prednisone that she had leftovers at home as she had sufficient for 5 days.        Jimmie Molly, MD 08/15/11 [phone]

## 2011-08-15 NOTE — Discharge Instructions (Signed)
We will contact you if any further treatment will be necessary when we obtain test results

## 2011-08-15 NOTE — ED Notes (Signed)
Vaginal discharge onset yesterday, reports vaginal swelling onset yesterday.  Discharge is white, but green with wiping.  Patient reports lubricants predispose her to vaginal infections.  She is concerned this is what happened.  Patient also was treating self with left over amoxicillin - 2 days dosing-last dose yesterday.  Self treating what she thinks is bronchitis.  Some tenderness in lower abdomen, more on the right.  Intermittent back pain.  Denies urinary symptoms.

## 2011-08-16 LAB — GC/CHLAMYDIA PROBE AMP, GENITAL
Chlamydia, DNA Probe: NEGATIVE
GC Probe Amp, Genital: NEGATIVE

## 2011-08-17 ENCOUNTER — Emergency Department (HOSPITAL_COMMUNITY)
Admission: EM | Admit: 2011-08-17 | Discharge: 2011-08-17 | Disposition: A | Discharge status: Home / Self Care | Payer: Self-pay | Attending: Emergency Medicine | Admitting: Emergency Medicine

## 2011-08-17 ENCOUNTER — Emergency Department (HOSPITAL_COMMUNITY): Payer: Self-pay

## 2011-08-17 ENCOUNTER — Encounter (HOSPITAL_COMMUNITY): Payer: Self-pay | Admitting: *Deleted

## 2011-08-17 DIAGNOSIS — X58XXXA Exposure to other specified factors, initial encounter: Secondary | ICD-10-CM | POA: Insufficient documentation

## 2011-08-17 DIAGNOSIS — S62113A Displaced fracture of triquetrum [cuneiform] bone, unspecified wrist, initial encounter for closed fracture: Secondary | ICD-10-CM | POA: Insufficient documentation

## 2011-08-17 DIAGNOSIS — I10 Essential (primary) hypertension: Secondary | ICD-10-CM | POA: Insufficient documentation

## 2011-08-17 DIAGNOSIS — IMO0001 Reserved for inherently not codable concepts without codable children: Secondary | ICD-10-CM | POA: Insufficient documentation

## 2011-08-17 DIAGNOSIS — T148XXA Other injury of unspecified body region, initial encounter: Secondary | ICD-10-CM

## 2011-08-17 DIAGNOSIS — F341 Dysthymic disorder: Secondary | ICD-10-CM | POA: Insufficient documentation

## 2011-08-17 DIAGNOSIS — Z79899 Other long term (current) drug therapy: Secondary | ICD-10-CM | POA: Insufficient documentation

## 2011-08-17 MED ORDER — IBUPROFEN 200 MG PO TABS
600.0000 mg | ORAL_TABLET | Freq: Once | ORAL | Status: AC
  Administered 2011-08-17: 600 mg via ORAL
  Filled 2011-08-17: qty 1
  Filled 2011-08-17: qty 3

## 2011-08-17 MED ORDER — NAPROXEN 500 MG PO TABS: 500.0000 mg | ORAL_TABLET | Freq: Two times a day (BID) | ORAL | Status: DC

## 2011-08-17 MED ORDER — OXYCODONE-ACETAMINOPHEN 5-325 MG PO TABS: 1.0000 | ORAL_TABLET | ORAL | Status: AC | PRN

## 2011-08-17 NOTE — ED Provider Notes (Signed)
History     CSN: [id number]  Arrival date & time 08/17/11  1014   First MD Initiated Contact with Patient 08/17/11 1048      Chief Complaint  Patient presents with  . Wrist Pain    (Consider location/radiation/quality/duration/timing/severity/associated sxs/prior treatment) Patient is a 36 y.o. female presenting with wrist pain. The history is provided by the patient.  Wrist Pain This is a new (pain like this occured 2 months ago, again 2 weeks ago, adn now today) problem. The current episode started today. The problem occurs constantly. The problem has been gradually worsening. Pertinent negatives include no abdominal pain, anorexia, arthralgias, change in bowel habit, chest pain, chills, congestion, diaphoresis, fatigue, fever, headaches, joint swelling, myalgias, nausea, neck pain, numbness, rash, sore throat, swollen glands, urinary symptoms, vertigo, visual change, vomiting or weakness. Exacerbated by: palpation and movement  Treatments tried: norco  The treatment provided no relief.    Past Medical History  Diagnosis Date  . Fibromyalgia   . Migraine   . Hypertension   . Depression   . Anxiety     Past Surgical History  Procedure Date  . Tubal ligation     History reviewed. No pertinent family history.  History  Substance Use Topics  . Smoking status: Current Everyday Smoker  . Smokeless tobacco: Not on file  . Alcohol Use: Yes     occasional    OB History    Grav Para Term Preterm Abortions TAB SAB Ect Mult Living                  Review of Systems  Constitutional: Negative for fever, chills, diaphoresis and fatigue.  HENT: Negative for congestion, sore throat and neck pain.   Cardiovascular: Negative for chest pain.  Gastrointestinal: Negative for nausea, vomiting, abdominal pain, anorexia and change in bowel habit.  Musculoskeletal: Negative for myalgias, joint swelling and arthralgias.  Skin: Negative for color change and rash.  Neurological:  Negative for vertigo, weakness, numbness and headaches.  All other systems reviewed and are negative.    Allergies  Review of patient's allergies indicates no known allergies.  Home Medications   Current Outpatient Rx  Name Route Sig Dispense Refill  . ASCORBIC ACID 1000 MG PO TABS Oral Take 1,000 mg by mouth daily.    Marland Kitchen CLONAZEPAM 0.5 MG PO TABS Oral Take 0.5 mg by mouth 2 (two) times daily as needed. nerves    . ESOMEPRAZOLE MAGNESIUM 40 MG PO CPDR Oral Take 40 mg by mouth daily before breakfast.      . FLUCONAZOLE 150 MG PO TABS Oral Take 150 mg by mouth once. One 5/24 and repeat in one week    . HYDROCODONE-ACETAMINOPHEN 5-325 MG PO TABS Oral Take 1 tablet by mouth every 6 (six) hours as needed. pain    . LISINOPRIL 20 MG PO TABS Oral Take 20 mg by mouth daily.      Marland Kitchen NABUMETONE PO Oral Take 500 mg by mouth at bedtime as needed. For pain    . PREGABALIN 75 MG PO CAPS Oral Take 75 mg by mouth 2 (two) times daily.      . VENLAFAXINE HCL ER 150 MG PO CP24 Oral Take 150 mg by mouth daily.        BP 114/80  Pulse 97  Temp 98.6 F (37 C)  Resp 18  Wt 176 lb 5.9 oz (80 kg)  SpO2 100%  LMP 08/01/2011  Physical Exam  Nursing note and vitals reviewed.  Constitutional: She is oriented to person, place, and time. She appears well-developed and well-nourished. No distress.  HENT:  Head: Normocephalic and atraumatic.  Eyes: Conjunctivae and EOM are normal.  Neck: Normal range of motion.  Cardiovascular:       Intact distal pulses   Pulmonary/Chest: Effort normal.  Musculoskeletal: Normal range of motion.       Decreased active range of motion and pain with passive range of motion.  Tenderness to palpation over bilateral wrists processes.  No obvious deformity. Normal strength and ROM of right elbow and shoulder. Finger motion intact.   Neurological: She is alert and oriented to person, place, and time.       Distal sensation intact.    Skin: Skin is warm and dry. No rash noted. She  is not diaphoretic.  Psychiatric: She has a normal mood and affect. Her behavior is normal.    ED Course  Procedures (including critical care time)  Labs Reviewed - No data to display Dg Wrist Complete Left  08/17/2011  *RADIOLOGY REPORT*  Clinical Data: Wrist pain.  LEFT WRIST - COMPLETE 3+ VIEW  Comparison: None.  Findings: The carpals are aligned.  The bony mineralization appears normal.  On the lateral view, there is soft tissue swelling dorsal to the carpals.   Also on the lateral view, a thin linear bony density is seen dorsally, for which an avulsion fracture of the triquetrum cannot be excluded, but is not definite.  Otherwise, no additional concerning areas for fracture identified.  IMPRESSION:   Soft tissue swelling dorsal to the carpals.  Cannot exclude an acute avulsion fracture of the triquetrum.  This is not definite. Suggest clinical correlation with point tenderness over the dorsal carpals.  Original Report Authenticated By: Britta Mccreedy, M.D.     No diagnosis found.    MDM  Wrist pain   Patient X-Ray questions for possible avulsion fracture of the triwuetrum. Pain managed in ED. Pt advised to follow up with Hand surgeon  tomorrow Patient placed in volar splint while in ED, conservative therapy recommended and discussed. Patient will be dc home & is agreeable with above plan.         Jaci Carrel, [place] 08/17/11 1247

## 2011-08-17 NOTE — Progress Notes (Signed)
Orthopedic Tech Progress Note Patient Details:  [name] 04/02/1975 [id number]  Ortho Devices Type of Ortho Device: Velcro wrist splint Ortho Device/Splint Location: left wrist Ortho Device/Splint Interventions: Application   Michel Eskelson T 08/17/2011, 1:05 PM

## 2011-08-17 NOTE — Discharge Instructions (Signed)
Wrist Fracture  Your caregiver has diagnosed you as having a fracture of the wrist. A fracture is a break in the bone or bones. A cast or splint is used to protect and keep your injured bone(s) from moving. The cast or splint will usually be on for about 5 to 6 weeks. One of the bones of the wrist (the navicular bone) often does not show up as a fracture on X-ray until later or in the healing phase. With this bone your caregiver will often cast as though it is fractured even if not seen on the X-ray.  HOME CARE INSTRUCTIONS   · To lessen the swelling, keep the injured part elevated while sitting or lying down. Keeping the injury above the level of your heart (the center of the chest) will decrease swelling and pain.  · Do not wear rings or jewelry on the injured hand or wrist.  · Apply ice to the injury for 15 to 20 minutes, 3 to 4 times per day while awake for 2 days. Put the ice in a plastic bag and place a thin towel between the bag of ice and your cast.  · If you have a plaster or fiberglass cast:  · Do not try to scratch the skin under the cast using sharp or pointed objects.  · Check the skin around the cast every day. You may put lotion on any red or sore areas.  · Keep your cast dry and clean.  · If you have a plaster splint:  · Wear the splint as directed.  · You may loosen the elastic around the splint if your fingers become numb, tingle, or turn cold or blue.  · If you have been put in a removable splint, wear and use as directed.  · Do not use powders or deodorants in or around the cast or splint.  · Do not remove padding from your cast or splint.  · Do not put pressure on any part of your cast or splint. It may break. Rest your cast or splint only on a pillow the first 24 hours until it is fully hardened.  · Gently move your fingers often, so they do not get stiff.  · Do not remove the splint unless directed by your caregiver. Casts must be removed by an orthopedist.  · Your cast or splint can be  protected during bathing with a plastic bag. Do not lower the cast or splint into water.  · Only take over-the-counter or prescription medicines for pain, discomfort, or fever as directed by your caregiver.  · Follow up with your caregiver as directed.  SEEK IMMEDIATE MEDICAL CARE IF:   · Your cast or splint gets damaged or breaks.  · Your cast or splint feels too tight or loose.  · You have increased pain, not controlled with medication.  · You have increased swelling.  · Your skin or nails below the injury turn blue or grey or feel cold or numb.  · You have trouble moving or feeling your fingers.  · You experience any burning or stinging from the cast or splint.  · There is a bad smell coming from under the cast or splint.  · New stains or fluids are coming from under the cast or splint.  · You have any new injuries while wearing the cast or splint.  Document Released: 12/18/2004 Document Revised: 02/27/2011 Document Reviewed: 10/07/2006  [employer]® Patient Information ©2012 [employer].

## 2011-08-17 NOTE — ED Notes (Signed)
Pt complains of left wrist pain and popping as well as swelling to that area.  Pt denies any injury to the area.  She has had problems with this area in the past.  Pt has fibromyalgia and took norco and that did nothing for the pain.  Pt has limited range of motion in the left wrist. NAD at this time.

## 2011-08-18 NOTE — ED Provider Notes (Signed)
Medical screening examination/treatment/procedure(s) were performed by non-physician practitioner and as supervising physician I was immediately available for consultation/collaboration.   Carleene Cooper III, MD 08/18/11 [phone]

## 2011-08-18 NOTE — ED Notes (Signed)
Wet prep show yeast TNTC and mod WBC's.  Pt adequately treated at visit with diflucan.  Was seen in ED 2 days later and meds were adjusted by ED physician.

## 2011-08-27 ENCOUNTER — Other Ambulatory Visit (HOSPITAL_COMMUNITY): Payer: Self-pay | Admitting: Sports Medicine

## 2011-08-27 DIAGNOSIS — R52 Pain, unspecified: Secondary | ICD-10-CM

## 2011-08-27 DIAGNOSIS — R609 Edema, unspecified: Secondary | ICD-10-CM

## 2011-08-28 ENCOUNTER — Encounter (HOSPITAL_COMMUNITY): Payer: Self-pay | Admitting: Emergency Medicine

## 2011-08-28 ENCOUNTER — Emergency Department (HOSPITAL_COMMUNITY)
Admission: EM | Admit: 2011-08-28 | Discharge: 2011-08-29 | Disposition: A | Discharge status: Home / Self Care | Payer: Self-pay | Attending: Emergency Medicine | Admitting: Emergency Medicine

## 2011-08-28 DIAGNOSIS — F411 Generalized anxiety disorder: Secondary | ICD-10-CM | POA: Insufficient documentation

## 2011-08-28 DIAGNOSIS — L0390 Cellulitis, unspecified: Secondary | ICD-10-CM

## 2011-08-28 DIAGNOSIS — L02419 Cutaneous abscess of limb, unspecified: Secondary | ICD-10-CM | POA: Insufficient documentation

## 2011-08-28 DIAGNOSIS — I10 Essential (primary) hypertension: Secondary | ICD-10-CM | POA: Insufficient documentation

## 2011-08-28 DIAGNOSIS — F17200 Nicotine dependence, unspecified, uncomplicated: Secondary | ICD-10-CM | POA: Insufficient documentation

## 2011-08-28 DIAGNOSIS — F329 Major depressive disorder, single episode, unspecified: Secondary | ICD-10-CM | POA: Insufficient documentation

## 2011-08-28 DIAGNOSIS — IMO0001 Reserved for inherently not codable concepts without codable children: Secondary | ICD-10-CM | POA: Insufficient documentation

## 2011-08-28 DIAGNOSIS — G43909 Migraine, unspecified, not intractable, without status migrainosus: Secondary | ICD-10-CM | POA: Insufficient documentation

## 2011-08-28 DIAGNOSIS — L03119 Cellulitis of unspecified part of limb: Secondary | ICD-10-CM | POA: Insufficient documentation

## 2011-08-28 DIAGNOSIS — F3289 Other specified depressive episodes: Secondary | ICD-10-CM | POA: Insufficient documentation

## 2011-08-28 LAB — CBC
HCT: 35.1 % — ABNORMAL LOW (ref 36.0–46.0)
Hemoglobin: 11.9 g/dL — ABNORMAL LOW (ref 12.0–15.0)
MCHC: 33.9 g/dL (ref 30.0–36.0)
MCV: 91.6 fL (ref 78.0–100.0)
WBC: 6.5 10*3/uL (ref 4.0–10.5)

## 2011-08-28 LAB — BASIC METABOLIC PANEL
BUN: 13 mg/dL (ref 6–23)
CO2: 23 mEq/L (ref 19–32)
Chloride: 104 mEq/L (ref 96–112)
Creatinine, Ser: 0.67 mg/dL (ref 0.50–1.10)
Glucose, Bld: 93 mg/dL (ref 70–99)

## 2011-08-28 LAB — DIFFERENTIAL
Lymphocytes Relative: 34 % (ref 12–46)
Monocytes Absolute: 0.5 10*3/uL (ref 0.1–1.0)
Monocytes Relative: 7 % (ref 3–12)
Neutro Abs: 3.8 10*3/uL (ref 1.7–7.7)

## 2011-08-28 MED ORDER — KETOROLAC TROMETHAMINE 30 MG/ML IJ SOLN
30.0000 mg | Freq: Once | INTRAMUSCULAR | Status: AC
  Administered 2011-08-28: 30 mg via INTRAMUSCULAR
  Filled 2011-08-28: qty 1

## 2011-08-28 MED ORDER — METOCLOPRAMIDE HCL 5 MG/ML IJ SOLN
10.0000 mg | Freq: Once | INTRAMUSCULAR | Status: AC
  Administered 2011-08-28: 10 mg via INTRAMUSCULAR
  Filled 2011-08-28: qty 2

## 2011-08-28 MED ORDER — DOXYCYCLINE HYCLATE 100 MG PO TABS
100.0000 mg | ORAL_TABLET | Freq: Once | ORAL | Status: AC
  Administered 2011-08-28: 100 mg via ORAL
  Filled 2011-08-28: qty 1

## 2011-08-28 MED ORDER — DOXYCYCLINE HYCLATE 100 MG PO TABS: 100.0000 mg | ORAL_TABLET | Freq: Two times a day (BID) | ORAL | Status: AC

## 2011-08-28 NOTE — ED Notes (Signed)
Med given 

## 2011-08-28 NOTE — Discharge Instructions (Signed)

## 2011-08-28 NOTE — ED Provider Notes (Signed)
History     CSN: [id number]  Arrival date & time 08/28/11  2043   None     Chief Complaint  Patient presents with  . Tick Removal    (Consider location/radiation/quality/duration/timing/severity/associated sxs/prior treatment) HPI Comments: The patient.  Removed.  A tick from her inner left thigh.  Today.  The area is slightly reddened and tender to touch.  She also reports headache, that she's had intermittently for the last 3, weeks.  She does have a history of migraine headaches, and does not relate the headache to the tick bite.  She has not had fevers, myalgias, or rash.  Anywhere else.  Does not have a stiff neck, or photophobia  The history is provided by the patient.    Past Medical History  Diagnosis Date  . Fibromyalgia   . Migraine   . Hypertension   . Depression   . Anxiety     Past Surgical History  Procedure Date  . Tubal ligation     No family history on file.  History  Substance Use Topics  . Smoking status: Current Everyday Smoker  . Smokeless tobacco: Not on file  . Alcohol Use: Yes     occasional    OB History    Grav Para Term Preterm Abortions TAB SAB Ect Mult Living                  Review of Systems  Constitutional: Negative for fever, chills and fatigue.  Eyes: Negative for photophobia and visual disturbance.  Gastrointestinal: Positive for nausea.  Musculoskeletal: Negative for myalgias and joint swelling.  Skin: Positive for wound.  Neurological: Positive for headaches. Negative for dizziness.    Allergies  Scallops  Home Medications   Current Outpatient Rx  Name Route Sig Dispense Refill  . ASCORBIC ACID 1000 MG PO TABS Oral Take 1,000 mg by mouth daily.    . ASPIRIN-ACETAMINOPHEN-CAFFEINE 250-250-65 MG PO TABS Oral Take 1 tablet by mouth every 6 (six) hours as needed.    Marland Kitchen CLONAZEPAM 0.5 MG PO TABS Oral Take 0.5 mg by mouth 2 (two) times daily as needed. nerves    . ESOMEPRAZOLE MAGNESIUM 40 MG PO CPDR Oral Take 40 mg by  mouth daily before breakfast.      . HYDROCODONE-ACETAMINOPHEN 5-325 MG PO TABS Oral Take 1 tablet by mouth every 6 (six) hours as needed. pain    . LISINOPRIL 20 MG PO TABS Oral Take 20 mg by mouth daily.      Marland Kitchen NAPROXEN 500 MG PO TABS Oral Take 1 tablet (500 mg total) by mouth 2 (two) times daily. 30 tablet 0  . PREGABALIN 75 MG PO CAPS Oral Take 75 mg by mouth 2 (two) times daily.      . VENLAFAXINE HCL ER 150 MG PO CP24 Oral Take 150 mg by mouth daily.      Marland Kitchen DOXYCYCLINE HYCLATE 100 MG PO TABS Oral Take 1 tablet (100 mg total) by mouth 2 (two) times daily. 20 tablet 0  . OXYCODONE-ACETAMINOPHEN 5-325 MG PO TABS Oral Take 1 tablet by mouth every 4 (four) hours as needed for pain. 30 tablet 0    BP 129/75  Pulse 86  Temp(Src) 98.5 F (36.9 C) (Oral)  Resp 16  SpO2 100%  LMP 08/01/2011  Physical Exam  Constitutional: She appears well-developed and well-nourished.  HENT:  Head: Normocephalic.  Eyes: Pupils are equal, round, and reactive to light.  Cardiovascular: Normal rate.   Pulmonary/Chest: Effort normal.  Musculoskeletal: Normal range of motion. She exhibits edema and tenderness.       Legs:   ED Course  Procedures (including critical care time)  Labs Reviewed  CBC - Abnormal; Notable for the following:    RBC 3.83 (*)    Hemoglobin 11.9 (*)    HCT 35.1 (*)    All other components within normal limits  DIFFERENTIAL  BASIC METABOLIC PANEL   No results found.   1. Migraine headache   2. Cellulitis     The headache is gone.  There is less erythema to the cellulitis area.  Will discharge him with antibiotic, and close followup  MDM   I will treat this patient's migraine headache, and then reassess the lesion on her leg.  This appears to be a cellulitis or allergic reaction to the tick bite.  The timing is not right for documented spotted fever or lines.  Disease, but will treat with doxycycline with a potential of either        Arman Filter, NP 08/28/11  2350  Arman Filter, NP 08/28/11 2351

## 2011-08-28 NOTE — ED Notes (Signed)
PT. REPORTS TICK BITE AT LEFT CALF YESTERDAY , PRESENTS WITH PROGRESSING REDDNESS/SWELLING AND PAIN WITH NAUSEA.

## 2011-08-28 NOTE — ED Notes (Signed)
The pt has been having chills and not feeling well since she removed a tiny tick for her lt lower leg yesterday.  Blistered lesion with redness and sl swelling.  Itching continues

## 2011-08-29 NOTE — ED Notes (Signed)
Pt ambulated with a steady gait; VSS; A&Ox3; no signs of distress; respirations even and unlabored; skin warm and dry. No questions at this time.  

## 2011-08-30 NOTE — ED Provider Notes (Signed)
Medical screening examination/treatment/procedure(s) were performed by non-physician practitioner and as supervising physician I was immediately available for consultation/collaboration.   Benedicta Sultan A Annelise Mccoy, MD 08/30/11 0742 

## 2011-09-01 ENCOUNTER — Inpatient Hospital Stay (HOSPITAL_COMMUNITY): Admission: RE | Admit: 2011-09-01 | Payer: Self-pay | Source: Ambulatory Visit

## 2011-09-08 ENCOUNTER — Other Ambulatory Visit (HOSPITAL_COMMUNITY): Payer: Self-pay | Admitting: Sports Medicine

## 2011-09-08 ENCOUNTER — Ambulatory Visit (HOSPITAL_COMMUNITY)
Admission: RE | Admit: 2011-09-08 | Discharge: 2011-09-08 | Disposition: A | Discharge status: Home / Self Care | Payer: Self-pay | Source: Ambulatory Visit | Attending: Sports Medicine | Admitting: Sports Medicine

## 2011-09-08 DIAGNOSIS — R52 Pain, unspecified: Secondary | ICD-10-CM

## 2011-09-08 DIAGNOSIS — R609 Edema, unspecified: Secondary | ICD-10-CM

## 2011-09-08 DIAGNOSIS — Z01419 Encounter for gynecological examination (general) (routine) without abnormal findings: Secondary | ICD-10-CM | POA: Insufficient documentation

## 2011-09-08 MED ORDER — GADOBENATE DIMEGLUMINE 529 MG/ML IV SOLN
5.0000 mL | Freq: Once | INTRAVENOUS | Status: AC | PRN
  Administered 2011-09-08: 0.05 mL via INTRAVENOUS

## 2011-09-08 MED ORDER — IOHEXOL 180 MG/ML  SOLN
20.0000 mL | Freq: Once | INTRAMUSCULAR | Status: AC | PRN
  Administered 2011-09-08: 7.5 mL via INTRA_ARTICULAR

## 2011-09-18 ENCOUNTER — Encounter (HOSPITAL_COMMUNITY): Payer: Self-pay | Admitting: Emergency Medicine

## 2011-09-18 ENCOUNTER — Emergency Department (HOSPITAL_COMMUNITY)
Admission: EM | Admit: 2011-09-18 | Discharge: 2011-09-18 | Disposition: A | Discharge status: Home / Self Care | Payer: Self-pay | Attending: Emergency Medicine | Admitting: Emergency Medicine

## 2011-09-18 DIAGNOSIS — R51 Headache: Secondary | ICD-10-CM | POA: Insufficient documentation

## 2011-09-18 DIAGNOSIS — M62838 Other muscle spasm: Secondary | ICD-10-CM | POA: Insufficient documentation

## 2011-09-18 DIAGNOSIS — I10 Essential (primary) hypertension: Secondary | ICD-10-CM | POA: Insufficient documentation

## 2011-09-18 DIAGNOSIS — Z79899 Other long term (current) drug therapy: Secondary | ICD-10-CM | POA: Insufficient documentation

## 2011-09-18 DIAGNOSIS — M542 Cervicalgia: Secondary | ICD-10-CM | POA: Insufficient documentation

## 2011-09-18 MED ORDER — SODIUM CHLORIDE 0.9 % IV BOLUS (SEPSIS)
1000.0000 mL | Freq: Once | INTRAVENOUS | Status: AC
  Administered 2011-09-18: 1000 mL via INTRAVENOUS

## 2011-09-18 MED ORDER — DIPHENHYDRAMINE HCL 50 MG/ML IJ SOLN
25.0000 mg | Freq: Once | INTRAMUSCULAR | Status: AC
  Administered 2011-09-18: 25 mg via INTRAVENOUS
  Filled 2011-09-18: qty 1

## 2011-09-18 MED ORDER — METOCLOPRAMIDE HCL 5 MG/ML IJ SOLN
10.0000 mg | Freq: Once | INTRAMUSCULAR | Status: AC
  Administered 2011-09-18: 10 mg via INTRAVENOUS
  Filled 2011-09-18: qty 2

## 2011-09-18 MED ORDER — CYCLOBENZAPRINE HCL 10 MG PO TABS: 10.0000 mg | ORAL_TABLET | Freq: Two times a day (BID) | ORAL | Status: AC | PRN

## 2011-09-18 MED ORDER — DEXAMETHASONE SODIUM PHOSPHATE 4 MG/ML IJ SOLN
10.0000 mg | Freq: Once | INTRAMUSCULAR | Status: AC
  Administered 2011-09-18: 10 mg via INTRAVENOUS
  Filled 2011-09-18: qty 3

## 2011-09-18 MED ORDER — DIAZEPAM 5 MG PO TABS
5.0000 mg | ORAL_TABLET | Freq: Once | ORAL | Status: AC
  Administered 2011-09-18: 5 mg via ORAL
  Filled 2011-09-18: qty 1

## 2011-09-18 NOTE — ED Notes (Signed)
Patient discharged home with father.  Instructed not to drive today.  Follow up with MD in 2 days.  No questions or concerns at discharge

## 2011-09-18 NOTE — ED Notes (Signed)
Patient with headache in her "neck" that started two days ago.  Patient states that it is getting worse since yesterday, pain radiating down back into buttocks and legs.
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
## 2011-09-18 NOTE — Discharge Instructions (Signed)
Muscle Cramps  Muscle cramps are due to sudden involuntary muscle contraction. This means you have no control over the tightening of a muscle (or muscles). Often there are no obvious causes. Muscle cramps may occur with overexertion. They may also occur with chilling of the muscles. An example of a muscle chilling activity is swimming. It is uncommon for cramps to be due to a serious underlying disorder. In most cases, muscle cramps improve (or leave) within minutes.  CAUSES  Some common causes are:  Injury.  Infections, especially viral.  Abnormal levels of the salts and ions in your blood (electrolytes). This could happen if you are taking water pills (diuretics).  Blood vessel disease where not enough blood is getting to the muscles (intermittent claudication).  Some uncommon causes are:  Side effects of some medicine (such as lithium).  Alcohol abuse.  Diseases where there is soreness (inflammation) of the muscular system.  HOME CARE INSTRUCTIONS  It may be helpful to massage, stretch, and relax the affected muscle.  Taking a dose of over-the-counter diphenhydramine is helpful for night leg cramps.  SEEK MEDICAL CARE IF:  Cramps are frequent and not relieved with medicine.  MAKE SURE YOU:  Understand these instructions.  Will watch your condition.  Will get help right away if you are not doing well or get worse.

## 2011-09-19 ENCOUNTER — Telehealth: Payer: Self-pay | Admitting: Physical Medicine and Rehabilitation

## 2011-09-19 ENCOUNTER — Encounter: Payer: Self-pay | Admitting: Physical Medicine and Rehabilitation

## 2011-09-19 NOTE — Telephone Encounter (Signed)
Talked to patient today at

## 2011-09-19 NOTE — Telephone Encounter (Signed)
Patient was a no show today (6/28). She called after scheduled appt and asked to reschedule because she was at the ER.  Per Dr. Pamelia Hoit this was 2nd time to no show/cancel, I explained to patient that she would not see her and she than used explicit words to me and hung up.

## 2011-09-22 ENCOUNTER — Emergency Department (HOSPITAL_COMMUNITY)
Admission: EM | Admit: 2011-09-22 | Discharge: 2011-09-22 | Disposition: A | Discharge status: Home Health | Payer: Self-pay | Attending: Emergency Medicine | Admitting: Emergency Medicine

## 2011-09-22 ENCOUNTER — Encounter (HOSPITAL_COMMUNITY): Payer: Self-pay | Admitting: Emergency Medicine

## 2011-09-22 DIAGNOSIS — M1990 Unspecified osteoarthritis, unspecified site: Secondary | ICD-10-CM | POA: Insufficient documentation

## 2011-09-22 DIAGNOSIS — M549 Dorsalgia, unspecified: Secondary | ICD-10-CM | POA: Insufficient documentation

## 2011-09-22 MED ORDER — OXYCODONE-ACETAMINOPHEN 5-325 MG PO TABS: 1.0000 | ORAL_TABLET | ORAL | Status: AC | PRN

## 2011-09-22 MED ORDER — FLUCONAZOLE 150 MG PO TABS: 150.0000 mg | ORAL_TABLET | Freq: Once | ORAL | Status: AC

## 2011-09-22 MED ORDER — PREDNISONE 10 MG PO TABS: 20.0000 mg | ORAL_TABLET | Freq: Two times a day (BID) | ORAL | Status: DC

## 2011-09-22 NOTE — Discharge Instructions (Signed)
Degenerative Arthritis You have osteoarthritis. This is the wear and tear arthritis that comes with aging. It is also called degenerative arthritis. This is common in people past middle age. It is caused by stress on the joints. The large weight bearing joints of the lower extremities are most often affected. The knees, hips, back, neck, and hands can become painful, swollen, and stiff. This is the most common type of arthritis. It comes on with age, carrying too much weight, or from an injury. Treatment includes resting the sore joint until the pain and swelling improve. Crutches or a walker may be needed for severe flares. Only take over-the-counter or prescription medicines for pain, discomfort, or fever as directed by your caregiver. Local heat therapy may improve motion. Cortisone shots into the joint are sometimes used to reduce pain and swelling during flares. Osteoarthritis is usually not crippling and progresses slowly. There are things you can do to decrease pain:  Avoid high impact activities.   Exercise regularly.   Low impact exercises such as walking, biking and swimming help to keep the muscles strong and keep normal joint function.   Stretching helps to keep your range of motion.   Lose weight if you are overweight. This reduces joint stress.  In severe cases when you have pain at rest or increasing disability, joint surgery may be helpful. See your caregiver for follow-up treatment as recommended.  SEEK IMMEDIATE MEDICAL CARE IF:   You have severe joint pain.   Marked swelling and redness in your joint develops.   You develop a high fever.  Document Released: 03/10/2005 Document Revised: 02/27/2011 Document Reviewed: 08/10/2006 [facility] Patient Information 2012 [place], [place].Back Exercises Back exercises help treat and prevent back injuries. The goal is to increase your strength in your belly (abdominal) and back muscles. These exercises can also help with flexibility.  Start these exercises when told by your doctor. HOME CARE Back exercises include: Pelvic Tilt.  Lie on your back with your knees bent. Tilt your pelvis until the lower part of your back is against the floor. Hold this position 5 to 10 sec. Repeat this exercise 5 to 10 times.  Knee to Chest.  Pull 1 knee up against your chest and hold for 20 to 30 seconds. Repeat this with the other knee. This may be done with the other leg straight or bent, whichever feels better. Then, pull both knees up against your chest.  Sit-Ups or Curl-Ups.  Bend your knees 90 degrees. Start with tilting your pelvis, and do a partial, slow sit-up. Only lift your upper half 30 to 45 degrees off the floor. Take at least 2 to 3 seonds for each sit-up. Do not do sit-ups with your knees out straight. If partial sit-ups are difficult, simply do the above but with only tightening your belly (abdominal) muscles and holding it as told.  Hip-Lift.  Lie on your back with your knees flexed 90 degrees. Push down with your feet and shoulders as you raise your hips 2 inches off the floor. Hold for 10 seconds, repeat 5 to 10 times.  Back Arches.  Lie on your stomach. Prop yourself up on bent elbows. Slowly press on your hands, causing an arch in your low back. Repeat 3 to 5 times.  Shoulder-Lifts.  Lie face down with arms beside your body. Keep hips and belly pressed to floor as you slowly lift your head and shoulders off the floor.  Do not overdo your exercises. Be careful in the beginning.  Exercises may cause you some mild back discomfort. If the pain lasts for more than 15 minutes, stop the exercises until you see your doctor. Improvement with exercise for back problems is slow.  Document Released: 04/12/2010 Document Revised: 02/27/2011 Document Reviewed: 04/12/2010 [facility] Patient Information 2012 [place], [place].Back Pain, Adult Low back pain is very common. About 1 in 5 people have back pain.The cause of low back pain is  rarely dangerous. The pain often gets better over time.About half of people with a sudden onset of back pain feel better in just 2 weeks. About 8 in 10 people feel better by 6 weeks.  CAUSES Some common causes of back pain include:  Strain of the muscles or ligaments supporting the spine.   Wear and tear (degeneration) of the spinal discs.   Arthritis.   Direct injury to the back.  DIAGNOSIS Most of the time, the direct cause of low back pain is not known.However, back pain can be treated effectively even when the exact cause of the pain is unknown.Answering your caregiver's questions about your overall health and symptoms is one of the most accurate ways to make sure the cause of your pain is not dangerous. If your caregiver needs more information, he or she may order lab work or imaging tests (X-rays or MRIs).However, even if imaging tests show changes in your back, this usually does not require surgery. HOME CARE INSTRUCTIONS For many people, back pain returns.Since low back pain is rarely dangerous, it is often a condition that people can learn to [facility] their own.   Remain active. It is stressful on the back to sit or stand in one place. Do not sit, drive, or stand in one place for more than 30 minutes at a time. Take short walks on level surfaces as soon as pain allows.Try to increase the length of time you walk each day.   Do not stay in bed.Resting more than 1 or 2 days can delay your recovery.   Do not avoid exercise or work.Your body is made to move.It is not dangerous to be active, even though your back may hurt.Your back will likely heal faster if you return to being active before your pain is gone.   Pay attention to your body when you bend and lift. Many people have less discomfortwhen lifting if they bend their knees, keep the load close to their bodies,and avoid twisting. Often, the most comfortable positions are those that put less stress on your recovering  back.   Find a comfortable position to sleep. Use a firm mattress and lie on your side with your knees slightly bent. If you lie on your back, put a pillow under your knees.   Only take over-the-counter or prescription medicines as directed by your caregiver. Over-the-counter medicines to reduce pain and inflammation are often the most helpful.Your caregiver may prescribe muscle relaxant drugs.These medicines help dull your pain so you can more quickly return to your normal activities and healthy exercise.   Put ice on the injured area.   Put ice in a plastic bag.   Place a towel between your skin and the bag.   Leave the ice on for 15 to 20 minutes, 3 to 4 times a day for the first 2 to 3 days. After that, ice and heat may be alternated to reduce pain and spasms.   Ask your caregiver about trying back exercises and gentle massage. This may be of some benefit.   Avoid feeling anxious or stressed.Stress  increases muscle tension and can worsen back pain.It is important to recognize when you are anxious or stressed and learn ways to manage it.Exercise is a great option.  SEEK MEDICAL CARE IF:  You have pain that is not relieved with rest or medicine.   You have pain that does not improve in 1 week.   You have new symptoms.   You are generally not feeling well.  SEEK IMMEDIATE MEDICAL CARE IF:   You have pain that radiates from your back into your legs.   You develop new bowel or bladder control problems.   You have unusual weakness or numbness in your arms or legs.   You develop nausea or vomiting.   You develop abdominal pain.   You feel faint.  Document Released: 03/10/2005 Document Revised: 02/27/2011 Document Reviewed: 07/29/2010 [facility] Patient Information 2012 [place], [place].

## 2011-09-22 NOTE — ED Provider Notes (Signed)
History    This chart was scribed for Flint Melter, MD, MD by Smitty Pluck. The patient was seen in room [facility] and the patient's care was started at 1:04PM.   CSN: [id number]  Arrival date & time 09/22/11  1121   None     Chief Complaint  Patient presents with  . Back Pain    (Consider location/radiation/quality/duration/timing/severity/associated sxs/prior treatment) The history is provided by the patient.   Katie [name] is a 36 y.o. female who presents to the Emergency Department complaining of moderate lower back pain onset 2 days. Pt was at [facility] ED 1 week ago because she had pain in neck and was given flexeril. Walking aggravates pain. Pain is described as stabbing. Pain wakes pt at night. Denies injury to back. Pt reports having vaginal yeast infection with mild itching. There is no radiation. Pain is constant.  PCP is Dr. Philipp Deputy   Past Medical History  Diagnosis Date  . Fibromyalgia   . Migraine   . Hypertension   . Depression   . Anxiety     Past Surgical History  Procedure Date  . Tubal ligation     History reviewed. No pertinent family history.  History  Substance Use Topics  . Smoking status: Former Games developer  . Smokeless tobacco: Not on file  . Alcohol Use: Yes     occasional    OB History    Grav Para Term Preterm Abortions TAB SAB Ect Mult Living                  Review of Systems  Constitutional: Negative for fever and chills.  Respiratory: Negative for cough and shortness of breath.   Gastrointestinal: Negative for nausea, vomiting and diarrhea.  Genitourinary: Negative for dysuria and frequency.  Musculoskeletal: Positive for back pain.  All other systems reviewed and are negative.    Allergies  Scallops  Home Medications   Current Outpatient Rx  Name Route Sig Dispense Refill  . CLONAZEPAM 0.5 MG PO TABS Oral Take 0.5 mg by mouth 2 (two) times daily as needed. For nerves.    . CYCLOBENZAPRINE HCL 10 MG PO TABS Oral Take 1 tablet (10  mg total) by mouth 2 (two) times daily as needed for muscle spasms. 20 tablet 0  . ESOMEPRAZOLE MAGNESIUM 40 MG PO CPDR Oral Take 40 mg by mouth daily before breakfast.      . HYDROCODONE-ACETAMINOPHEN 5-325 MG PO TABS Oral Take 1 tablet by mouth every 6 (six) hours as needed. For pain.    Marland Kitchen LISINOPRIL 20 MG PO TABS Oral Take 20 mg by mouth daily.      . VENLAFAXINE HCL ER 150 MG PO CP24 Oral Take 150 mg by mouth daily.      Marland Kitchen FLUCONAZOLE 150 MG PO TABS Oral Take 1 tablet (150 mg total) by mouth once. 1 tablet 0  . OXYCODONE-ACETAMINOPHEN 5-325 MG PO TABS Oral Take 1 tablet by mouth every 4 (four) hours as needed for pain. 20 tablet 0  . PREDNISONE 10 MG PO TABS Oral Take 2 tablets (20 mg total) by mouth 2 (two) times daily. 10 tablet 0    BP 134/79  Pulse 109  Temp 98.6 F (37 C) (Oral)  Resp 18  SpO2 98%  LMP 09/01/2011  Physical Exam  Nursing note and vitals reviewed. Constitutional: She is oriented to person, place, and time. She appears well-developed and well-nourished. No distress.  HENT:  Head: Normocephalic and atraumatic.  Eyes: Conjunctivae  are normal. Pupils are equal, round, and reactive to light.  Neck: Normal range of motion. Neck supple.  Pulmonary/Chest: Effort normal. No respiratory distress.  Musculoskeletal:       Tenderness over sacrum positive straight leg raising at 45 degrees bilaterally   Neurological: She is alert and oriented to person, place, and time.  Skin: Skin is warm and dry.  Psychiatric: She has a normal mood and affect. Her behavior is normal.    ED Course  Procedures (including critical care time) DIAGNOSTIC STUDIES: Oxygen Saturation is 98% on room air, normal  by my interpretation.    COORDINATION OF CARE: 1:07PM EDP discusses pt ED treatment with pt.  She requested a Rx of Fluconazole  Labs Reviewed - No data to display No results found.   1. Back pain   2. 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 Doubt cauda equina.Doubt metabolic instability, serious bacterial infection or impending vascular collapse; the patient is stable for discharge.   Plan: Home Medications- Percocet, Prednisone, Fluconazole; Home Treatments- heat application to nack; Recommended follow up- PCP prn      I personally performed the services described in this documentation, which was scribed in my presence. The recorded information has been reviewed and considered.     Flint Melter, MD 09/23/11 [phone]

## 2011-09-22 NOTE — ED Notes (Signed)
Pt c/o lower back pain x 2 days; pt denies obvious injury

## 2011-10-17 ENCOUNTER — Ambulatory Visit: Payer: Self-pay | Admitting: Physical Medicine and Rehabilitation

## 2011-11-18 ENCOUNTER — Inpatient Hospital Stay (HOSPITAL_COMMUNITY)
Admission: EM | Admit: 2011-11-18 | Discharge: 2011-12-02 | DRG: 066 | Disposition: A | Discharge status: Home / Self Care | Payer: Medicaid Other | Attending: Neurosurgery | Admitting: Neurosurgery

## 2011-11-18 ENCOUNTER — Encounter (HOSPITAL_COMMUNITY): Payer: Self-pay | Admitting: *Deleted

## 2011-11-18 ENCOUNTER — Emergency Department (HOSPITAL_COMMUNITY): Payer: Medicaid Other

## 2011-11-18 DIAGNOSIS — I10 Essential (primary) hypertension: Secondary | ICD-10-CM | POA: Diagnosis present

## 2011-11-18 DIAGNOSIS — F3289 Other specified depressive episodes: Secondary | ICD-10-CM | POA: Diagnosis present

## 2011-11-18 DIAGNOSIS — F411 Generalized anxiety disorder: Secondary | ICD-10-CM | POA: Diagnosis present

## 2011-11-18 DIAGNOSIS — IMO0001 Reserved for inherently not codable concepts without codable children: Secondary | ICD-10-CM | POA: Diagnosis present

## 2011-11-18 DIAGNOSIS — I609 Nontraumatic subarachnoid hemorrhage, unspecified: Principal | ICD-10-CM | POA: Diagnosis present

## 2011-11-18 DIAGNOSIS — Z87891 Personal history of nicotine dependence: Secondary | ICD-10-CM

## 2011-11-18 DIAGNOSIS — F329 Major depressive disorder, single episode, unspecified: Secondary | ICD-10-CM | POA: Diagnosis present

## 2011-11-18 LAB — URINALYSIS, ROUTINE W REFLEX MICROSCOPIC
Glucose, UA: NEGATIVE mg/dL
Ketones, ur: 15 mg/dL — AB
Protein, ur: 100 mg/dL — AB

## 2011-11-18 LAB — CBC WITH DIFFERENTIAL/PLATELET
Basophils Absolute: 0 10*3/uL (ref 0.0–0.1)
Basophils Relative: 0 % (ref 0–1)
Eosinophils Relative: 0 % (ref 0–5)
HCT: 40.1 % (ref 36.0–46.0)
Lymphocytes Relative: 10 % — ABNORMAL LOW (ref 12–46)
MCH: 29.8 pg (ref 26.0–34.0)
MCHC: 34.7 g/dL (ref 30.0–36.0)
MCV: 86.1 fL (ref 78.0–100.0)
Monocytes Absolute: 1 10*3/uL (ref 0.1–1.0)
RDW: 12.7 % (ref 11.5–15.5)

## 2011-11-18 LAB — BASIC METABOLIC PANEL
CO2: 20 mEq/L (ref 19–32)
Calcium: 9.6 mg/dL (ref 8.4–10.5)
Creatinine, Ser: 0.55 mg/dL (ref 0.50–1.10)

## 2011-11-18 MED ORDER — MORPHINE SULFATE 4 MG/ML IJ SOLN
4.0000 mg | Freq: Once | INTRAMUSCULAR | Status: AC
  Administered 2011-11-18: 4 mg via INTRAVENOUS
  Filled 2011-11-18: qty 1

## 2011-11-18 MED ORDER — HYDRALAZINE HCL 20 MG/ML IJ SOLN
INTRAMUSCULAR | Status: AC | PRN
  Administered 2011-11-18 (×4): 5 mg via INTRAVENOUS

## 2011-11-18 MED ORDER — MIDAZOLAM HCL 5 MG/5ML IJ SOLN
INTRAMUSCULAR | Status: AC | PRN
  Administered 2011-11-18: 1 mg via INTRAVENOUS

## 2011-11-18 MED ORDER — DIPHENHYDRAMINE HCL 50 MG/ML IJ SOLN
INTRAMUSCULAR | Status: AC
  Administered 2011-11-18: 50 mg via INTRAVENOUS
  Filled 2011-11-18: qty 1

## 2011-11-18 MED ORDER — METOCLOPRAMIDE HCL 5 MG/ML IJ SOLN
10.0000 mg | Freq: Once | INTRAMUSCULAR | Status: AC
  Administered 2011-11-18: 10 mg via INTRAVENOUS
  Filled 2011-11-18: qty 2

## 2011-11-18 MED ORDER — HYDRALAZINE HCL 20 MG/ML IJ SOLN
INTRAMUSCULAR | Status: AC
  Administered 2011-11-18: 21:00:00
  Filled 2011-11-18: qty 1

## 2011-11-18 MED ORDER — MIDAZOLAM HCL 2 MG/2ML IJ SOLN
INTRAMUSCULAR | Status: AC
  Administered 2011-11-18: 20:00:00
  Filled 2011-11-18: qty 4

## 2011-11-18 MED ORDER — ONDANSETRON HCL 4 MG/2ML IJ SOLN
INTRAMUSCULAR | Status: AC
  Filled 2011-11-18: qty 2

## 2011-11-18 MED ORDER — SODIUM CHLORIDE 0.9 % IV BOLUS (SEPSIS)
1000.0000 mL | Freq: Once | INTRAVENOUS | Status: AC
  Administered 2011-11-18: 1000 mL via INTRAVENOUS

## 2011-11-18 MED ORDER — POTASSIUM CHLORIDE 10 MEQ/100ML IV SOLN
10.0000 meq | Freq: Once | INTRAVENOUS | Status: AC
  Administered 2011-11-18: 10 meq via INTRAVENOUS
  Filled 2011-11-18: qty 100

## 2011-11-18 MED ORDER — FAMOTIDINE IN NACL 20-0.9 MG/50ML-% IV SOLN
20.0000 mg | Freq: Once | INTRAVENOUS | Status: AC
  Administered 2011-11-18: 20 mg via INTRAVENOUS

## 2011-11-18 MED ORDER — FENTANYL CITRATE 0.05 MG/ML IJ SOLN
INTRAMUSCULAR | Status: AC
  Administered 2011-11-18: 20:00:00
  Filled 2011-11-18: qty 4

## 2011-11-18 MED ORDER — FENTANYL CITRATE 0.05 MG/ML IJ SOLN
INTRAMUSCULAR | Status: AC | PRN
  Administered 2011-11-18: 25 ug via INTRAVENOUS

## 2011-11-18 MED ORDER — ONDANSETRON HCL 4 MG/2ML IJ SOLN
4.0000 mg | Freq: Once | INTRAMUSCULAR | Status: AC
  Administered 2011-11-18: 4 mg via INTRAVENOUS

## 2011-11-18 MED ORDER — ONDANSETRON HCL 4 MG/2ML IJ SOLN
4.0000 mg | Freq: Once | INTRAMUSCULAR | Status: AC
  Administered 2011-11-18: 4 mg via INTRAVENOUS
  Filled 2011-11-18: qty 2

## 2011-11-18 MED ORDER — METHYLPREDNISOLONE SODIUM SUCC 125 MG IJ SOLR
INTRAMUSCULAR | Status: AC
  Administered 2011-11-18: 125 mg via INTRAVENOUS
  Filled 2011-11-18: qty 2

## 2011-11-18 MED ORDER — DIPHENHYDRAMINE HCL 50 MG/ML IJ SOLN
50.0000 mg | Freq: Once | INTRAMUSCULAR | Status: AC
  Administered 2011-11-18: 50 mg via INTRAVENOUS

## 2011-11-18 MED ORDER — METHYLPREDNISOLONE SODIUM SUCC 125 MG IJ SOLR
125.0000 mg | Freq: Once | INTRAMUSCULAR | Status: AC
  Administered 2011-11-18: 125 mg via INTRAVENOUS

## 2011-11-18 MED ORDER — MORPHINE SULFATE 2 MG/ML IJ SOLN
2.0000 mg | Freq: Once | INTRAMUSCULAR | Status: AC
  Administered 2011-11-18: 2 mg via INTRAVENOUS
  Filled 2011-11-18: qty 1

## 2011-11-18 NOTE — ED Provider Notes (Addendum)
History     CSN: [id number]  Arrival date & time 11/18/11  1554   First MD Initiated Contact with Patient 11/18/11 1634      Chief Complaint  Patient presents with  . Nausea  . Fall  . Loss of Consciousness    (Consider location/radiation/quality/duration/timing/severity/associated sxs/prior treatment) HPI Pt present to the ED with family at bedside who report she was in her normal state of health this afternoon when she got off the couch and collapsed on the floor. She was unsreponsive and ?seizure for a few minutes resolved spontaneously. Now complaining of severe frontal and posterior headache, nausea and photophobia. Similar to previous migraines/tension headaches.  Past Medical History  Diagnosis Date  . Fibromyalgia   . Migraine   . Hypertension   . Depression   . Anxiety     Past Surgical History  Procedure Date  . Tubal ligation     No family history on file.  History  Substance Use Topics  . Smoking status: Former Games developer  . Smokeless tobacco: Not on file  . Alcohol Use: Yes     occasional    OB History    Grav Para Term Preterm Abortions TAB SAB Ect Mult Living                  Review of Systems All other systems reviewed and are negative except as noted in HPI.   Allergies  Scallops  Home Medications   Current Outpatient Rx  Name Route Sig Dispense Refill  . CLONAZEPAM 0.5 MG PO TABS Oral Take 0.5 mg by mouth 2 (two) times daily as needed. For nerves.    Marland Kitchen ESOMEPRAZOLE MAGNESIUM 40 MG PO CPDR Oral Take 40 mg by mouth daily before breakfast.      . HYDROCODONE-ACETAMINOPHEN 5-325 MG PO TABS Oral Take 1 tablet by mouth every 6 (six) hours as needed. For pain.    Marland Kitchen LISINOPRIL 20 MG PO TABS Oral Take 20 mg by mouth daily.      Marland Kitchen PREDNISONE 10 MG PO TABS Oral Take 2 tablets (20 mg total) by mouth 2 (two) times daily. 10 tablet 0  . VENLAFAXINE HCL ER 150 MG PO CP24 Oral Take 150 mg by mouth daily.        BP 147/82  Pulse 66  Temp 97.2 F (36.2  C) (Oral)  Resp 20  SpO2 100%  LMP 11/11/2011  Physical Exam  Nursing note and vitals reviewed. Constitutional: She is oriented to person, place, and time. She appears well-developed and well-nourished.  HENT:  Head: Normocephalic and atraumatic.  Eyes: EOM are normal. Pupils are equal, round, and reactive to light.  Neck: Normal range of motion. Neck supple.  Cardiovascular: Normal rate, normal heart sounds and intact distal pulses.   Pulmonary/Chest: Effort normal and breath sounds normal.  Abdominal: Bowel sounds are normal. She exhibits no distension. There is no tenderness.  Musculoskeletal: Normal range of motion. She exhibits no edema and no tenderness.  Neurological: She is alert and oriented to person, place, and time. She has normal strength. No cranial nerve deficit or sensory deficit.  Skin: Skin is warm and dry. No rash noted.  Psychiatric: She has a normal mood and affect.    ED Course  Procedures (including critical care time)  Labs Reviewed  CBC WITH DIFFERENTIAL - Abnormal; Notable for the following:    WBC 16.2 (*)     Neutrophils Relative 84 (*)     Neutro Abs 13.6 (*)  Lymphocytes Relative 10 (*)     All other components within normal limits  BASIC METABOLIC PANEL - Abnormal; Notable for the following:    Potassium 2.8 (*)     Glucose, Bld 174 (*)     All other components within normal limits  URINALYSIS, ROUTINE W REFLEX MICROSCOPIC - Abnormal; Notable for the following:    Ketones, ur 15 (*)     Protein, ur 100 (*)     Leukocytes, UA SMALL (*)     All other components within normal limits  URINE MICROSCOPIC-ADD ON - Abnormal; Notable for the following:    Squamous Epithelial / LPF FEW (*)     Bacteria, UA FEW (*)     All other components within normal limits  PROTIME-INR  APTT   Ct Head Wo Contrast  11/18/2011  *RADIOLOGY REPORT*  Clinical Data: Severe headache.  CT HEAD WITHOUT CONTRAST  Technique:  Contiguous axial images were obtained from  the base of the skull through the vertex without contrast.  Comparison: None.  Findings: Hemorrhage identified throughout the ventricular system. Source for the bleed is not seen.  No subdural hematoma is identified.  No mass lesion, mass effect, midline shift or hydrocephalus is present.  The calvarium is intact.  IMPRESSION: Hemorrhage throughout the ventricular system.  Source for blood is identified.Critical Value/emergent results were called by telephone at the time of interpretation on 11/18/2011 at 6:10 p.m. to Dr. Bernette Mayers, who verbally acknowledged these results.   Original Report Authenticated By: Bernadene Bell. Maricela Curet, M.D.      No diagnosis found.    MDM  CT images reviewed, note that there is a transcription error in the CT report, no source is identified. Dr. Maricela Curet notified. I also discussed with Dr. Jeral Fruit on call for Neurosurgery who asked for an emergent angiogram. Dr. Corliss Skains to see in the ED and take to IR.     Date: 11/18/2011  Rate: 72  Rhythm: normal sinus rhythm  QRS Axis: normal  Intervals: QT prolonged  ST/T Wave abnormalities: normal  Conduction Disutrbances:none  Narrative Interpretation:   Old EKG Reviewed: none available     Charles B. Bernette Mayers, MD 11/18/11 2035

## 2011-11-18 NOTE — ED Notes (Signed)
Pt uncooperative, lethargic, answers questions after much prodding. A&OX4.  Always keeping eyes closed. Reports she has a migraine with pain to across forehead & base of head. C/o n/v, emesis x1 , photophobia. States this feels like her usual migraines.  States she took something for her migraine but cannot recall what. Strength equal BUE, BLE

## 2011-11-18 NOTE — ED Notes (Signed)
Husband reports pt had a syncopal episode while sitting in a chair. Denied CP, palpitations. Upon awakening pt c/o h/a & nausea.

## 2011-11-18 NOTE — ED Notes (Signed)
Pt vomited on floor, zofran ordered per protocol

## 2011-11-18 NOTE — ED Notes (Signed)
Pt got up off of couch and went to her knees and then fell to face.  Pt was able to roll her self on her back.  Nasal trumpet was inserted and removed by ems.  Pt awake on arrival .

## 2011-11-18 NOTE — ED Notes (Signed)
Patient to IR for Angio Internal Carotid

## 2011-11-18 NOTE — Procedures (Signed)
S/P 4 vessel cerebral arteriogram RT CFA approach.. Findings.. Fast flow RT parietal AVM  With drainage into the vein of Galen and the post one third of the superior sagittal sinus. No aneurysms seen.

## 2011-11-19 LAB — COMPREHENSIVE METABOLIC PANEL
ALT: 15 U/L (ref 0–35)
AST: 19 U/L (ref 0–37)
Alkaline Phosphatase: 65 U/L (ref 39–117)
CO2: 21 mEq/L (ref 19–32)
Chloride: 105 mEq/L (ref 96–112)
GFR calc non Af Amer: >90 mL/min (ref >90)
Glucose, Bld: 116 mg/dL — ABNORMAL HIGH (ref 70–99)
Sodium: 139 mEq/L (ref 135–145)
Total Bilirubin: 0.3 mg/dL (ref 0.3–1.2)

## 2011-11-19 LAB — MRSA PCR SCREENING: MRSA by PCR: NEGATIVE

## 2011-11-19 MED ORDER — SENNOSIDES-DOCUSATE SODIUM 8.6-50 MG PO TABS
1.0000 | ORAL_TABLET | Freq: Two times a day (BID) | ORAL | Status: DC
  Administered 2011-11-19 – 2011-12-01 (×22): 1 via ORAL
  Filled 2011-11-19 (×28): qty 1

## 2011-11-19 MED ORDER — LABETALOL HCL 5 MG/ML IV SOLN
10.0000 mg | INTRAVENOUS | Status: DC | PRN
  Administered 2011-11-19: 10 mg via INTRAVENOUS
  Administered 2011-11-21 – 2011-11-24 (×10): 20 mg via INTRAVENOUS
  Administered 2011-11-28: 10 mg via INTRAVENOUS
  Filled 2011-11-19 (×12): qty 4

## 2011-11-19 MED ORDER — MORPHINE SULFATE 2 MG/ML IJ SOLN
2.0000 mg | Freq: Once | INTRAMUSCULAR | Status: AC
  Administered 2011-11-19: 2 mg via INTRAVENOUS

## 2011-11-19 MED ORDER — SODIUM CHLORIDE 0.9 % IV SOLN
INTRAVENOUS | Status: DC
  Administered 2011-11-19 (×2): via INTRAVENOUS

## 2011-11-19 MED ORDER — SODIUM CHLORIDE 0.9 % IV SOLN
INTRAVENOUS | Status: DC
  Administered 2011-11-19 – 2011-11-29 (×11): via INTRAVENOUS

## 2011-11-19 MED ORDER — OXYCODONE-ACETAMINOPHEN 5-325 MG PO TABS
1.0000 | ORAL_TABLET | ORAL | Status: DC | PRN
  Administered 2011-11-19 – 2011-11-23 (×20): 2 via ORAL
  Administered 2011-11-24: 1 via ORAL
  Administered 2011-11-24 – 2011-12-02 (×26): 2 via ORAL
  Filled 2011-11-19 (×48): qty 2

## 2011-11-19 MED ORDER — ONDANSETRON HCL 4 MG/2ML IJ SOLN
4.0000 mg | Freq: Four times a day (QID) | INTRAMUSCULAR | Status: DC | PRN
Start: 2011-11-19 — End: 2011-12-02
  Administered 2011-11-19 – 2011-11-26 (×6): 4 mg via INTRAVENOUS
  Filled 2011-11-19 (×6): qty 2

## 2011-11-19 MED ORDER — MORPHINE SULFATE 4 MG/ML IJ SOLN
4.0000 mg | INTRAMUSCULAR | Status: DC | PRN
  Administered 2011-11-19 (×2): 4 mg via INTRAVENOUS
  Filled 2011-11-19 (×2): qty 1

## 2011-11-19 MED ORDER — MORPHINE SULFATE 2 MG/ML IJ SOLN
INTRAMUSCULAR | Status: AC
  Filled 2011-11-19: qty 1

## 2011-11-19 MED ORDER — DIAZEPAM 5 MG/ML IJ SOLN
5.0000 mg | Freq: Four times a day (QID) | INTRAMUSCULAR | Status: DC | PRN
  Administered 2011-11-19 – 2011-11-24 (×18): 5 mg via INTRAVENOUS
  Filled 2011-11-19 (×15): qty 2

## 2011-11-19 MED ORDER — PANTOPRAZOLE SODIUM 40 MG PO TBEC
40.0000 mg | DELAYED_RELEASE_TABLET | Freq: Every day | ORAL | Status: DC
  Administered 2011-11-20: 40 mg via ORAL
  Filled 2011-11-19: qty 1

## 2011-11-19 MED ORDER — PANTOPRAZOLE SODIUM 40 MG IV SOLR
40.0000 mg | Freq: Every day | INTRAVENOUS | Status: DC
  Administered 2011-11-19: 40 mg via INTRAVENOUS
  Filled 2011-11-19 (×2): qty 40

## 2011-11-19 MED ORDER — SODIUM CHLORIDE 0.9 % IV SOLN: INTRAVENOUS | Status: AC

## 2011-11-19 MED ORDER — ACETAMINOPHEN 650 MG RE SUPP: 650.0000 mg | RECTAL | Status: DC | PRN

## 2011-11-19 MED ORDER — MORPHINE SULFATE 4 MG/ML IJ SOLN
4.0000 mg | INTRAMUSCULAR | Status: DC | PRN
  Administered 2011-11-19 – 2011-11-30 (×40): 4 mg via INTRAVENOUS
  Filled 2011-11-19 (×41): qty 1

## 2011-11-19 MED ORDER — DIAZEPAM 5 MG/ML IJ SOLN
5.0000 mg | Freq: Three times a day (TID) | INTRAMUSCULAR | Status: DC | PRN
  Administered 2011-11-19: 5 mg via INTRAVENOUS
  Filled 2011-11-19: qty 2

## 2011-11-19 MED ORDER — DIAZEPAM 5 MG/ML IJ SOLN
INTRAMUSCULAR | Status: AC
  Filled 2011-11-19: qty 2

## 2011-11-19 MED ORDER — ACETAMINOPHEN 325 MG PO TABS
650.0000 mg | ORAL_TABLET | ORAL | Status: DC | PRN
  Administered 2011-11-19 – 2011-11-28 (×3): 650 mg via ORAL
  Filled 2011-11-19 (×3): qty 2

## 2011-11-19 MED ORDER — POTASSIUM CHLORIDE 10 MEQ/100ML IV SOLN
10.0000 meq | INTRAVENOUS | Status: AC
  Administered 2011-11-19 (×6): 10 meq via INTRAVENOUS
  Filled 2011-11-19 (×6): qty 100

## 2011-11-19 NOTE — Progress Notes (Signed)
  Subjective: Still with headache; especially with movement and light exposure. Feels a lot of tension in her back and buttocks - valium not helping.   Objective: Vital signs in last 24 hours: Temp:  [97.2 F (36.2 C)-98.2 F (36.8 C)] 98 F (36.7 C) (08/28 0800) Pulse Rate:  [59-105] 84  (08/28 1000) Resp:  [14-29] 24  (08/28 1000) BP: (124-183)/(70-109) 135/81 mmHg (08/28 1000) SpO2:  [96 %-100 %] 97 % (08/28 1000) Weight:  [173 lb 4.5 oz (78.6 kg)] 173 lb 4.5 oz (78.6 kg) (08/28 0000)    Intake/Output from previous day: 08/27 0701 - 08/28 0700 In: 1118.8 [I.V.:518.8; IV Piggyback:600] Out: 1650 [Urine:1650] Intake/Output this shift: Total I/O In: 150 [I.V.:150] Out: -   PE:  Awake, alert, family present.  Speech clear, tongue midline. Moving all extremities and following commands. Holding head. Groin site clean and dry without hematoma and distal pulses intact.   Lab Results:   [facility] 11/18/11 1741  WBC 16.2*  HGB 13.9  HCT 40.1  PLT 292   BMET  Basename 11/18/11 1741  NA 138  K 2.8*  CL 101  CO2 20  GLUCOSE 174*  BUN 11  CREATININE 0.55  CALCIUM 9.6   PT/INR  Basename 11/18/11 1819  LABPROT 14.7  INR 1.13   Studies/Results: Ct Head Wo Contrast  11/18/2011  **ADDENDUM** CREATED: 11/18/2011 20:34:07  This addendum is given for the purpose of correcting the second sentence in the impression.  That sentence should read,  "Source for blood is not identified."  **END ADDENDUM** SIGNED BY: Maisie Fus L. Maricela Curet, M.D.   11/18/2011  *RADIOLOGY REPORT*  Clinical Data: Severe headache.  CT HEAD WITHOUT CONTRAST  Technique:  Contiguous axial images were obtained from the base of the skull through the vertex without contrast.  Comparison: None.  Findings: Hemorrhage identified throughout the ventricular system. Source for the bleed is not seen.  No subdural hematoma is identified.  No mass lesion, mass effect, midline shift or hydrocephalus is present.  The calvarium  is intact.  IMPRESSION: Hemorrhage throughout the ventricular system.  Source for blood is identified.Critical Value/emergent results were called by telephone at the time of interpretation on 11/18/2011 at 6:10 p.m. to Dr. Bernette Mayers, who verbally acknowledged these results.   Original Report Authenticated By: Bernadene Bell. Maricela Curet, M.D.     Anti-infectives: Anti-infectives    None      Assessment/Plan: Headache with decreased LOC - AVM noted on angiogram.  For Dr. Franky Macho to re-evaluate - possible repeat angiogram in next few days vs [facility] transfer per spouse.  NIR to follow along with neurosurgery as needed.    LOS: 1 day    Letisia Schwalb D 11/19/2011

## 2011-11-19 NOTE — Progress Notes (Signed)
Subjective: Patient reports pain  Objective: Vital signs in last 24 hours: Temp:  [97.2 F (36.2 C)-98.2 F (36.8 C)] 98 F (36.7 C) (08/28 0800) Pulse Rate:  [59-105] 90  (08/28 1300) Resp:  [14-29] 20  (08/28 1300) BP: (124-183)/(70-109) 149/104 mmHg (08/28 1300) SpO2:  [95 %-100 %] 95 % (08/28 1300) Weight:  [78.6 kg (173 lb 4.5 oz)] 78.6 kg (173 lb 4.5 oz) (08/28 0000)  Intake/Output from previous day: 08/27 0701 - 08/28 0700 In: 1118.8 [I.V.:518.8; IV Piggyback:600] Out: 1650 [Urine:1650] Intake/Output this shift: Total I/O In: 550 [I.V.:550] Out: -   C/o neck stiffness and pain. Cn, wnl. Spoke with her husband about findings and plan. Dr Franky Macho to take over         Katie [name] M 11/19/2011, 2:04 PM

## 2011-11-19 NOTE — H&P (Signed)
Katie [name] is an 36 y.o. female.   Chief Complaint: headache HPI 36 y/o female brought to the hospital with sudden onset of headache and decrease of loc. Ct head was done and showed sah with ventricular hemorrhage . An emergency cerebral angiogram was done and admitted to the neuro icu. At present she is awake with the c/o of neck stiffness Past Medical History  Diagnosis Date  . Fibromyalgia   . Migraine   . Hypertension   . Depression   . Anxiety     Past Surgical History  Procedure Date  . Tubal ligation     No family history on file. Social History:  reports that she has quit smoking. She does not have any smokeless tobacco history on file. She reports that she drinks alcohol. She reports that she does not use illicit drugs.  Allergies:  Allergies  Allergen Reactions  . Scallops (Shellfish Allergy) Hives and Nausea And Vomiting    Medications Prior to Admission  Medication Sig Dispense Refill  . clonazePAM (KLONOPIN) 0.5 MG tablet Take 0.5 mg by mouth 2 (two) times daily as needed. For nerves.      Marland Kitchen esomeprazole (NEXIUM) 40 MG capsule Take 40 mg by mouth daily before breakfast.        . HYDROcodone-acetaminophen (NORCO) 5-325 MG per tablet Take 1 tablet by mouth every 6 (six) hours as needed. For pain.      Marland Kitchen lisinopril (PRINIVIL,ZESTRIL) 20 MG tablet Take 20 mg by mouth daily.        Marland Kitchen venlafaxine (EFFEXOR-XR) 150 MG 24 hr capsule Take 150 mg by mouth daily.        . predniSONE (DELTASONE) 10 MG tablet Take 2 tablets (20 mg total) by mouth 2 (two) times daily.  10 tablet  0    Results for orders placed during the hospital encounter of 11/18/11 (from the past 48 hour(s))  URINALYSIS, ROUTINE W REFLEX MICROSCOPIC     Status: Abnormal   Collection Time   11/18/11  5:33 PM      Component Value Range Comment   Color, Urine YELLOW  YELLOW    APPearance CLEAR  CLEAR    Specific Gravity, Urine 1.018  1.005 - 1.030    pH 7.0  5.0 - 8.0    Glucose, UA NEGATIVE  NEGATIVE  mg/dL    Hgb urine dipstick NEGATIVE  NEGATIVE    Bilirubin Urine NEGATIVE  NEGATIVE    Ketones, ur 15 (*) NEGATIVE mg/dL    Protein, ur 161 (*) NEGATIVE mg/dL    Urobilinogen, UA 0.2  0.0 - 1.0 mg/dL    Nitrite NEGATIVE  NEGATIVE    Leukocytes, UA SMALL (*) NEGATIVE   URINE MICROSCOPIC-ADD ON     Status: Abnormal   Collection Time   11/18/11  5:33 PM      Component Value Range Comment   Squamous Epithelial / LPF FEW (*) RARE    WBC, UA 3-6  <3 WBC/hpf    Bacteria, UA FEW (*) RARE   CBC WITH DIFFERENTIAL     Status: Abnormal   Collection Time   11/18/11  5:41 PM      Component Value Range Comment   WBC 16.2 (*) 4.0 - 10.5 K/uL    RBC 4.66  3.87 - 5.11 MIL/uL    Hemoglobin 13.9  12.0 - 15.0 g/dL    HCT 09.6  04.5 - 40.9 %    MCV 86.1  78.0 - 100.0 fL  MCH 29.8  26.0 - 34.0 pg    MCHC 34.7  30.0 - 36.0 g/dL    RDW 09.8  11.9 - 14.7 %    Platelets 292  150 - 400 K/uL    Neutrophils Relative 84 (*) 43 - 77 %    Neutro Abs 13.6 (*) 1.7 - 7.7 K/uL    Lymphocytes Relative 10 (*) 12 - 46 %    Lymphs Abs 1.6  0.7 - 4.0 K/uL    Monocytes Relative 6  3 - 12 %    Monocytes Absolute 1.0  0.1 - 1.0 K/uL    Eosinophils Relative 0  0 - 5 %    Eosinophils Absolute 0.0  0.0 - 0.7 K/uL    Basophils Relative 0  0 - 1 %    Basophils Absolute 0.0  0.0 - 0.1 K/uL   BASIC METABOLIC PANEL     Status: Abnormal   Collection Time   11/18/11  5:41 PM      Component Value Range Comment   Sodium 138  135 - 145 mEq/L    Potassium 2.8 (*) 3.5 - 5.1 mEq/L    Chloride 101  96 - 112 mEq/L    CO2 20  19 - 32 mEq/L    Glucose, Bld 174 (*) 70 - 99 mg/dL    BUN 11  6 - 23 mg/dL    Creatinine, Ser 8.29  0.50 - 1.10 mg/dL    Calcium 9.6  8.4 - 56.2 mg/dL    GFR calc non Af Amer >90  >90 mL/min    GFR calc Af Amer >90  >90 mL/min   PROTIME-INR     Status: Normal   Collection Time   11/18/11  6:19 PM      Component Value Range Comment   Prothrombin Time 14.7  11.6 - 15.2 seconds    INR 1.13  0.00 - 1.49     APTT     Status: Normal   Collection Time   11/18/11  6:19 PM      Component Value Range Comment   aPTT 30  24 - 37 seconds    Ct Head Wo Contrast  11/18/2011  **ADDENDUM** CREATED: 11/18/2011 20:34:07  This addendum is given for the purpose of correcting the second sentence in the impression.  That sentence should read,  "Source for blood is not identified."  **END ADDENDUM** SIGNED BY: Maisie Fus L. Maricela Curet, M.D.   11/18/2011  *RADIOLOGY REPORT*  Clinical Data: Severe headache.  CT HEAD WITHOUT CONTRAST  Technique:  Contiguous axial images were obtained from the base of the skull through the vertex without contrast.  Comparison: None.  Findings: Hemorrhage identified throughout the ventricular system. Source for the bleed is not seen.  No subdural hematoma is identified.  No mass lesion, mass effect, midline shift or hydrocephalus is present.  The calvarium is intact.  IMPRESSION: Hemorrhage throughout the ventricular system.  Source for blood is identified.Critical Value/emergent results were called by telephone at the time of interpretation on 11/18/2011 at 6:10 p.m. to Dr. Bernette Mayers, who verbally acknowledged these results.   Original Report Authenticated By: Bernadene Bell. Maricela Curet, M.D.     Review of Systems  Constitutional: Negative.   HENT: Positive for hearing loss, nosebleeds and neck pain.   Eyes: Negative.   Respiratory: Negative.   Cardiovascular: Negative.   Genitourinary: Negative.   Skin: Negative.   Neurological: Positive for loss of consciousness.  Endo/Heme/Allergies: Negative.   Psychiatric/Behavioral: Negative.  Blood pressure 151/75, pulse 86, temperature 98 F (36.7 C), temperature source Oral, resp. rate 19, height 5\' 1"  (1.549 m), weight 78.6 kg (173 lb 4.5 oz), last menstrual period 11/11/2011, SpO2 99.00%. Physical Exam hent,nl. Neck, stiffness with pain to movement. Cv, nl. Lungs, clear. Abdomen, nl. Extremities,nl. NEURO orientyed x 2, cn ,wnl. Strength nl. Sensory,  nl. Dtr, nl. Cerebral angiogram showed right AVM  Assessment/Plan PATIENT TO BE IN THE ICU.  We are going to treat and monitor her.aT PREENT THERE IS NO NEED TO INSERT   A  Ventricular catheter but will repeat ct in 24 to 48 hours. In 2 weeks will repeat the angio with the possibility of embolizarion and/or surgery. Tomorrow i will ask DRCABBELL TO TAKE OVER HER CARE. i DID TRY TO talk to her husband but no answer at [phone] Damarcus Reggio M 11/19/2011, 12:35 AM

## 2011-11-20 LAB — CBC
HCT: 38 % (ref 36.0–46.0)
Hemoglobin: 12.4 g/dL (ref 12.0–15.0)
MCH: 29 pg (ref 26.0–34.0)
MCHC: 32.6 g/dL (ref 30.0–36.0)
MCV: 88.8 fL (ref 78.0–100.0)
RDW: 13.3 % (ref 11.5–15.5)

## 2011-11-20 MED ORDER — ESOMEPRAZOLE MAGNESIUM 40 MG PO CPDR
40.0000 mg | DELAYED_RELEASE_CAPSULE | Freq: Every day | ORAL | Status: DC
  Administered 2011-11-21 – 2011-12-02 (×12): 40 mg via ORAL
  Filled 2011-11-20 (×16): qty 1

## 2011-11-20 MED ORDER — VENLAFAXINE HCL ER 150 MG PO CP24
150.0000 mg | ORAL_CAPSULE | Freq: Every day | ORAL | Status: DC
  Administered 2011-11-20 – 2011-12-02 (×13): 150 mg via ORAL
  Filled 2011-11-20 (×13): qty 1

## 2011-11-20 MED ORDER — NON FORMULARY: 40.0000 mg | Freq: Every day | Status: DC

## 2011-11-20 MED ORDER — LISINOPRIL 20 MG PO TABS
20.0000 mg | ORAL_TABLET | Freq: Every day | ORAL | Status: DC
  Administered 2011-11-20 – 2011-12-02 (×13): 20 mg via ORAL
  Filled 2011-11-20 (×13): qty 1

## 2011-11-20 NOTE — Progress Notes (Signed)
Patient ID: Katie [name], female   DOB: 05-06-1975, 36 y.o.   MRN: [id number] Neuro unchanged. C/o neck pain. Vs wnl. Spoke with her husband. Ct head in am

## 2011-11-20 NOTE — Progress Notes (Signed)
  Subjective: Severe ha to ER Cerebral arteriogram 8/27 reveals AVM For CT head in am  Objective: Vital signs in last 24 hours: Temp:  [98.4 F (36.9 C)-98.8 F (37.1 C)] 98.4 F (36.9 C) (08/29 0800) Pulse Rate:  [62-117] 85  (08/29 1000) Resp:  [12-25] 21  (08/29 1000) BP: (129-166)/(64-105) 143/81 mmHg (08/29 1000) SpO2:  [90 %-100 %] 95 % (08/29 1000)    Intake/Output from previous day: 08/28 0701 - 08/29 0700 In: 3010 [P.O.:60; I.V.:2950] Out: 975 [Urine:975] Intake/Output this shift: Total I/O In: 300 [I.V.:300] Out: 400 [Urine:400]  PE:  Pt resting with eyes closed Still with headache Afeb; vss Quiet; a/o Face symmetrical; tongue midline Follows all commands   Lab Results:   [facility] 11/20/11 0924 11/18/11 1741  WBC 17.7* 16.2*  HGB 12.4 13.9  HCT 38.0 40.1  PLT 259 292   BMET  Basename 11/19/11 1210 11/18/11 1741  NA 139 138  K 3.4* 2.8*  CL 105 101  CO2 21 20  GLUCOSE 116* 174*  BUN 10 11  CREATININE 0.47* 0.55  CALCIUM 9.4 9.6   PT/INR  Basename 11/18/11 1819  LABPROT 14.7  INR 1.13   ABG No results found for this basename: PHART:2,PCO2:2,PO2:2,HCO3:2 in the last 72 hours  Studies/Results: Ct Head Wo Contrast  11/18/2011  **ADDENDUM** CREATED: 11/18/2011 20:34:07  This addendum is given for the purpose of correcting the second sentence in the impression.  That sentence should read,  "Source for blood is not identified."  **END ADDENDUM** SIGNED BY: Maisie Fus L. Maricela Curet, M.D.   11/18/2011  *RADIOLOGY REPORT*  Clinical Data: Severe headache.  CT HEAD WITHOUT CONTRAST  Technique:  Contiguous axial images were obtained from the base of the skull through the vertex without contrast.  Comparison: None.  Findings: Hemorrhage identified throughout the ventricular system. Source for the bleed is not seen.  No subdural hematoma is identified.  No mass lesion, mass effect, midline shift or hydrocephalus is present.  The calvarium is intact.   IMPRESSION: Hemorrhage throughout the ventricular system.  Source for blood is identified.Critical Value/emergent results were called by telephone at the time of interpretation on 11/18/2011 at 6:10 p.m. to Dr. Bernette Mayers, who verbally acknowledged these results.   Original Report Authenticated By: Bernadene Bell. Maricela Curet, M.D.     Anti-infectives: Anti-infectives    None      Assessment/Plan: s/p Cerebral arteriogram 8/27: AVM Pt states father has " a brain aneurysm" no tx; watching Will follow Report to Dr Corliss Skains Will need repeat Angio in few weeks For CT Head in am per Dr Jeral Fruit Home soon?  Mallerie Blok A 11/20/2011

## 2011-11-21 ENCOUNTER — Inpatient Hospital Stay (HOSPITAL_COMMUNITY): Payer: Medicaid Other

## 2011-11-21 NOTE — Progress Notes (Signed)
Patient ID: Katie [name], female   DOB: Oct 02, 1975, 36 y.o.   MRN: [id number] Stable, less headache. Ct head no increase of ventricles

## 2011-11-21 NOTE — Progress Notes (Signed)
  Subjective: No new acute changes. Still with headache.   Objective: Vital signs in last 24 hours: Temp:  [97.4 F (36.3 C)-98.2 F (36.8 C)] 97.4 F (36.3 C) (08/30 0700) Pulse Rate:  [65-97] 77  (08/30 0700) Resp:  [13-25] 20  (08/30 0700) BP: (140-176)/(84-115) 175/92 mmHg (08/30 0700) SpO2:  [87 %-100 %] 96 % (08/30 0700)    Intake/Output from previous day: 08/29 0701 - 08/30 0700 In: 2120 [P.O.:120; I.V.:2000] Out: 2475 [Urine:2475]  PE:  Unchanged. Remains A & O. Following all commands with equal movements and strength of all 4 extremities. Clear speech.   Lab Results:   [facility] 11/20/11 0924 11/18/11 1741  WBC 17.7* 16.2*  HGB 12.4 13.9  HCT 38.0 40.1  PLT 259 292   BMET  Basename 11/19/11 1210 11/18/11 1741  NA 139 138  K 3.4* 2.8*  CL 105 101  CO2 21 20  GLUCOSE 116* 174*  BUN 10 11  CREATININE 0.47* 0.55  CALCIUM 9.4 9.6   PT/INR  Basename 11/18/11 1819  LABPROT 14.7  INR 1.13    Studies/Results: Ct Head Wo Contrast  11/21/2011  *RADIOLOGY REPORT*  Clinical Data: 36 year old female with sudden onset headache and decreased level of consciousness.  Intracranial hemorrhage.  Right parietal AVM.  CT HEAD WITHOUT CONTRAST  Technique:  Contiguous axial images were obtained from the base of the skull through the vertex without contrast.  Comparison: Cerebral angiogram 11/18/2011.  Head CT 11/18/2011.  Findings: Visualized paranasal sinuses and mastoids are clear.  No acute osseous abnormality identified.  Visualized orbits and scalp soft tissues are within normal limits.  The volume of intraventricular hemorrhage is decreased.  No ventriculomegaly.  Hyperdense probable draining vein associated with the right posterior hemisphere AVM identified on series 2 image 15.  Asymmetric hyperdensity of the right choroid plexus may also be associated with the AVM.  No midline shift or mass effect.  There may be a diffuse degree of mild cerebral edema giving continued  nonvisualization of most sulci.  No other intracranial hemorrhage identified. No evidence of cortically based acute infarction identified.  IMPRESSION: There may be a mild degree of residual cerebral edema, but no mass effect, no ventriculomegaly, and intraventricular hemorrhage volume has decreased since 11/18/2011. No new intracranial abnormality. The cerebral angiogram 11/18/2011 regarding the right posterior hemisphere AVM.   Original Report Authenticated By: Harley Hallmark, M.D.     Assessment/Plan:  AVM per angiogram with improved IVH volume per CT. Patient has remained neurologically stable. For repeat angiogram with embolization prior to AVM surgery with [facility] soon. Will schedule once Dr. Sueanne Margarita surgical date has been elected. NIR to continue to follow while IP.   LOS: 3 days    CAMPBELL,PAMELA D 11/21/2011

## 2011-11-21 NOTE — Progress Notes (Signed)
Dr. Franky Macho confronted with patient's complaint that she doesn't feel she is emptying her bladder. Bladder scanned, which resulted, in a post void residual of . He was instructed of fluid type and rate. Orders obtained to perform in and out catheterization and monitor for 6 hours to see how she voids. Also, her MIVF were reduced from to 75ml. Will continue to monitor.

## 2011-11-22 NOTE — Progress Notes (Signed)
Subjective: Patient resting in bed, continues to have photophobia, continues to have significant headache, requiring parenteral narcotic analgesics. Nursing staff reported significant post for residual of over 500 cc and requested a Foley catheter, order given.  Objective: Vital signs in last 24 hours: Filed Vitals:   11/22/11 0445 11/22/11 0500 11/22/11 0600 11/22/11 0700  BP: 143/82 167/91 137/98 152/103  Pulse: 89 76 82 104  Temp:    97.5 F (36.4 C)  TempSrc:    Oral  Resp: 19     Height:      Weight:   82.8 kg (182 lb 8.7 oz)   SpO2: 97% 97% 98% 98%    Intake/Output from previous day: 08/30 0701 - 08/31 0700 In: 2700 [P.O.:600; I.V.:2100] Out: 1355 [Urine:1355] Intake/Output this shift:    Physical Exam:  Awake and alert, oriented. Pupils equal round and reactive to light and about 3 mm bilaterally. Extraocular movements intact. Facial movements symmetrical. Moving all 4 extremities well. No drift of upper extremities.   Studies/Results: Ct Head Wo Contrast  11/21/2011  *RADIOLOGY REPORT*  Clinical Data: 36 year old female with sudden onset headache and decreased level of consciousness.  Intracranial hemorrhage.  Right parietal AVM.  CT HEAD WITHOUT CONTRAST  Technique:  Contiguous axial images were obtained from the base of the skull through the vertex without contrast.  Comparison: Cerebral angiogram 11/18/2011.  Head CT 11/18/2011.  Findings: Visualized paranasal sinuses and mastoids are clear.  No acute osseous abnormality identified.  Visualized orbits and scalp soft tissues are within normal limits.  The volume of intraventricular hemorrhage is decreased.  No ventriculomegaly.  Hyperdense probable draining vein associated with the right posterior hemisphere AVM identified on series 2 image 15.  Asymmetric hyperdensity of the right choroid plexus may also be associated with the AVM.  No midline shift or mass effect.  There may be a diffuse degree of mild cerebral edema giving  continued nonvisualization of most sulci.  No other intracranial hemorrhage identified. No evidence of cortically based acute infarction identified.  IMPRESSION: There may be a mild degree of residual cerebral edema, but no mass effect, no ventriculomegaly, and intraventricular hemorrhage volume has decreased since 11/18/2011. No new intracranial abnormality. The cerebral angiogram 11/18/2011 regarding the right posterior hemisphere AVM.   Original Report Authenticated By: Harley Hallmark, M.D.     Assessment::  Continues to have significant headache. We'll continue supportive care.    Hewitt Shorts, MD 11/22/2011, 8:46 AM

## 2011-11-22 NOTE — Progress Notes (Signed)
Spoke with Dr. Newell Coral about patient's post void residual of 511 cc and her frequent urinating. Received order to place foley catheter.

## 2011-11-23 MED ORDER — DEXAMETHASONE SODIUM PHOSPHATE 10 MG/ML IJ SOLN
INTRAMUSCULAR | Status: AC
  Administered 2011-11-23: 4 mg
  Filled 2011-11-23: qty 1

## 2011-11-23 MED ORDER — OXYBUTYNIN CHLORIDE 5 MG PO TABS
5.0000 mg | ORAL_TABLET | Freq: Two times a day (BID) | ORAL | Status: DC | PRN
  Administered 2011-11-23: 5 mg via ORAL
  Filled 2011-11-23: qty 1

## 2011-11-23 MED ORDER — POLYVINYL ALCOHOL 1.4 % OP SOLN
1.0000 [drp] | OPHTHALMIC | Status: DC | PRN
  Administered 2011-11-23 – 2011-11-24 (×2): 1 [drp] via OPHTHALMIC
  Filled 2011-11-23: qty 15

## 2011-11-23 MED ORDER — DEXAMETHASONE SODIUM PHOSPHATE 4 MG/ML IJ SOLN
4.0000 mg | Freq: Two times a day (BID) | INTRAMUSCULAR | Status: DC
  Administered 2011-11-23 – 2011-11-24 (×3): 4 mg via INTRAVENOUS
  Filled 2011-11-23 (×4): qty 1

## 2011-11-23 NOTE — Progress Notes (Signed)
Subjective: Patient reports headache a bit better.  Still complaining of photophobia.  Objective: Vital signs in last 24 hours: Temp:  [97.4 F (36.3 C)-99 F (37.2 C)] 98.9 F (37.2 C) (09/01 0831) Pulse Rate:  [69-116] 90  (09/01 0831) Resp:  [12-27] 13  (09/01 0831) BP: (115-174)/(56-106) 154/98 mmHg (09/01 0831) SpO2:  [92 %-98 %] 94 % (09/01 0831)  Intake/Output from previous day: 08/31 0701 - 09/01 0700 In: 2335 [P.O.:600; I.V.:1725; IV Piggyback:10] Out: 3558 [Urine:3558] Intake/Output this shift: Total I/O In: 150 [I.V.:150] Out: -   Physical Exam: Prefers the dark, has been on bedrest.  Alert, conversant.  MAEW.   Lab Results: No results found for this basename: WBC:2,HGB:2,HCT:2,PLT:2 in the last 72 hours BMET No results found for this basename: NA:2,K:2,CL:2,CO2:2,GLUCOSE:2,BUN:2,CREATININE:2,CALCIUM:2 in the last 72 hours  Studies/Results: No results found.  Assessment/Plan: I have encouraged patient to get OOB.  Continue decadron for headache and ICU observation.    LOS: 5 days    Dorian Heckle, MD 11/23/2011, 10:12 AM

## 2011-11-23 NOTE — Progress Notes (Signed)
Patient demonstrated right CN VI palsy, with pupils equal and reactive. Dr. Venetia Maxon contacted concerning change. Instructed to closely monitor for increased headache and decreased level of consciousness for possible CT of head. Will closely monitor.

## 2011-11-24 MED ORDER — DIAZEPAM 5 MG PO TABS
5.0000 mg | ORAL_TABLET | Freq: Four times a day (QID) | ORAL | Status: DC | PRN
  Administered 2011-11-24 – 2011-12-02 (×21): 5 mg via ORAL
  Filled 2011-11-24 (×22): qty 1

## 2011-11-24 MED ORDER — OXYCODONE HCL 5 MG PO TABS
10.0000 mg | ORAL_TABLET | ORAL | Status: DC | PRN
  Administered 2011-11-24 – 2011-12-02 (×26): 10 mg via ORAL
  Filled 2011-11-24 (×6): qty 2
  Filled 2011-11-24: qty 1
  Filled 2011-11-24 (×5): qty 2
  Filled 2011-11-24: qty 1
  Filled 2011-11-24 (×14): qty 2

## 2011-11-24 MED ORDER — DEXAMETHASONE 4 MG PO TABS
4.0000 mg | ORAL_TABLET | Freq: Two times a day (BID) | ORAL | Status: DC
  Administered 2011-11-24 – 2011-11-28 (×8): 4 mg via ORAL
  Filled 2011-11-24 (×9): qty 1

## 2011-11-24 NOTE — Progress Notes (Signed)
Subjective: Patient reports intermittant double vision  Objective: Vital signs in last 24 hours: Temp:  [97.4 F (36.3 C)-99.3 F (37.4 C)] 98.6 F (37 C) (09/02 0400) Pulse Rate:  [78-123] 110  (09/02 0500) Resp:  [10-25] 18  (09/02 0500) BP: (125-161)/(73-105) 133/92 mmHg (09/02 0500) SpO2:  [81 %-99 %] 95 % (09/02 0500)  Intake/Output from previous day: 09/01 0701 - 09/02 0700 In: 2610 [P.O.:960; I.V.:1650] Out: 1555 [Urine:1555] Intake/Output this shift: Total I/O In: 750 [I.V.:750] Out: 825 [Urine:825]  Physical Exam: Reportedly VI nerve palsy yesterday with dysconjugate gaze.  Currently PERRL, EOMI. No diplopia or cranial nerve palsies appreciated.  No drift.  Lab Results: No results found for this basename: WBC:2,HGB:2,HCT:2,PLT:2 in the last 72 hours BMET No results found for this basename: NA:2,K:2,CL:2,CO2:2,GLUCOSE:2,BUN:2,CREATININE:2,CALCIUM:2 in the last 72 hours  Studies/Results: No results found.  Assessment/Plan: D/C foley.  Increase strength of po percocet with goal of discontinuing iv morphine. Observe in ICU, given fluctuating neurologic exam.    LOS: 6 days    Dorian Heckle, MD 11/24/2011, 6:46 AM

## 2011-11-24 NOTE — Progress Notes (Signed)
Removed foley at this time. Will continue to monitor for urinary output

## 2011-11-25 NOTE — Progress Notes (Signed)
Patient ID: Katie [name], female   DOB: [birth year], 36 y.o.   MRN: [id number] More awake,decrease of headache. C/o black spot in right eye no weakness. Plan ct head in 48 hours

## 2011-11-26 ENCOUNTER — Inpatient Hospital Stay (HOSPITAL_COMMUNITY): Payer: Medicaid Other

## 2011-11-26 NOTE — Progress Notes (Signed)
Patient ID: Katie [name], female   DOB: [birth year], 36 y.o.   MRN: [id number] Doing well. Neuro stable. Plan to have ct head today

## 2011-11-26 NOTE — Progress Notes (Signed)
Patient has documented scallop allergy. Patient requesting ceasar salad. Service response denying patient request until MD order given for ceasar salad. MD ok with patient having ceasar salad.

## 2011-11-27 NOTE — Progress Notes (Signed)
Notified Attending On-Call of pt's head and neck pained unrelieved by current PRN pain medication regimen. Given pt's unchanged neurological status, MD would like to refrain from adding additional pain medications to pt's MAR at this time. Will continue to monitor.

## 2011-11-27 NOTE — Progress Notes (Signed)
Patient ID: Katie [name], female   DOB: 1975/05/02, 36 y.o.   MRN: [id number] C/o of worsening of ha. Neuro stable, vs wnl.

## 2011-11-28 ENCOUNTER — Inpatient Hospital Stay (HOSPITAL_COMMUNITY): Payer: Medicaid Other

## 2011-11-28 NOTE — Progress Notes (Signed)
Blood pressure this AM is 161/97 in the right arm. Patient received Labetalol 10mg  IV.

## 2011-11-28 NOTE — Progress Notes (Signed)
Patient bwing transferred to 4N room 27 via wheelchair. Phone report called to Tracy,rn. Patient and family members present in room aware of the transfer. Patient just received some Valium due to becoming anxious over some personal matters at home. No further questions or concerns voiced at this time

## 2011-11-28 NOTE — Progress Notes (Signed)
Patient ID: Katie [name], female   DOB: 10-17-1975, 36 y.o.   MRN: [id number] Still with headache ,getting iv morphine. To gert cht head today and transfer to 4n7
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
## 2011-11-29 ENCOUNTER — Inpatient Hospital Stay (HOSPITAL_COMMUNITY): Payer: Medicaid Other

## 2011-11-29 ENCOUNTER — Other Ambulatory Visit (HOSPITAL_COMMUNITY): Payer: Self-pay

## 2011-11-29 MED ORDER — BISACODYL 10 MG RE SUPP
10.0000 mg | Freq: Every day | RECTAL | Status: DC | PRN
  Administered 2011-11-29 – 2011-12-01 (×3): 10 mg via RECTAL
  Filled 2011-11-29 (×3): qty 1

## 2011-11-29 NOTE — Progress Notes (Signed)
Patient ID: Katie [name], female   DOB: September 06, 1975, 36 y.o.   MRN: [id number] C/o of headache. No neck stiffness.vs, wnl

## 2011-11-30 NOTE — Progress Notes (Signed)
Patient ID: Katie [name], female   DOB: [birth year], 36 y.o.   MRN: [id number] Talked to her and her mother.  Aware that the ct head is showing resolution of bleeding. To be off morphine

## 2011-12-01 NOTE — Progress Notes (Signed)
Patient ID: Katie [name], female   DOB: 1976-01-22, 36 y.o.   MRN: [id number] Still with a lot of headache. Ct bleed resolved. Plan to be dc in am ant to call for a f/u with dr Franky Macho who will take care of her case

## 2011-12-02 NOTE — Discharge Summary (Signed)
Physician Discharge Summary  Patient ID: Jacoby Beirne MRN: [id number] DOB/AGE: [birth year] 36 y.o.  Admit date: 11/18/2011 Discharge date: 12/02/2011  Admission Diagnoses:brain AVM with [facility]  Discharge Diagnoses: SAME   Discharged Condition:  DECREASE HA  Hospital Course: cerebral angiogram. observation       Consults: IR Significant Diagnostic Studies:CT CEREBRAL ANGIO  Treatments:OBSERVATION  Discharge Exam: Blood pressure 105/61, pulse 119, temperature 98.2 F (36.8 C), temperature source Oral, resp. rate 18, height 5\' 1"  (1.549 m), weight 82.1 kg (181 lb), last menstrual period 11/27/2011, SpO2 98.00%. Neuro nl  Disposition: home on roxicodona. DR Ronaldo Miyamoto CABBELL TO TAKE OVER HER CARE. TO SEE HIM IN 2 TO 3 WEEKS   Medication List  As of 12/02/2011 10:39 AM   ASK your doctor about these medications         clonazePAM 0.5 MG tablet   Commonly known as: KLONOPIN   Take 0.5 mg by mouth 2 (two) times daily as needed. For nerves.      esomeprazole 40 MG capsule   Commonly known as: NEXIUM   Take 40 mg by mouth daily before breakfast. Dispense as written      HYDROcodone-acetaminophen 5-325 MG per tablet   Commonly known as: NORCO/VICODIN   Take 1 tablet by mouth every 6 (six) hours as needed. For pain.      lisinopril 20 MG tablet   Commonly known as: PRINIVIL,ZESTRIL   Take 20 mg by mouth daily.      predniSONE 10 MG tablet   Commonly known as: DELTASONE   Take 2 tablets (20 mg total) by mouth 2 (two) times daily.      venlafaxine XR 150 MG 24 hr capsule   Commonly known as: EFFEXOR-XR   Take 150 mg by mouth daily.           Follow-up Information    Follow up with Social Security Administration. (Schedule appointment or go to office to apply for disability)    Contact information:   [address] [place], [place] [address]  [phone]      Call [facility]. (Call or make appointment for assistance with applying for disability)    Contact  information:   [address] [place], [place] [address]  [phone]         Signed: Karn Cassis 12/02/2011, 10:39 AM

## 2011-12-02 NOTE — Care Management Note (Unsigned)
    Page 1 of 1   12/02/2011     2:43:05 PM   CARE MANAGEMENT NOTE 12/02/2011  Patient:  Katie [name], Katie [name]   Account Number:  [id number]  Date Initiated:  11/25/2011  Documentation initiated by:  Jacquelynn Cree  Subjective/Objective Assessment:   Admitted with [facility] with ventricular hemorrhage.     Action/Plan:   Anticipated DC Date:  11/28/2011   Anticipated DC Plan:  HOME/SELF CARE      DC Planning Services  CM consult      Choice offered to / List presented to:             Status of service:  Completed, signed off Medicare Important Message given?   (If response is "NO", the following Medicare IM given date fields will be blank) Date Medicare IM given:   Date Additional Medicare IM given:    Discharge Disposition:  HOME/SELF CARE  Per UR Regulation:  Reviewed for med. necessity/level of care/duration of stay  If discussed at Long Length of Stay Meetings, dates discussed:   12/02/2011    Comments:

## 2011-12-02 NOTE — Progress Notes (Signed)
Pt was dc home with prescriptions and discharge information, patient stable, neuro stable.

## 2011-12-08 ENCOUNTER — Other Ambulatory Visit (HOSPITAL_COMMUNITY): Payer: Self-pay | Admitting: Neurosurgery

## 2011-12-08 DIAGNOSIS — Q2730 Arteriovenous malformation, site unspecified: Secondary | ICD-10-CM

## 2011-12-10 ENCOUNTER — Inpatient Hospital Stay (HOSPITAL_COMMUNITY): Admission: RE | Admit: 2011-12-10 | Payer: Self-pay | Source: Ambulatory Visit

## 2011-12-10 ENCOUNTER — Ambulatory Visit (HOSPITAL_COMMUNITY)
Admission: RE | Admit: 2011-12-10 | Discharge: 2011-12-10 | Disposition: A | Discharge status: Home / Self Care | Payer: Self-pay | Source: Ambulatory Visit | Attending: Neurosurgery | Admitting: Neurosurgery

## 2011-12-10 DIAGNOSIS — R51 Headache: Secondary | ICD-10-CM | POA: Insufficient documentation

## 2011-12-10 DIAGNOSIS — Z8673 Personal history of transient ischemic attack (TIA), and cerebral infarction without residual deficits: Secondary | ICD-10-CM | POA: Insufficient documentation

## 2011-12-10 DIAGNOSIS — Q283 Other malformations of cerebral vessels: Secondary | ICD-10-CM | POA: Insufficient documentation

## 2011-12-10 DIAGNOSIS — Q2730 Arteriovenous malformation, site unspecified: Secondary | ICD-10-CM

## 2011-12-10 MED ORDER — GADOBENATE DIMEGLUMINE 529 MG/ML IV SOLN
17.0000 mL | Freq: Once | INTRAVENOUS | Status: AC | PRN
  Administered 2011-12-10: 17 mL via INTRAVENOUS

## 2011-12-15 ENCOUNTER — Ambulatory Visit: Payer: Self-pay | Admitting: Physical Medicine and Rehabilitation

## 2011-12-31 ENCOUNTER — Emergency Department (HOSPITAL_COMMUNITY)
Admission: EM | Admit: 2011-12-31 | Discharge: 2011-12-31 | Disposition: A | Discharge status: Home / Self Care | Payer: Medicaid Other | Attending: Emergency Medicine | Admitting: Emergency Medicine

## 2011-12-31 ENCOUNTER — Emergency Department (HOSPITAL_COMMUNITY): Payer: Medicaid Other

## 2011-12-31 ENCOUNTER — Encounter (HOSPITAL_COMMUNITY): Payer: Self-pay | Admitting: Emergency Medicine

## 2011-12-31 DIAGNOSIS — H53149 Visual discomfort, unspecified: Secondary | ICD-10-CM | POA: Insufficient documentation

## 2011-12-31 DIAGNOSIS — R51 Headache: Secondary | ICD-10-CM

## 2011-12-31 DIAGNOSIS — Z79899 Other long term (current) drug therapy: Secondary | ICD-10-CM | POA: Insufficient documentation

## 2011-12-31 DIAGNOSIS — Q283 Other malformations of cerebral vessels: Secondary | ICD-10-CM | POA: Insufficient documentation

## 2011-12-31 DIAGNOSIS — Q282 Arteriovenous malformation of cerebral vessels: Secondary | ICD-10-CM

## 2011-12-31 DIAGNOSIS — I10 Essential (primary) hypertension: Secondary | ICD-10-CM | POA: Insufficient documentation

## 2011-12-31 LAB — CBC WITH DIFFERENTIAL/PLATELET
Basophils Absolute: 0 10*3/uL (ref 0.0–0.1)
Basophils Relative: 0 % (ref 0–1)
Eosinophils Absolute: 0 10*3/uL (ref 0.0–0.7)
Eosinophils Relative: 1 % (ref 0–5)
HCT: 33.9 % — ABNORMAL LOW (ref 36.0–46.0)
Hemoglobin: 11.1 g/dL — ABNORMAL LOW (ref 12.0–15.0)
Lymphocytes Relative: 35 % (ref 12–46)
Lymphs Abs: 1.6 10*3/uL (ref 0.7–4.0)
MCH: 28.8 pg (ref 26.0–34.0)
MCHC: 32.7 g/dL (ref 30.0–36.0)
MCV: 87.8 fL (ref 78.0–100.0)
Monocytes Absolute: 0.4 10*3/uL (ref 0.1–1.0)
Monocytes Relative: 8 % (ref 3–12)
Neutro Abs: 2.5 10*3/uL (ref 1.7–7.7)
Neutrophils Relative %: 56 % (ref 43–77)
Platelets: 189 10*3/uL (ref 150–400)
RBC: 3.86 MIL/uL — ABNORMAL LOW (ref 3.87–5.11)
RDW: 13.6 % (ref 11.5–15.5)
WBC: 4.5 10*3/uL (ref 4.0–10.5)

## 2011-12-31 LAB — BASIC METABOLIC PANEL
BUN: 6 mg/dL (ref 6–23)
CO2: 26 mEq/L (ref 19–32)
Chloride: 106 mEq/L (ref 96–112)
Creatinine, Ser: 0.66 mg/dL (ref 0.50–1.10)
Glucose, Bld: 81 mg/dL (ref 70–99)

## 2011-12-31 MED ORDER — HYDROCODONE-ACETAMINOPHEN 5-325 MG PO TABS: 1.0000 | ORAL_TABLET | ORAL | Status: DC | PRN

## 2011-12-31 MED ORDER — METOCLOPRAMIDE HCL 5 MG/ML IJ SOLN
10.0000 mg | Freq: Once | INTRAMUSCULAR | Status: AC
  Administered 2011-12-31: 10 mg via INTRAVENOUS
  Filled 2011-12-31: qty 2

## 2011-12-31 MED ORDER — DEXAMETHASONE SODIUM PHOSPHATE 10 MG/ML IJ SOLN
10.0000 mg | Freq: Once | INTRAMUSCULAR | Status: AC
  Administered 2011-12-31: 10 mg via INTRAVENOUS
  Filled 2011-12-31: qty 1

## 2011-12-31 MED ORDER — KETOROLAC TROMETHAMINE 30 MG/ML IJ SOLN
30.0000 mg | Freq: Once | INTRAMUSCULAR | Status: AC
  Administered 2011-12-31: 30 mg via INTRAVENOUS
  Filled 2011-12-31: qty 1

## 2011-12-31 MED ORDER — SODIUM CHLORIDE 0.9 % IV BOLUS (SEPSIS)
1000.0000 mL | Freq: Once | INTRAVENOUS | Status: AC
  Administered 2011-12-31: 1000 mL via INTRAVENOUS

## 2011-12-31 NOTE — ED Provider Notes (Addendum)
History     CSN: [id number]  Arrival date & time 12/31/11  1124   First MD Initiated Contact with Patient 12/31/11 1202      No chief complaint on file.   (Consider location/radiation/quality/duration/timing/severity/associated sxs/prior treatment) HPI Comments: 36 y.o woman comes in with cc of headaches. Pt has hx of AVM, with mild hemorrhage in the recent past. Pt states that her current headache started on Sunday, and it has gradually progressed from mild to severe. Now the headache is constant, severe, located on the posterior and frontal aspect, with some photophobia. The pain is described as throbbing pain. No neck pain, stiffness, no no nausea, vomiting, seizures, altered mental status, loss of consciousness, new weakness, no gait instability. Pt reports one occasion of some numbness over her neck.   The history is provided by the patient.    Past Medical History  Diagnosis Date  . Fibromyalgia   . Migraine   . Hypertension   . Depression   . Anxiety     Past Surgical History  Procedure Date  . Tubal ligation     No family history on file.  History  Substance Use Topics  . Smoking status: Former Games developer  . Smokeless tobacco: Not on file  . Alcohol Use: Yes     occasional    OB History    Grav Para Term Preterm Abortions TAB SAB Ect Mult Living                  Review of Systems  Constitutional: Negative for activity change.  HENT: Negative for facial swelling and neck pain.   Eyes: Positive for photophobia. Negative for pain and visual disturbance.  Respiratory: Negative for cough, shortness of breath and wheezing.   Cardiovascular: Negative for chest pain.  Gastrointestinal: Negative for nausea, vomiting, abdominal pain, diarrhea, constipation, blood in stool and abdominal distention.  Genitourinary: Negative for hematuria and difficulty urinating.  Skin: Negative for color change.  Neurological: Positive for speech difficulty, numbness and  headaches. Negative for seizures, syncope, facial asymmetry and light-headedness.  Hematological: Does not bruise/bleed easily.  Psychiatric/Behavioral: Negative for confusion.    Allergies  Scallops  Home Medications   Current Outpatient Rx  Name Route Sig Dispense Refill  . CLONAZEPAM 0.5 MG PO TABS Oral Take 0.5 mg by mouth 2 (two) times daily as needed. For nerves.    Marland Kitchen ESOMEPRAZOLE MAGNESIUM 40 MG PO CPDR Oral Take 40 mg by mouth daily before breakfast. Dispense as written    . HYDROCODONE-ACETAMINOPHEN 5-325 MG PO TABS Oral Take 1 tablet by mouth every 6 (six) hours as needed. For pain.    Marland Kitchen LEVETIRACETAM 500 MG PO TABS Oral Take 500 mg by mouth every 12 (twelve) hours.    Marland Kitchen LISINOPRIL 20 MG PO TABS Oral Take 20 mg by mouth daily.      . VENLAFAXINE HCL ER 150 MG PO CP24 Oral Take 150 mg by mouth daily.        BP 124/94  Pulse 117  Temp 98.3 F (36.8 C) (Oral)  Resp 16  Ht 5\' 1"  (1.549 m)  Wt 180 lb (81.647 kg)  BMI 34.01 kg/m2  SpO2 98%  LMP 12/24/2011  Physical Exam  Nursing note and vitals reviewed. Constitutional: She is oriented to person, place, and time. She appears well-developed and well-nourished.  HENT:  Head: Normocephalic and atraumatic.  Eyes: EOM are normal. Pupils are equal, round, and reactive to light.       5  mm and equal, with some horizontal nystagmus  Neck: Neck supple.  Cardiovascular: Normal rate, regular rhythm and normal heart sounds.   No murmur heard. Pulmonary/Chest: Effort normal. No respiratory distress.  Abdominal: Soft. She exhibits no distension. There is no tenderness. There is no rebound and no guarding.  Neurological: She is alert and oriented to person, place, and time. She has normal reflexes. She displays normal reflexes. No cranial nerve deficit. She exhibits normal muscle tone. Coordination normal.  Skin: Skin is warm and dry.    ED Course  Procedures (including critical care time)  Labs Reviewed  CBC WITH DIFFERENTIAL  - Abnormal; Notable for the following:    RBC 3.86 (*)     Hemoglobin 11.1 (*)     HCT 33.9 (*)     All other components within normal limits  BASIC METABOLIC PANEL   Ct Head Wo Contrast  12/31/2011  *RADIOLOGY REPORT*  Clinical Data: Worsening headache.  Photophobia.  CT HEAD WITHOUT CONTRAST  Technique:  Contiguous axial images were obtained from the base of the skull through the vertex without contrast.  Comparison: MR brain 12/11/2011 and CT of 11/29/2011.  Findings: No evidence of acute infarct, acute hemorrhage, mass lesion, mass effect or hydrocephalus.  A known arteriovenous malformation is better seen on 12/11/2011.  Visualized portions of the paranasal sinuses and mastoid air cells are clear.  IMPRESSION:  1.  No acute intracranial abnormality. 2.  Known AVM in the atrium of the right lateral ventricle is better visualized on 12/10/2011.   Original Report Authenticated By: Reyes Ivan, M.D.      No diagnosis found.    MDM  DDX includes: Primary headaches - including migrainous headaches, cluster headaches, tension headaches. ICH Carotid dissection Cavernous sinus thrombosis Meningitis Encephalitis Sinusitis Tumor Vascular headaches AV malformation Brain aneurysm Muscular headaches  A/P: Pt comes in with cc of headaches. Hx of brain aneurysm with hemorrhage. At that time she had come in with cc of syncope, and currently no syncope, just headaches. Neuro exam is WNL this time. No meningeal signs, no characteristics of a primary headache (although complex migrain possible). Will get CT head, and try to control sx.    Derwood Kaplan, MD 12/31/11 1537  3:51 PM  Headaches improved, though not completely resolved. Pt has an appointment with a neurosurgeon on Fro[facility] at [facility]. i think at this time, we can continue symptom control and have her f/u. Headache warning signs discussed.   Derwood Kaplan, MD 12/31/11 1556

## 2011-12-31 NOTE — ED Notes (Signed)
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

## 2012-01-28 ENCOUNTER — Emergency Department (HOSPITAL_COMMUNITY): Payer: Self-pay

## 2012-01-28 ENCOUNTER — Emergency Department (HOSPITAL_COMMUNITY)
Admission: EM | Admit: 2012-01-28 | Discharge: 2012-01-28 | Disposition: A | Discharge status: Home / Self Care | Payer: Self-pay | Attending: Emergency Medicine | Admitting: Emergency Medicine

## 2012-01-28 ENCOUNTER — Encounter (HOSPITAL_COMMUNITY): Payer: Self-pay | Admitting: Emergency Medicine

## 2012-01-28 DIAGNOSIS — M542 Cervicalgia: Secondary | ICD-10-CM | POA: Insufficient documentation

## 2012-01-28 DIAGNOSIS — R110 Nausea: Secondary | ICD-10-CM | POA: Insufficient documentation

## 2012-01-28 DIAGNOSIS — F329 Major depressive disorder, single episode, unspecified: Secondary | ICD-10-CM | POA: Insufficient documentation

## 2012-01-28 DIAGNOSIS — R51 Headache: Secondary | ICD-10-CM

## 2012-01-28 DIAGNOSIS — Z79899 Other long term (current) drug therapy: Secondary | ICD-10-CM | POA: Insufficient documentation

## 2012-01-28 DIAGNOSIS — Q2730 Arteriovenous malformation, site unspecified: Secondary | ICD-10-CM

## 2012-01-28 DIAGNOSIS — F3289 Other specified depressive episodes: Secondary | ICD-10-CM | POA: Insufficient documentation

## 2012-01-28 DIAGNOSIS — Q2572 Congenital pulmonary arteriovenous malformation: Secondary | ICD-10-CM | POA: Insufficient documentation

## 2012-01-28 DIAGNOSIS — Z87891 Personal history of nicotine dependence: Secondary | ICD-10-CM | POA: Insufficient documentation

## 2012-01-28 DIAGNOSIS — H53149 Visual discomfort, unspecified: Secondary | ICD-10-CM | POA: Insufficient documentation

## 2012-01-28 DIAGNOSIS — I10 Essential (primary) hypertension: Secondary | ICD-10-CM | POA: Insufficient documentation

## 2012-01-28 DIAGNOSIS — G43909 Migraine, unspecified, not intractable, without status migrainosus: Secondary | ICD-10-CM | POA: Insufficient documentation

## 2012-01-28 DIAGNOSIS — IMO0001 Reserved for inherently not codable concepts without codable children: Secondary | ICD-10-CM | POA: Insufficient documentation

## 2012-01-28 DIAGNOSIS — H9209 Otalgia, unspecified ear: Secondary | ICD-10-CM | POA: Insufficient documentation

## 2012-01-28 DIAGNOSIS — F411 Generalized anxiety disorder: Secondary | ICD-10-CM | POA: Insufficient documentation

## 2012-01-28 HISTORY — DX: Arteriovenous malformation, site unspecified: Q27.30

## 2012-01-28 LAB — BASIC METABOLIC PANEL
BUN: 14 mg/dL (ref 6–23)
Creatinine, Ser: 0.78 mg/dL (ref 0.50–1.10)
GFR calc Af Amer: >90 mL/min (ref >90)
GFR calc non Af Amer: >90 mL/min (ref >90)
Glucose, Bld: 85 mg/dL (ref 70–99)

## 2012-01-28 LAB — CBC
HCT: 37 % (ref 36.0–46.0)
Hemoglobin: 12.2 g/dL (ref 12.0–15.0)
MCH: 28.4 pg (ref 26.0–34.0)
MCHC: 33 g/dL (ref 30.0–36.0)
MCV: 86 fL (ref 78.0–100.0)
RDW: 13.6 % (ref 11.5–15.5)

## 2012-01-28 MED ORDER — HYDROMORPHONE HCL PF 1 MG/ML IJ SOLN
1.0000 mg | Freq: Once | INTRAMUSCULAR | Status: AC
  Administered 2012-01-28: 1 mg via INTRAVENOUS
  Filled 2012-01-28: qty 1

## 2012-01-28 MED ORDER — HYDROMORPHONE HCL PF 1 MG/ML IJ SOLN
1.0000 mg | Freq: Once | INTRAMUSCULAR | Status: AC
  Administered 2012-01-28: 1 mg via INTRAMUSCULAR
  Filled 2012-01-28: qty 1

## 2012-01-28 MED ORDER — OXYCODONE-ACETAMINOPHEN 5-325 MG PO TABS: 1.0000 | ORAL_TABLET | ORAL | Status: DC | PRN

## 2012-01-28 MED ORDER — ONDANSETRON 4 MG PO TBDP
4.0000 mg | ORAL_TABLET | Freq: Once | ORAL | Status: AC
  Administered 2012-01-28: 4 mg via ORAL
  Filled 2012-01-28: qty 1

## 2012-01-28 NOTE — ED Provider Notes (Signed)
Medical screening examination/treatment/procedure(s) were performed by non-physician practitioner and as supervising physician I was immediately available for consultation/collaboration.    Nelia Shi, MD 01/28/12 1850

## 2012-01-28 NOTE — ED Provider Notes (Signed)
History     CSN: [id number]  Arrival date & time 01/28/12  1441   First MD Initiated Contact with Patient 01/28/12 1610      No chief complaint on file.   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 Headache is constant described as "tingling" sensation on the top of her head rated 9/10. Aleve provides very slight relief. Admits to associated nausea as of this morning without vomiting. This feels similar to the headache she had 1 month ago when there were no changes to her AVM seen on CT scan. She does not remember how she felt back in August since she "just dropped". Admits to eye pressure, intermittent ear pain and mild soreness to the right side of her neck. Also admits to increased confusion ever since her episode in August. No fever, chills, back pain, vision changes. She had an angiogram last week at [facility] by her neurologist and follows up with him next week to find out the results.  The history is provided by the patient.    Past Medical History  Diagnosis Date  . Fibromyalgia   . Migraine   . Hypertension   . Depression   . Anxiety   . AVM (arteriovenous malformation)     Past Surgical History  Procedure Date  . Tubal ligation     No family history on file.  History  Substance Use Topics  . Smoking status: Former Games developer  . Smokeless tobacco: Not on file  . Alcohol Use: Yes     Comment: occasional    OB History    Grav Para Term Preterm Abortions TAB SAB Ect Mult Living                  Review of Systems  Constitutional: Negative for fever and chills.  HENT: Positive for ear pain and neck pain. Negative for neck stiffness.   Eyes: Positive for photophobia and pain. Negative for visual disturbance.  Respiratory: Negative for  shortness of breath.   Cardiovascular: Negative for chest pain.  Gastrointestinal: Positive for nausea. Negative for vomiting, diarrhea and constipation.  Genitourinary: Positive for frequency.  Musculoskeletal: Negative for back pain and gait problem.  Skin: Negative.   Neurological: Positive for headaches. Negative for seizures, syncope and weakness.  Psychiatric/Behavioral: Positive for confusion.    Allergies  Scallops  Home Medications   Current Outpatient Rx  Name  Route  Sig  Dispense  Refill  . CLONAZEPAM 0.5 MG PO TABS   Oral   Take 0.5 mg by mouth 2 (two) times daily as needed. For nerves.         Marland Kitchen ESOMEPRAZOLE MAGNESIUM 40 MG PO CPDR   Oral   Take 40 mg by mouth daily before breakfast. Dispense as written         . HYDROCODONE-ACETAMINOPHEN 5-325 MG PO TABS   Oral   Take 1 tablet by mouth every 6 (six) hours as needed. For pain.         Marland Kitchen HYDROCODONE-ACETAMINOPHEN 5-325 MG PO TABS   Oral   Take 1 tablet by mouth every 4 (four) hours as needed for pain.   15 tablet   0   . LEVETIRACETAM 500 MG PO TABS   Oral   Take 500 mg by mouth every 12 (twelve) hours.         Marland Kitchen  LISINOPRIL 20 MG PO TABS   Oral   Take 20 mg by mouth daily.           . VENLAFAXINE HCL ER 150 MG PO CP24   Oral   Take 150 mg by mouth daily.             BP 118/82  Pulse 119  Temp 98.6 F (37 C)  Resp 16  SpO2 98%  LMP 12/24/2011  Physical Exam  Nursing note and vitals reviewed. Constitutional: She is oriented to person, place, and time. She appears well-developed and well-nourished. No distress.       Sitting in the room with the lights off  HENT:  Head: Normocephalic and atraumatic.  Right Ear: Hearing, tympanic membrane, external ear and ear canal normal.  Left Ear: Hearing, tympanic membrane, external ear and ear canal normal.  Mouth/Throat: Uvula is midline and oropharynx is clear and moist.  Eyes: Conjunctivae normal are normal. Right eye exhibits nystagmus  (horizontal). Left eye exhibits nystagmus (horizontal).       Pupils 6 mm, round, equal and reactive to light.  Neck: Normal range of motion. Neck supple. No spinous process tenderness and no muscular tenderness present. No Brudzinski's sign and no Kernig's sign noted.  Cardiovascular: Normal rate, regular rhythm, normal heart sounds and intact distal pulses.        No extremity edema.  Pulmonary/Chest: Effort normal and breath sounds normal. No respiratory distress.  Abdominal: Soft. Bowel sounds are normal. There is no tenderness.  Musculoskeletal: Normal range of motion. She exhibits no edema.  Neurological: She is alert and oriented to person, place, and time. She has normal strength. No cranial nerve deficit or sensory deficit. She displays a negative Romberg sign. Coordination and gait normal. GCS eye subscore is 4. GCS verbal subscore is 5. GCS motor subscore is 6.  Skin: Skin is warm and dry. No rash noted.  Psychiatric: She has a normal mood and affect. Her speech is normal and behavior is normal. Thought content normal. Cognition and memory are normal.    ED Course  Procedures (including critical care time)   Labs Reviewed  CBC  BASIC METABOLIC PANEL  URINALYSIS, ROUTINE W REFLEX MICROSCOPIC   Ct Head Wo Contrast  01/28/2012  *RADIOLOGY REPORT*  Clinical Data: Headache and nausea  CT HEAD WITHOUT CONTRAST  Technique:  Contiguous axial images were obtained from the base of the skull through the vertex without contrast.  Comparison: Brain CT December 31, 2011; brain MRI December 10, 2011  Findings: The ventricles are normal in size and configuration.  The previously documented arteriovenous malformation in the right parietal region extending to the atrium right lateral ventricle superiorly appears stable on this noncontrast enhanced study. There is no surrounding edema in this area.  There is no mass, acute hemorrhage, extra-axial fluid collection, or midline shift. Elsewhere,  gray-white compartments are normal.  Bony calvarium appears intact.  The mastoid air cells are clear.  IMPRESSION: The arteriovenous malformation arising in the right parietal lobe extending into the atrium of the right lateral ventricle appears stable on this noncontrast enhanced study.  There is no acute hemorrhage, mass effect, or evidence suggesting acute infarct. Study elsewhere is within normal limits and stable.   Original Report Authenticated By: Bretta Bang, M.D.      1. Headache   2. AVM (arteriovenous malformation)       MDM  36 y/o female with history of AVM presenting with headache. This episode is similar  to 1 month ago where she was given pain control and followed up with her neurologist. Angiogram performed a couple weeks ago. Reviewed the results which did not show any changes. Obtaining CT scan to r/o active bleed. If no active bleed, will have her f/u with neurologist which she has scheduled appt next week to discuss at what point she should go to ED for her headaches. Case discussed with Dr. Radford Pax who agrees with plan of care.  6:45 PM CT scan without any changes. No active bleed. Patient aware to discuss headaches with neurologist next week. I will give her pain medication to manage her headache in the mean time. Vitals stable. Patient's headache has improved.      Trevor Mace, PA-C 01/28/12 1850

## 2012-01-28 NOTE — ED Notes (Signed)
Has had a h/a x 2 days dx w/ AVM and it ruptured in Aug 27 nqauseated also ears hurt hearing goes and comes in rt ear

## 2012-02-22 HISTORY — PX: CEREBRAL EMBOLIZATION: SHX1327

## 2012-02-25 DIAGNOSIS — M797 Fibromyalgia: Secondary | ICD-10-CM | POA: Insufficient documentation

## 2012-02-25 DIAGNOSIS — IMO0001 Reserved for inherently not codable concepts without codable children: Secondary | ICD-10-CM | POA: Insufficient documentation

## 2012-02-25 DIAGNOSIS — I10 Essential (primary) hypertension: Secondary | ICD-10-CM | POA: Insufficient documentation

## 2012-02-25 HISTORY — DX: Reserved for inherently not codable concepts without codable children: IMO0001

## 2012-03-03 DIAGNOSIS — Q2730 Arteriovenous malformation, site unspecified: Secondary | ICD-10-CM | POA: Insufficient documentation

## 2012-03-28 ENCOUNTER — Other Ambulatory Visit: Payer: Self-pay | Admitting: Radiation Oncology

## 2012-04-15 ENCOUNTER — Encounter (HOSPITAL_COMMUNITY): Payer: Self-pay | Admitting: Emergency Medicine

## 2012-04-15 ENCOUNTER — Emergency Department (INDEPENDENT_AMBULATORY_CARE_PROVIDER_SITE_OTHER)
Admission: EM | Admit: 2012-04-15 | Discharge: 2012-04-15 | Disposition: A | Discharge status: Home / Self Care | Payer: Self-pay | Source: Home / Self Care | Attending: Family Medicine | Admitting: Family Medicine

## 2012-04-15 DIAGNOSIS — Z760 Encounter for issue of repeat prescription: Secondary | ICD-10-CM

## 2012-04-15 DIAGNOSIS — G894 Chronic pain syndrome: Secondary | ICD-10-CM

## 2012-04-15 MED ORDER — OMEPRAZOLE 20 MG PO TBEC: DELAYED_RELEASE_TABLET | ORAL | Status: DC

## 2012-04-15 MED ORDER — OXYCODONE-ACETAMINOPHEN 5-325 MG PO TABS: 1.0000 | ORAL_TABLET | Freq: Three times a day (TID) | ORAL | Status: DC | PRN

## 2012-04-15 MED ORDER — PREGABALIN 75 MG PO CAPS: 75.0000 mg | ORAL_CAPSULE | Freq: Two times a day (BID) | ORAL | Status: DC

## 2012-04-15 MED ORDER — VENLAFAXINE HCL ER 150 MG PO CP24: 150.0000 mg | ORAL_CAPSULE | Freq: Every day | ORAL | Status: DC

## 2012-04-15 MED ORDER — CLONAZEPAM 0.5 MG PO TABS: 0.5000 mg | ORAL_TABLET | Freq: Two times a day (BID) | ORAL | Status: DC | PRN

## 2012-04-15 MED ORDER — LISINOPRIL 20 MG PO TABS: 20.0000 mg | ORAL_TABLET | Freq: Every day | ORAL | Status: DC

## 2012-04-15 NOTE — ED Notes (Signed)
Spoke to dr Alfonse Ras about patient complaint

## 2012-04-15 NOTE — ED Notes (Signed)
reports left shoulder pain for 2 weeks, unable to lift left arm above shoulder height without significant pain and "pulling" sensation in left upper arm.  Also has questions about med refill.  Out of nexium.  Reports palpitaions since 06.  Palpitations worse since august 13.  Sob with activity since august 13.

## 2012-04-15 NOTE — ED Provider Notes (Signed)
History     CSN: [id number]  Arrival date & time 04/15/12  1004   First MD Initiated Contact with Patient 04/15/12 1012      Chief Complaint  Patient presents with  . Shoulder Pain    (Consider location/radiation/quality/duration/timing/severity/associated sxs/prior treatment) HPI Comments: 37 year old female with history of fibromyalgia among other chronic comorbidities including brain AVM, hypertension and anxiety disorder. Here complaining of left shoulder pain. Patient reports she had this pain for years usually migrates from one side to another and also her pain sometimes affect her wrists. Pain has been in the left shoulder and left proximal arm for 2 weeks now. Patient ran out of pain medication she's had in the past. She currently does not have a primary care provider. Patient reports that lyrica has helped in the past with her fibromyalgia related joint and muscular type of pain.  Patient also concerned about rapid heartbeat. She states that she has seen a cardiologist in the past and has had normal thyroid function test that she's not taking any medications currently. Denies chest pain, syncope, dizziness or falls. Reports intermittent shortness of breath. Patient stated her palpitations have been associated with anxiety in the past. She is concerned she is about to ran out of her Effexor and also needs refills on Klonopin.   Past Medical History  Diagnosis Date  . Fibromyalgia   . Migraine   . Hypertension   . Depression   . Anxiety   . AVM (arteriovenous malformation)     Past Surgical History  Procedure Date  . Tubal ligation   . Cerebral embolization dec 2013    No family history on file.  History  Substance Use Topics  . Smoking status: Former Games developer  . Smokeless tobacco: Not on file  . Alcohol Use: No     Comment: occasional    OB History    Grav Para Term Preterm Abortions TAB SAB Ect Mult Living                  Review of Systems  Constitutional:  Negative for fever and chills.  HENT: Negative for congestion, sore throat and neck stiffness.   Respiratory: Negative for cough, chest tightness, shortness of breath and wheezing.   Cardiovascular: Positive for palpitations. Negative for chest pain and leg swelling.  Gastrointestinal: Negative for nausea, vomiting and abdominal pain.  Musculoskeletal:       As per HPI  Skin: Negative for rash.  Neurological: Negative for dizziness and headaches.  Psychiatric/Behavioral: The patient is nervous/anxious.     Allergies  Scallops  Home Medications   Current Outpatient Rx  Name  Route  Sig  Dispense  Refill  . CLONAZEPAM 0.5 MG PO TABS   Oral   Take 1 tablet (0.5 mg total) by mouth 2 (two) times daily as needed. For anxiety   30 tablet   0   . LEVETIRACETAM 500 MG PO TABS   Oral   Take 500 mg by mouth every 12 (twelve) hours.         Marland Kitchen LISINOPRIL 20 MG PO TABS   Oral   Take 1 tablet (20 mg total) by mouth daily.   30 tablet   0   . OMEPRAZOLE 20 MG PO TBEC      1-2 tabs daily as needed   30 each   0   . OXYCODONE-ACETAMINOPHEN 5-325 MG PO TABS   Oral   Take 1-2 tablets by mouth every 8 (eight) hours as  needed for pain.   15 tablet   0   . PREGABALIN 75 MG PO CAPS   Oral   Take 1 capsule (75 mg total) by mouth 2 (two) times daily.   60 capsule   0   . VENLAFAXINE HCL ER 150 MG PO CP24   Oral   Take 1 capsule (150 mg total) by mouth daily.   30 capsule   0     BP 123/71  Pulse 114  Temp 99.3 F (37.4 C) (Oral)  Resp 12  SpO2 99%  LMP 04/15/2012  Physical Exam  Nursing note and vitals reviewed. Constitutional: She is oriented to person, place, and time. She appears well-developed and well-nourished. No distress.  HENT:  Head: Normocephalic and atraumatic.  Right Ear: External ear normal.  Left Ear: External ear normal.  Mouth/Throat: Oropharynx is clear and moist. No oropharyngeal exudate.  Neck: Neck supple. No JVD present. No thyromegaly  present.  Cardiovascular: Regular rhythm and normal heart sounds.        Mild tachycardia  Pulmonary/Chest: Effort normal and breath sounds normal. No respiratory distress. She has no wheezes. She has no rales. She exhibits no tenderness.  Musculoskeletal:       Left shoulder: No obvious deformity, no erythema, swelling, bruising or increased temperature. Diffused tenderness anterior. Limited range of motion due to pain but no signs of dislocation. Patient able to abduct left arm at shoulder joint past 90 degrees with reported moderate to severe pain. No change with empty can test. Reported difusse pain with palpation in left lateral proximal arm and dorsum of left wrist. Entire left upper extremity appears neurovascularly intact.  Lymphadenopathy:    She has no cervical adenopathy.  Neurological: She is alert and oriented to person, place, and time.  Skin: No rash noted. She is not diaphoretic.  Psychiatric: She has a normal mood and affect. Her behavior is normal. Judgment and thought content normal.    ED Course  Procedures (including critical care time)  Labs Reviewed - No data to display No results found.   1. Chronic pain disorder   2. Medication refill    EKG with sinus rhythm and ventricular rate of 102 beats per minutes inverted T wave in V1 otherwise no acute ischemic changes.   MDM  Heart rate in the low 100's range normal blood pressure. Sinus tachycardia on EKG. Refilled medications including generic form of Lyrica. Gave prescription for Percocet #15 pills and no refills. Also Klonopin 0.5 mg #30 tablets and no refills Patient was asked to followup with the [facility] to establish primary care and contact information was provided.        Sharin Grave, MD 04/17/12 [phone]

## 2012-05-14 ENCOUNTER — Emergency Department (HOSPITAL_COMMUNITY)
Admission: EM | Admit: 2012-05-14 | Discharge: 2012-05-14 | Disposition: A | Discharge status: Home / Self Care | Payer: No Typology Code available for payment source | Attending: Emergency Medicine | Admitting: Emergency Medicine

## 2012-05-14 ENCOUNTER — Emergency Department (HOSPITAL_COMMUNITY)
Admission: EM | Admit: 2012-05-14 | Discharge: 2012-05-14 | Disposition: A | Discharge status: Nursing Home (Includes ICF) | Payer: No Typology Code available for payment source | Source: Home / Self Care | Attending: Family Medicine | Admitting: Family Medicine

## 2012-05-14 ENCOUNTER — Emergency Department (HOSPITAL_COMMUNITY): Payer: No Typology Code available for payment source

## 2012-05-14 ENCOUNTER — Encounter (HOSPITAL_COMMUNITY): Payer: Self-pay

## 2012-05-14 ENCOUNTER — Encounter (HOSPITAL_COMMUNITY): Payer: Self-pay | Admitting: Cardiology

## 2012-05-14 DIAGNOSIS — F329 Major depressive disorder, single episode, unspecified: Secondary | ICD-10-CM | POA: Insufficient documentation

## 2012-05-14 DIAGNOSIS — F411 Generalized anxiety disorder: Secondary | ICD-10-CM | POA: Insufficient documentation

## 2012-05-14 DIAGNOSIS — M2550 Pain in unspecified joint: Secondary | ICD-10-CM

## 2012-05-14 DIAGNOSIS — M25512 Pain in left shoulder: Secondary | ICD-10-CM

## 2012-05-14 DIAGNOSIS — R51 Headache: Secondary | ICD-10-CM | POA: Insufficient documentation

## 2012-05-14 DIAGNOSIS — Z87891 Personal history of nicotine dependence: Secondary | ICD-10-CM | POA: Insufficient documentation

## 2012-05-14 DIAGNOSIS — F3289 Other specified depressive episodes: Secondary | ICD-10-CM | POA: Insufficient documentation

## 2012-05-14 DIAGNOSIS — I10 Essential (primary) hypertension: Secondary | ICD-10-CM | POA: Insufficient documentation

## 2012-05-14 DIAGNOSIS — R1310 Dysphagia, unspecified: Secondary | ICD-10-CM | POA: Insufficient documentation

## 2012-05-14 DIAGNOSIS — R4789 Other speech disturbances: Secondary | ICD-10-CM

## 2012-05-14 DIAGNOSIS — M25519 Pain in unspecified shoulder: Secondary | ICD-10-CM | POA: Insufficient documentation

## 2012-05-14 DIAGNOSIS — G8929 Other chronic pain: Secondary | ICD-10-CM | POA: Insufficient documentation

## 2012-05-14 DIAGNOSIS — H53149 Visual discomfort, unspecified: Secondary | ICD-10-CM | POA: Insufficient documentation

## 2012-05-14 DIAGNOSIS — Z79899 Other long term (current) drug therapy: Secondary | ICD-10-CM | POA: Insufficient documentation

## 2012-05-14 DIAGNOSIS — R110 Nausea: Secondary | ICD-10-CM | POA: Insufficient documentation

## 2012-05-14 DIAGNOSIS — Z8679 Personal history of other diseases of the circulatory system: Secondary | ICD-10-CM | POA: Insufficient documentation

## 2012-05-14 LAB — CBC WITH DIFFERENTIAL/PLATELET
Basophils Absolute: 0 10*3/uL (ref 0.0–0.1)
Eosinophils Absolute: 0.1 10*3/uL (ref 0.0–0.7)
Eosinophils Relative: 1 % (ref 0–5)
Lymphocytes Relative: 33 % (ref 12–46)
MCH: 28.9 pg (ref 26.0–34.0)
MCV: 85.4 fL (ref 78.0–100.0)
Neutrophils Relative %: 59 % (ref 43–77)
Platelets: 209 10*3/uL (ref 150–400)
RDW: 13.6 % (ref 11.5–15.5)
WBC: 6.9 10*3/uL (ref 4.0–10.5)

## 2012-05-14 LAB — BASIC METABOLIC PANEL
Calcium: 9 mg/dL (ref 8.4–10.5)
GFR calc non Af Amer: >90 mL/min (ref >90)
Potassium: 3.9 mEq/L (ref 3.5–5.1)
Sodium: 139 mEq/L (ref 135–145)

## 2012-05-14 MED ORDER — OXYCODONE-ACETAMINOPHEN 5-325 MG PO TABS
1.0000 | ORAL_TABLET | Freq: Once | ORAL | Status: AC
  Administered 2012-05-14: 1 via ORAL
  Filled 2012-05-14: qty 1

## 2012-05-14 MED ORDER — VENLAFAXINE HCL ER 150 MG PO CP24: 150.0000 mg | ORAL_CAPSULE | Freq: Every day | ORAL | Status: DC

## 2012-05-14 MED ORDER — PREGABALIN 75 MG PO CAPS: 75.0000 mg | ORAL_CAPSULE | Freq: Two times a day (BID) | ORAL | Status: DC

## 2012-05-14 MED ORDER — OXYCODONE-ACETAMINOPHEN 5-325 MG PO TABS: 1.0000 | ORAL_TABLET | Freq: Four times a day (QID) | ORAL | Status: DC | PRN

## 2012-05-14 MED ORDER — LISINOPRIL 20 MG PO TABS: 20.0000 mg | ORAL_TABLET | Freq: Every day | ORAL | Status: DC

## 2012-05-14 NOTE — ED Notes (Signed)
Rx x 1.  Pt voiced understanding to f/u with PCP and return for worsening condition.  

## 2012-05-14 NOTE — ED Notes (Signed)
Onalee Hua, NP at bedside to speak with pt

## 2012-05-14 NOTE — ED Notes (Signed)
Complains of headache, and left shoulder pain AVM rupture with an anurism behind it-had a imobilization in Dec Has been feeling off the past couple of days-trouble remembering Shock like feeling in head

## 2012-05-14 NOTE — ED Provider Notes (Signed)
History     CSN: [id number]  Arrival date & time 05/14/12  1602   First MD Initiated Contact with Patient 05/14/12 1742      Chief Complaint  Patient presents with  . Headache  . Shoulder Pain    (Consider location/radiation/quality/duration/timing/severity/associated sxs/prior treatment) Patient is a 37 y.o. female presenting with headaches and shoulder pain. The history is provided by the patient. No language interpreter was used.  Headache Pain location:  Frontal Radiates to: back of head bilaterally. Onset quality:  Sudden Duration:  3 days Timing:  Intermittent Progression:  Waxing and waning Chronicity:  New Similar to prior headaches: yes   Context: bright light   Relieved by:  None tried Associated symptoms: nausea and photophobia   Associated symptoms comment:  Speech change (stuttering) Shoulder Pain Associated symptoms include arthralgias, headaches and nausea.    Past Medical History  Diagnosis Date  . Fibromyalgia   . Migraine   . Hypertension   . Depression   . Anxiety   . AVM (arteriovenous malformation)     Past Surgical History  Procedure Laterality Date  . Tubal ligation    . Cerebral embolization  dec 2013    History reviewed. No pertinent family history.  History  Substance Use Topics  . Smoking status: Former Games developer  . Smokeless tobacco: Not on file  . Alcohol Use: No     Comment: occasional    OB History   Grav Para Term Preterm Abortions TAB SAB Ect Mult Living                  Review of Systems  Eyes: Positive for photophobia.  Gastrointestinal: Positive for nausea.  Musculoskeletal: Positive for arthralgias.  Neurological: Positive for speech difficulty and headaches.  All other systems reviewed and are negative.    Allergies  Scallops  Home Medications   Current Outpatient Rx  Name  Route  Sig  Dispense  Refill  . clonazePAM (KLONOPIN) 0.5 MG tablet   Oral   Take 1 tablet (0.5 mg total) by mouth 2 (two)  times daily as needed. For anxiety   30 tablet   0   . levETIRAcetam (KEPPRA) 500 MG tablet   Oral   Take 500 mg by mouth every 12 (twelve) hours.         Marland Kitchen lisinopril (PRINIVIL,ZESTRIL) 20 MG tablet   Oral   Take 1 tablet (20 mg total) by mouth daily.   30 tablet   0   . Omeprazole 20 MG TBEC   Oral   Take 1 tablet by mouth daily as needed. 1-2 tabs daily as needed acid reflux         . oxyCODONE-acetaminophen (PERCOCET) 5-325 MG per tablet   Oral   Take 1-2 tablets by mouth every 8 (eight) hours as needed for pain.   15 tablet   0   . pregabalin (LYRICA) 75 MG capsule   Oral   Take 1 capsule (75 mg total) by mouth 2 (two) times daily.   60 capsule   0   . venlafaxine XR (EFFEXOR-XR) 150 MG 24 hr capsule   Oral   Take 1 capsule (150 mg total) by mouth daily.   30 capsule   0     BP 119/77  Pulse 103  Temp(Src) 98.3 F (36.8 C) (Oral)  Resp 18  SpO2 98%  LMP 04/15/2012  Physical Exam  Nursing note and vitals reviewed. Constitutional: She is oriented to person, place, and  time. She appears well-developed and well-nourished.  HENT:  Head: Normocephalic and atraumatic.  Neck: Normal range of motion. Neck supple.  Cardiovascular: Normal rate, regular rhythm and normal heart sounds.   Pulmonary/Chest: Effort normal and breath sounds normal.  Abdominal: Soft. Bowel sounds are normal.  Musculoskeletal: Normal range of motion.  Lymphadenopathy:    She has no cervical adenopathy.  Neurological: She is alert and oriented to person, place, and time.  Skin: Skin is warm and dry.  Psychiatric: She has a normal mood and affect. Her behavior is normal. Judgment and thought content normal.    ED Course  Procedures (including critical care time)  Labs Reviewed  CBC WITH DIFFERENTIAL  BASIC METABOLIC PANEL   No results found.   No diagnosis found.  Patient with known AVM, new onset headache 3 days ago.  Mild photophobia and nausea.  Pupils dilated,  sluggish to react.  Chronic left shoulder pain--history of injury one year ago, increasing difficulty moving (?frozen shoulder).    8:35 PM  CT and lab results reviewed, discussed with Dr. Ignacia Palma, shared with patient.  No indication of acute intracranial process.  Will have patient follow-up with her PCP.  Referral to ortho for chronic shoulder pain and initiation of PT.  MDM          Jimmye Norman, NP 05/15/12 [phone]

## 2012-05-14 NOTE — ED Provider Notes (Signed)
History   CSN: [id number]  Arrival date & time 05/14/12  1503   First MD Initiated Contact with Patient 05/14/12 1523     Chief Complaint  Patient presents with  . Headache  . Shoulder Pain   HPI  Pt is reporting that she is having a severe headache pain 10/10 present for the last 3 days.  She's also been having nausea and emesis and reports that she is having stuttering speech.  She says that she fell down 3 days ago and fell on her knees on her neighbors' driveway scraping her hands.  PT says that light doesn't bother her headache that much.  She is also having some pain in her left shoulder.  She says that she is having worsening anxiety and reports that her left shoulder and neck has been hurting.  Pt says that she has had seizures when she was first diagnosed with AVM.  She says that she was prescribed keppra by her neurosurgeon when she started studdering her words.  She says that she is taking the keppra regularly.  Pt says that she is having nausea for the past 3 days.  She says that she is also reporting that this headache has been significant for her and causing significant worry.  She reports no weakness in the extremities.  She reports no significant visual changes.  She reports that she has a throbbing headache.     Past Medical History  Diagnosis Date  . Fibromyalgia   . Migraine   . Hypertension   . Depression   . Anxiety   . AVM (arteriovenous malformation)     Past Surgical History  Procedure Laterality Date  . Tubal ligation    . Cerebral embolization  dec 2013    History  Substance Use Topics  . Smoking status: Former Games developer  . Smokeless tobacco: Not on file  . Alcohol Use: No     Comment: occasional    OB History   Grav Para Term Preterm Abortions TAB SAB Ect Mult Living                 Review of Systems  Constitutional: Positive for fatigue.  HENT: Positive for congestion.   Gastrointestinal: Positive for nausea and vomiting.  Neurological: Positive  for dizziness, speech difficulty, light-headedness and headaches.  All other systems reviewed and are negative.   Allergies  Scallops  Home Medications   Current Outpatient Rx  Name  Route  Sig  Dispense  Refill  . clonazePAM (KLONOPIN) 0.5 MG tablet   Oral   Take 1 tablet (0.5 mg total) by mouth 2 (two) times daily as needed. For anxiety   30 tablet   0   . levETIRAcetam (KEPPRA) 500 MG tablet   Oral   Take 500 mg by mouth every 12 (twelve) hours.         Marland Kitchen lisinopril (PRINIVIL,ZESTRIL) 20 MG tablet   Oral   Take 1 tablet (20 mg total) by mouth daily.   30 tablet   0   . Omeprazole 20 MG TBEC      1-2 tabs daily as needed   30 each   0   . oxyCODONE-acetaminophen (PERCOCET) 5-325 MG per tablet   Oral   Take 1-2 tablets by mouth every 8 (eight) hours as needed for pain.   15 tablet   0   . pregabalin (LYRICA) 75 MG capsule   Oral   Take 1 capsule (75 mg total) by mouth 2 (  two) times daily.   60 capsule   0   . venlafaxine XR (EFFEXOR-XR) 150 MG 24 hr capsule   Oral   Take 1 capsule (150 mg total) by mouth daily.   30 capsule   0     BP 108/73  Pulse 81  Temp(Src) 98.4 F (36.9 C) (Oral)  SpO2 100%  LMP 04/15/2012  Physical Exam  Nursing note and vitals reviewed. Constitutional: She is oriented to person, place, and time. She appears well-developed and well-nourished. No distress.  HENT:  Head: Normocephalic and atraumatic.  Nose: Mucosal edema present.  Eyes: Conjunctivae and EOM are normal. Pupils are equal, round, and reactive to light.  Neck: Normal range of motion. Neck supple. No JVD present. No tracheal deviation present. No thyromegaly present.  Cardiovascular: Normal rate, regular rhythm, normal heart sounds and intact distal pulses.   No murmur heard. Pulmonary/Chest: Effort normal and breath sounds normal. No respiratory distress. She has no wheezes. She has no rales. She exhibits no tenderness.  Abdominal: Soft. Bowel sounds are  normal. She exhibits no distension and no mass. There is no tenderness. There is no rebound and no guarding.  Musculoskeletal: She exhibits no edema and no tenderness.       Left shoulder: She exhibits decreased range of motion, tenderness, bony tenderness, pain, spasm and decreased strength. She exhibits no swelling, no effusion, no crepitus, no deformity, no laceration and normal pulse.       Arms: Lymphadenopathy:    She has no cervical adenopathy.  Neurological: She is alert and oriented to person, place, and time. No cranial nerve deficit.  Strength 4/5 left upper extremity   Skin: Skin is warm and dry. No rash noted. No erythema. No pallor.  Psychiatric: She has a normal mood and affect. Her behavior is normal. Judgment and thought content normal.    ED Course  Procedures (including critical care time)  Labs Reviewed - No data to display No results found.  No diagnosis found.  MDM  IMPRESSION  AV Malformation  Severe headache x3 days  Neurological Changes-acute  Acute speech changes  Nausea and vomiting  Anxiety disorder   History of seizures  Left shoulder pain/frozen left shoulder  RECOMMENDATIONS / PLAN  Pt was sent to ER for further evaluation and management. I really concerned about the significant headache that she's having for the past 3 days associated with a recent fall and now presenting with nausea and vomiting.  FOLLOW UP 2 weeks  The patient was given clear instructions to go to ER or return to medical center if symptoms don't improve, worsen or new problems develop.  The patient verbalized understanding.  The patient was told to call to get lab results if they haven't heard anything in the next week.            Cleora Fleet, MD 05/14/12 1944

## 2012-05-14 NOTE — ED Notes (Signed)
Pt reports headache for that past 3 days and stuttering. States she has has a AVM rupture this past December. States she has a shock sensation that goes through her head. Denies any vision changes but reports occasional black dots. No neuro deficits noted. Also having left shoulder pain.

## 2012-05-15 NOTE — ED Provider Notes (Signed)
Medical screening examination/treatment/procedure(s) were performed by non-physician practitioner and as supervising physician I was immediately available for consultation/collaboration.   Carleene Cooper III, MD 05/15/12 [phone]

## 2012-05-17 NOTE — ED Notes (Signed)
Referral to pain management

## 2012-05-17 NOTE — ED Notes (Signed)
Dr referrred to [facility]t 05/25/12

## 2012-05-25 ENCOUNTER — Ambulatory Visit: Payer: No Typology Code available for payment source | Admitting: Family Medicine

## 2012-06-28 NOTE — ED Notes (Signed)
Referral for pain clinic denied patient not eligible at this time

## 2012-07-23 ENCOUNTER — Other Ambulatory Visit (HOSPITAL_COMMUNITY): Payer: Self-pay | Admitting: Interventional Radiology

## 2012-07-23 DIAGNOSIS — Q2730 Arteriovenous malformation, site unspecified: Secondary | ICD-10-CM

## 2012-07-26 ENCOUNTER — Other Ambulatory Visit: Payer: Self-pay | Admitting: Radiology

## 2012-07-26 ENCOUNTER — Ambulatory Visit (HOSPITAL_COMMUNITY): Payer: No Typology Code available for payment source

## 2012-07-27 ENCOUNTER — Ambulatory Visit (HOSPITAL_COMMUNITY)
Admission: RE | Admit: 2012-07-27 | Discharge: 2012-07-27 | Disposition: A | Discharge status: Home / Self Care | Payer: No Typology Code available for payment source | Source: Ambulatory Visit | Attending: Interventional Radiology | Admitting: Interventional Radiology

## 2012-07-27 ENCOUNTER — Encounter (HOSPITAL_COMMUNITY): Payer: Self-pay

## 2012-07-27 ENCOUNTER — Other Ambulatory Visit (HOSPITAL_COMMUNITY): Payer: Self-pay | Admitting: Interventional Radiology

## 2012-07-27 DIAGNOSIS — F411 Generalized anxiety disorder: Secondary | ICD-10-CM | POA: Insufficient documentation

## 2012-07-27 DIAGNOSIS — IMO0001 Reserved for inherently not codable concepts without codable children: Secondary | ICD-10-CM | POA: Insufficient documentation

## 2012-07-27 DIAGNOSIS — F329 Major depressive disorder, single episode, unspecified: Secondary | ICD-10-CM | POA: Insufficient documentation

## 2012-07-27 DIAGNOSIS — Z91013 Allergy to seafood: Secondary | ICD-10-CM | POA: Insufficient documentation

## 2012-07-27 DIAGNOSIS — Z87891 Personal history of nicotine dependence: Secondary | ICD-10-CM | POA: Insufficient documentation

## 2012-07-27 DIAGNOSIS — F3289 Other specified depressive episodes: Secondary | ICD-10-CM | POA: Insufficient documentation

## 2012-07-27 DIAGNOSIS — Q2730 Arteriovenous malformation, site unspecified: Secondary | ICD-10-CM

## 2012-07-27 DIAGNOSIS — Q283 Other malformations of cerebral vessels: Secondary | ICD-10-CM | POA: Insufficient documentation

## 2012-07-27 DIAGNOSIS — G43909 Migraine, unspecified, not intractable, without status migrainosus: Secondary | ICD-10-CM | POA: Insufficient documentation

## 2012-07-27 DIAGNOSIS — I10 Essential (primary) hypertension: Secondary | ICD-10-CM | POA: Insufficient documentation

## 2012-07-27 LAB — CBC WITH DIFFERENTIAL/PLATELET
Eosinophils Absolute: 0.1 10*3/uL (ref 0.0–0.7)
Lymphocytes Relative: 35 % (ref 12–46)
Lymphs Abs: 1.8 10*3/uL (ref 0.7–4.0)
Neutro Abs: 2.9 10*3/uL (ref 1.7–7.7)
Neutrophils Relative %: 56 % (ref 43–77)
Platelets: 200 10*3/uL (ref 150–400)
RBC: 4.3 MIL/uL (ref 3.87–5.11)
WBC: 5.1 10*3/uL (ref 4.0–10.5)

## 2012-07-27 LAB — PROTIME-INR
INR: 1.05 (ref 0.00–1.49)
Prothrombin Time: 13.6 seconds (ref 11.6–15.2)

## 2012-07-27 LAB — BASIC METABOLIC PANEL
Calcium: 9.1 mg/dL (ref 8.4–10.5)
GFR calc Af Amer: >90 mL/min (ref >90)
GFR calc non Af Amer: >90 mL/min (ref >90)
Sodium: 138 mEq/L (ref 135–145)

## 2012-07-27 LAB — APTT: aPTT: 34 seconds (ref 24–37)

## 2012-07-27 MED ORDER — OXYCODONE-ACETAMINOPHEN 5-325 MG PO TABS
1.0000 | ORAL_TABLET | ORAL | Status: AC
  Administered 2012-07-27: 1 via ORAL

## 2012-07-27 MED ORDER — SODIUM CHLORIDE 0.9 % IV SOLN: INTRAVENOUS | Status: DC

## 2012-07-27 MED ORDER — SODIUM CHLORIDE 0.9 % IV SOLN
INTRAVENOUS | Status: AC | PRN
  Administered 2012-07-27: 75 mL/h via INTRAVENOUS

## 2012-07-27 MED ORDER — OXYCODONE-ACETAMINOPHEN 5-325 MG PO TABS
ORAL_TABLET | ORAL | Status: AC
  Filled 2012-07-27: qty 1

## 2012-07-27 MED ORDER — OXYCODONE-ACETAMINOPHEN 5-325 MG PO TABS
ORAL_TABLET | ORAL | Status: AC
  Administered 2012-07-27: 1 via ORAL
  Filled 2012-07-27: qty 1

## 2012-07-27 MED ORDER — IOHEXOL 300 MG/ML  SOLN
150.0000 mL | Freq: Once | INTRAMUSCULAR | Status: AC | PRN
  Administered 2012-07-27: 80 mL via INTRA_ARTERIAL

## 2012-07-27 MED ORDER — FENTANYL CITRATE 0.05 MG/ML IJ SOLN
INTRAMUSCULAR | Status: AC | PRN
  Administered 2012-07-27: 12.5 ug via INTRAVENOUS
  Administered 2012-07-27: 25 ug via INTRAVENOUS

## 2012-07-27 MED ORDER — MIDAZOLAM HCL 2 MG/2ML IJ SOLN
INTRAMUSCULAR | Status: AC | PRN
  Administered 2012-07-27: 1 mg via INTRAVENOUS
  Administered 2012-07-27: 0.5 mg via INTRAVENOUS

## 2012-07-27 MED ORDER — MIDAZOLAM HCL 2 MG/2ML IJ SOLN
INTRAMUSCULAR | Status: AC
  Filled 2012-07-27: qty 4

## 2012-07-27 MED ORDER — SODIUM CHLORIDE 0.9 % IV SOLN: INTRAVENOUS | Status: AC

## 2012-07-27 MED ORDER — FENTANYL CITRATE 0.05 MG/ML IJ SOLN
INTRAMUSCULAR | Status: AC
  Filled 2012-07-27: qty 2

## 2012-07-27 NOTE — Procedures (Signed)
S/P 4 vessel cerebral arteriogram . RT CFA approach. Findings. 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 Art feeders from distal  RT pericallosal branches,post rt  MCA branches and fistulous communication with a rt PCA P3 branch.Marland Kitchen

## 2012-07-27 NOTE — H&P (Signed)
Katie [name] is an 37 y.o. female.   Chief Complaint: Evaluate arteriovenous malformation Post coiling performed at [name] 02/2102 per Katie [name] Had been seen by Katie [name] 11/20/11 for arteriogram which revealed AVM; developed hemorrhage Referred to Katie [name] for treatment  Now has asked to be referred to MD in [name] for follow Scheduled now for cerebral arteriogram  Continues to have daily headaches; occasional lasting few days Last sz was March 2014 HPI: fibromyalgia; Migraines; HTN; AVM  Past Medical History  Diagnosis Date  . Fibromyalgia   . Migraine   . Hypertension   . Depression   . Anxiety   . AVM (arteriovenous malformation)     Past Surgical History  Procedure Laterality Date  . Tubal ligation    . Cerebral embolization  dec 2013    No family history on file. Social History:  reports that she has quit smoking. She does not have any smokeless tobacco history on file. She reports that she does not drink alcohol or use illicit drugs.  Allergies:  Allergies  Allergen Reactions  . Scallops (Shellfish Allergy) Hives and Nausea And Vomiting     (Not in [name] admission)  No results found for this or any previous visit (from the past 48 hour(s)). No results found.  Review of Systems  Constitutional: Negative for fever.  HENT: Positive for neck pain.   Respiratory: Negative for shortness of breath.   Cardiovascular: Negative for chest pain.  Gastrointestinal: Negative for nausea, vomiting and abdominal pain.  Neurological: Positive for seizures and headaches. Negative for weakness.       Most recent sz: late March 2014  Psychiatric/Behavioral: Negative for memory loss.    Blood pressure 110/76, pulse 72, temperature 97.9 F (36.6 C), temperature source Oral, resp. rate 20, height 5\' 1"  (1.549 m), weight 172 lb (78.019 kg), last menstrual period 07/27/2012, SpO2 99.00%. Physical Exam  Constitutional: She is oriented to person, place, and time. She  appears well-developed and well-nourished.  Cardiovascular: Normal rate and regular rhythm.   No murmur heard. Respiratory: Effort normal and breath sounds normal. She has no wheezes.  GI: Soft. Bowel sounds are normal. There is no tenderness.  Neurological: She is alert and oriented to person, place, and time. No cranial nerve deficit. Coordination normal.  Skin: Skin is warm and dry.  Psychiatric: She has [name] normal mood and affect. Judgment and thought content normal.     Assessment/Plan Known AVM Coiled at [facility] 02/2012 Returns now for follow up and evaluation Continued headaches daily occas migraine Scheduled for cerebral arteriogram Pt aware of procedure benefits and risks and agreeable to proceed Consent signed and in chart  Pakou Rainbow [name] 07/27/2012, 8:53 AM

## 2012-09-01 ENCOUNTER — Telehealth: Payer: Self-pay | Admitting: *Deleted

## 2012-09-01 ENCOUNTER — Encounter: Payer: Self-pay | Admitting: Internal Medicine

## 2012-09-01 ENCOUNTER — Ambulatory Visit: Payer: No Typology Code available for payment source | Attending: Family Medicine | Admitting: Internal Medicine

## 2012-09-01 VITALS — BP 127/89 | HR 77 | Temp 98.5°F | Resp 16 | Ht 62.0 in | Wt 175.0 lb

## 2012-09-01 DIAGNOSIS — G40909 Epilepsy, unspecified, not intractable, without status epilepticus: Secondary | ICD-10-CM | POA: Insufficient documentation

## 2012-09-01 DIAGNOSIS — F329 Major depressive disorder, single episode, unspecified: Secondary | ICD-10-CM

## 2012-09-01 DIAGNOSIS — I10 Essential (primary) hypertension: Secondary | ICD-10-CM | POA: Insufficient documentation

## 2012-09-01 DIAGNOSIS — F411 Generalized anxiety disorder: Secondary | ICD-10-CM

## 2012-09-01 DIAGNOSIS — F341 Dysthymic disorder: Secondary | ICD-10-CM | POA: Insufficient documentation

## 2012-09-01 DIAGNOSIS — K219 Gastro-esophageal reflux disease without esophagitis: Secondary | ICD-10-CM | POA: Insufficient documentation

## 2012-09-01 MED ORDER — CLONAZEPAM 0.5 MG PO TABS: 0.5000 mg | ORAL_TABLET | Freq: Two times a day (BID) | ORAL | Status: DC | PRN

## 2012-09-01 MED ORDER — LEVETIRACETAM 500 MG PO TABS: 500.0000 mg | ORAL_TABLET | Freq: Two times a day (BID) | ORAL | Status: DC

## 2012-09-01 MED ORDER — OMEPRAZOLE 20 MG PO TBEC: 1.0000 | DELAYED_RELEASE_TABLET | Freq: Every day | ORAL | Status: DC | PRN

## 2012-09-01 MED ORDER — LISINOPRIL 20 MG PO TABS: 20.0000 mg | ORAL_TABLET | Freq: Every day | ORAL | Status: DC

## 2012-09-01 NOTE — Telephone Encounter (Signed)
09/01/12 Patient walked into clinic requesting refill on medication.Patient  Was last seen in February will schedule appointment for today. P.Duran Ohern,RN BSN MHA

## 2012-09-01 NOTE — Progress Notes (Signed)
Patient ID: Katie [name], female   DOB: Apr 07, 1975, 37 y.o.   MRN: [id number] Patient Demographics  Katie [name], is a 37 y.o. female  WUJ:[id number]  [id number]  DOB - 1976-03-09  Chief Complaint  Patient presents with  . Follow-up    BP check review meds.        Subjective:   Katie [name] today is here for a follow up visit. Vision has a history of brain AVM with SAH last year, hypertension, depression and anxiety. She currently has no major complaints except for medication refills. She currently is doing well, she unfortunately ran out of her lisinopril and has not taken for the past week or so. She does not have a headache currently. She has a history of brain AVM with SAH in August 2013 requiring coiling at [facility]. Her Primary neurosurgeon is Dr. Mikal Plane, she claims he is planning to refer her to a neurosurgeon in [place]. - She recently had a cerebral arteriogram done by Dr. Terald Sleeper.  She does claim to have some mild intermittent epigastric pain without any nausea vomiting or diarrhea  Patient has No headache, No chest pain,No Nausea, No new weakness tingling or numbness, No Cough - SOB.   Objective:    Filed Vitals:   09/01/12 1021  BP: 127/89  Pulse: 77  Temp: 98.5 F (36.9 C)  TempSrc: Oral  Resp: 16  Height: 5\' 2"  (1.575 m)  Weight: 175 lb (79.379 kg)  SpO2: 97%     ALLERGIES:   Allergies  Allergen Reactions  . Scallops (Shellfish Allergy) Hives and Nausea And Vomiting    PAST MEDICAL HISTORY: Past Medical History  Diagnosis Date  . Fibromyalgia   . Migraine   . Hypertension   . Depression   . Anxiety   . AVM (arteriovenous malformation)     MEDICATIONS AT HOME: Prior to Admission medications   Medication Sig Start Date End Date Taking? Authorizing Provider  levETIRAcetam (KEPPRA) 500 MG tablet Take 1 tablet (500 mg total) by mouth every 12 (twelve) hours. 09/01/12  Yes Shanker Levora Dredge, MD  lisinopril (PRINIVIL,ZESTRIL) 20 MG tablet  Take 1 tablet (20 mg total) by mouth daily. 09/01/12  Yes Shanker Levora Dredge, MD  clonazePAM (KLONOPIN) 0.5 MG tablet Take 1 tablet (0.5 mg total) by mouth 2 (two) times daily as needed for anxiety. For anxiety 09/01/12   Maretta Bees, MD  Omeprazole 20 MG TBEC Take 1 tablet (20 mg total) by mouth daily as needed. 1-2 tabs daily as needed acid reflux 09/01/12   Maretta Bees, MD  oxyCODONE-acetaminophen (PERCOCET/ROXICET) 5-325 MG per tablet Take 1 tablet by mouth every 6 (six) hours as needed for pain. 05/14/12   Jimmye Norman, NP     Exam  General appearance :Awake, alert, not in any distress. Speech Clear. Not toxic Looking HEENT: Atraumatic and Normocephalic, pupils equally reactive to light and accomodation Neck: supple, no JVD. No cervical lymphadenopathy.  Chest:Good air entry bilaterally, no added sounds  CVS: S1 S2 regular, no murmurs.  Abdomen: Bowel sounds present, Non tender and not distended with no gaurding, rigidity or rebound. Extremities: B/L Lower Ext shows no edema, both legs are warm to touch Neurology: Awake alert, and oriented X 3, CN II-XII intact, Non focal Skin:No Rash Wounds:N/A    Data Review   CBC No results found for this basename: WBC, HGB, HCT, PLT, MCV, MCH, MCHC, RDW, NEUTRABS, LYMPHSABS, MONOABS, EOSABS, BASOSABS, BANDABS, BANDSABD,  in the last 168 hours  Chemistries  No results found for this basename: NA, K, CL, CO2, GLUCOSE, BUN, CREATININE, GFRCGP, CALCIUM, MG, AST, ALT, ALKPHOS, BILITOT,  in the last 168 hours ------------------------------------------------------------------------------------------------------------------ No results found for this basename: HGBA1C,  in the last 72 hours ------------------------------------------------------------------------------------------------------------------ No results found for this basename: CHOL, HDL, LDLCALC, TRIG, CHOLHDL, LDLDIRECT,  in the last 72  hours ------------------------------------------------------------------------------------------------------------------ No results found for this basename: TSH, T4TOTAL, FREET3, T3FREE, THYROIDAB,  in the last 72 hours ------------------------------------------------------------------------------------------------------------------ No results found for this basename: VITAMINB12, FOLATE, FERRITIN, TIBC, IRON, RETICCTPCT,  in the last 72 hours  Coagulation profile  No results found for this basename: INR, PROTIME,  in the last 168 hours    Assessment & Plan   Hypertension - Will refill lisinopril with 3 refills. - Have asked her to call the clinic a few days before she runs out of her meds  Anxiety with depression - Claim she was previously on Effexor and took herself off it in February. She requests a psychiatric consultation as she feels some of her depression has come back. She currently has no suicidal or homicidal ideation - Will refer to psychiatry - Will provide one time prescription for Klonopin to her psychiatrist appointment is made  Seizure disorder - Continue with Keppra - Prescription with refills provided  History of brain AVM/SAH - She is being followed by Dr. Mikal Plane from neurosurgery. We will defer all treatment to Dr. Mikal Plane in this regard.  Gastroesophageal reflux disease - Have asked patient to continue taking her PPI for now.  Followup in 3 months

## 2012-09-01 NOTE — Progress Notes (Signed)
Patient states she needs refills for Klonopin and lisinopril.

## 2012-09-17 ENCOUNTER — Emergency Department (HOSPITAL_COMMUNITY)
Admission: EM | Admit: 2012-09-17 | Discharge: 2012-09-17 | Disposition: A | Discharge status: Home / Self Care | Payer: No Typology Code available for payment source | Attending: Emergency Medicine | Admitting: Emergency Medicine

## 2012-09-17 ENCOUNTER — Encounter (HOSPITAL_COMMUNITY): Payer: Self-pay | Admitting: *Deleted

## 2012-09-17 DIAGNOSIS — F411 Generalized anxiety disorder: Secondary | ICD-10-CM | POA: Insufficient documentation

## 2012-09-17 DIAGNOSIS — R51 Headache: Secondary | ICD-10-CM | POA: Insufficient documentation

## 2012-09-17 DIAGNOSIS — F3289 Other specified depressive episodes: Secondary | ICD-10-CM | POA: Insufficient documentation

## 2012-09-17 DIAGNOSIS — Z87891 Personal history of nicotine dependence: Secondary | ICD-10-CM | POA: Insufficient documentation

## 2012-09-17 DIAGNOSIS — IMO0001 Reserved for inherently not codable concepts without codable children: Secondary | ICD-10-CM | POA: Insufficient documentation

## 2012-09-17 DIAGNOSIS — F329 Major depressive disorder, single episode, unspecified: Secondary | ICD-10-CM | POA: Insufficient documentation

## 2012-09-17 DIAGNOSIS — I671 Cerebral aneurysm, nonruptured: Secondary | ICD-10-CM | POA: Insufficient documentation

## 2012-09-17 DIAGNOSIS — Z79899 Other long term (current) drug therapy: Secondary | ICD-10-CM | POA: Insufficient documentation

## 2012-09-17 DIAGNOSIS — I10 Essential (primary) hypertension: Secondary | ICD-10-CM | POA: Insufficient documentation

## 2012-09-17 DIAGNOSIS — Z8679 Personal history of other diseases of the circulatory system: Secondary | ICD-10-CM | POA: Insufficient documentation

## 2012-09-17 DIAGNOSIS — R110 Nausea: Secondary | ICD-10-CM | POA: Insufficient documentation

## 2012-09-17 HISTORY — DX: Cerebral aneurysm, nonruptured: I67.1

## 2012-09-17 MED ORDER — FENTANYL CITRATE 0.05 MG/ML IJ SOLN
50.0000 ug | Freq: Once | INTRAMUSCULAR | Status: AC
  Administered 2012-09-17: 50 ug via INTRAVENOUS
  Filled 2012-09-17: qty 2

## 2012-09-17 MED ORDER — SODIUM CHLORIDE 0.9 % IV BOLUS (SEPSIS)
250.0000 mL | Freq: Once | INTRAVENOUS | Status: AC
  Administered 2012-09-17: 250 mL via INTRAVENOUS

## 2012-09-17 MED ORDER — ONDANSETRON HCL 4 MG/2ML IJ SOLN
4.0000 mg | Freq: Once | INTRAMUSCULAR | Status: AC
  Administered 2012-09-17: 4 mg via INTRAVENOUS
  Filled 2012-09-17: qty 2

## 2012-09-17 MED ORDER — DIPHENHYDRAMINE HCL 50 MG/ML IJ SOLN
25.0000 mg | Freq: Once | INTRAMUSCULAR | Status: AC
  Administered 2012-09-17: 25 mg via INTRAVENOUS
  Filled 2012-09-17: qty 1

## 2012-09-17 MED ORDER — KETOROLAC TROMETHAMINE 30 MG/ML IJ SOLN
30.0000 mg | Freq: Once | INTRAMUSCULAR | Status: AC
  Administered 2012-09-17: 30 mg via INTRAVENOUS
  Filled 2012-09-17: qty 1

## 2012-09-17 MED ORDER — METOCLOPRAMIDE HCL 5 MG/ML IJ SOLN
10.0000 mg | Freq: Once | INTRAMUSCULAR | Status: AC
  Administered 2012-09-17: 10 mg via INTRAVENOUS
  Filled 2012-09-17: qty 2

## 2012-09-17 MED ORDER — SODIUM CHLORIDE 0.9 % IV SOLN
INTRAVENOUS | Status: DC
  Administered 2012-09-17: 09:00:00 via INTRAVENOUS

## 2012-09-17 NOTE — ED Notes (Addendum)
PT REPORTS A 3DAY HX OF hA WITH NAUSEA . PT REPORTS NOT EATING BUT IS ABLE TO KEEP LIQUIDS DOWN. pT ALSO REPORTS DIARRHEA X3 DAYS. 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

## 2012-09-17 NOTE — ED Provider Notes (Signed)
Patient seen/examined in the Emergency Department in conjunction with Midlevel Provider Harris Patient reports headache and diarrhea Exam : awake/alert, no arm drift, no facial droop.  GCS 15 Plan: pt reports gradual onset of HA and similar to prior headache.  I doubt SAH or other acute intrancranial hemorrhage.  Will treat symptoms and reassess.  Defer imaging for now    Joya Gaskins, MD 09/17/12 [phone]

## 2012-09-17 NOTE — ED Provider Notes (Signed)
History    CSN: [id number] Arrival date & time 09/17/12  4098  First MD Initiated Contact with Patient 09/17/12 0757     No chief complaint on file.  (Consider location/radiation/quality/duration/timing/severity/associated sxs/prior Treatment) HPI  Katie [name] is a 37 year old female with a history of fibromyalgia, migraines, and AVM of the brain with bleed in August of 2013.  The patient is followed by Dr. Franky Macho.  She is status failed AVM repair at [facility].  Repeat arteriogram done in May of this year confirmed the failed repair.  The patient states that she has a headache x3 days.  Onset was sudden that has been progressively worsening.  She has been able to keep down liquids and has had a small amount of food to eat over the past 3 days due to nausea.  She denies vomiting.  She denies phonophobia or photophobia however she has "muffled hearing."  Patient denies unilateral weakness or paresthesia, facial droop, dysarthria, dysphagia, visual changes.  Patient took Norco 5-325 without relief of her symptoms. Denies fevers, chills, myalgias, arthralgias. Denies DOE, SOB, chest tightness or pressure, radiation to left arm, jaw or back, or diaphoresis. Denies dysuria, flank pain, suprapubic pain, frequency, urgency, or hematuria.Denies abdominal pain, nausea, vomiting, diarrhea or constipation.   Past Medical History  Diagnosis Date  . Fibromyalgia   . Migraine   . Hypertension   . Depression   . Anxiety   . AVM (arteriovenous malformation)    Past Surgical History  Procedure Laterality Date  . Tubal ligation    . Cerebral embolization  dec 2013   No family history on file. History  Substance Use Topics  . Smoking status: Former Games developer  . Smokeless tobacco: Not on file  . Alcohol Use: No     Comment: occasional   OB History   Grav Para Term Preterm Abortions TAB SAB Ect Mult Living                 Review of Systems Ten systems reviewed and are negative for  acute change, except as noted in the HPI.   Allergies  Scallops  Home Medications   Current Outpatient Rx  Name  Route  Sig  Dispense  Refill  . clonazePAM (KLONOPIN) 0.5 MG tablet   Oral   Take 1 tablet (0.5 mg total) by mouth 2 (two) times daily as needed for anxiety. For anxiety   30 tablet   0   . levETIRAcetam (KEPPRA) 500 MG tablet   Oral   Take 1 tablet (500 mg total) by mouth every 12 (twelve) hours.   60 tablet   3   . lisinopril (PRINIVIL,ZESTRIL) 20 MG tablet   Oral   Take 1 tablet (20 mg total) by mouth daily.   30 tablet   3   . Omeprazole 20 MG TBEC   Oral   Take 1 tablet (20 mg total) by mouth daily as needed. 1-2 tabs daily as needed acid reflux   30 each   3   . oxyCODONE-acetaminophen (PERCOCET/ROXICET) 5-325 MG per tablet   Oral   Take 1 tablet by mouth every 6 (six) hours as needed for pain.   20 tablet   0    There were no vitals taken for this visit. Physical Exam Physical Exam  Nursing note and vitals reviewed. Constitutional: She is oriented to person, place, and time. She appears well-developed and well-nourished. No distress.  Appears uncomfortable. HENT:  Head: Normocephalic and atraumatic.  Eyes: Conjunctivae  normal and EOM are normal. Pupils are equal, round, and reactive to light. No scleral icterus.  horizontal nystagmus resolves after 3 beats Neck: Normal range of motion.  Cardiovascular: Normal rate, regular rhythm and normal heart sounds.  Exam reveals no gallop and no friction rub.   No murmur heard. Pulmonary/Chest: Effort normal and breath sounds normal. No respiratory distress.  Abdominal: Soft. Bowel sounds are normal. She exhibits no distension and no mass. There is no tenderness. There is no guarding.  Neurological: She is alert and oriented to person, place, and time.  Speech is clear and goal oriented, follows commands Major Cranial nerves without deficit, no facial droop Normal strength in upper and lower extremities  bilaterally including dorsiflexion and plantar flexion, strong and equal grip strength Deep tendon reflexes normal Sensation normal to light and sharp touch Moves extremities without ataxia, coordination intact Normal finger to nose and rapid alternating movements Skin: Skin is warm and dry. She is not diaphoretic.    ED Course  Procedures (including critical care time) Labs Reviewed - No data to display No results found. No diagnosis found.  MDM  8:41 AM  Filed Vitals:   09/17/12 0915 09/17/12 0930 09/17/12 1000 09/17/12 1015  BP: 93/59 90/53 96/61  103/70  Pulse: 50 50 62 73  Temp:      TempSrc:      SpO2: 100% 100% 100% 100%  , Patient is here with a headache.  She has no focal neurologic abnormalities.  Nystagmus has been documented on previous exams and appears benign.  I'm going to begin treatment of the patient's headache as if she has a migraine headache.  Patient has had multiple CT scans of the head and I would like to void more radiation if possible.  I've discussed this with Dr. Bebe Shaggy who will evaluate the patient as well.   11:18 AM Repeat neuro exam without focal deficit. She states that her pain and nausea have resolved, however she has pressure. She is able to ambulate without gait abnormality.    Arthor Captain, PA-C 09/17/12 1127

## 2012-09-18 NOTE — ED Provider Notes (Signed)
Medical screening examination/treatment/procedure(s) were conducted as a shared visit with non-physician practitioner(s) and myself.  I personally evaluated the patient during the encounter  Pt denied sudden onset of HA to me and her exam was unremarkable, and I doubt acute SAH or other acute neurologic process and she was ambulatory and safe for d/c home  Joya Gaskins, MD 09/18/12 1529

## 2012-11-03 ENCOUNTER — Encounter: Payer: No Typology Code available for payment source | Admitting: Obstetrics & Gynecology

## 2012-11-18 ENCOUNTER — Ambulatory Visit: Payer: Self-pay

## 2012-12-01 ENCOUNTER — Ambulatory Visit: Payer: Self-pay

## 2012-12-06 ENCOUNTER — Emergency Department (HOSPITAL_COMMUNITY): Payer: Self-pay

## 2012-12-06 ENCOUNTER — Emergency Department (HOSPITAL_COMMUNITY)
Admission: EM | Admit: 2012-12-06 | Discharge: 2012-12-06 | Disposition: A | Discharge status: Home / Self Care | Payer: Self-pay | Attending: Emergency Medicine | Admitting: Emergency Medicine

## 2012-12-06 ENCOUNTER — Encounter (HOSPITAL_COMMUNITY): Payer: Self-pay | Admitting: *Deleted

## 2012-12-06 DIAGNOSIS — F329 Major depressive disorder, single episode, unspecified: Secondary | ICD-10-CM | POA: Insufficient documentation

## 2012-12-06 DIAGNOSIS — R110 Nausea: Secondary | ICD-10-CM | POA: Insufficient documentation

## 2012-12-06 DIAGNOSIS — Z3202 Encounter for pregnancy test, result negative: Secondary | ICD-10-CM | POA: Insufficient documentation

## 2012-12-06 DIAGNOSIS — Z8669 Personal history of other diseases of the nervous system and sense organs: Secondary | ICD-10-CM | POA: Insufficient documentation

## 2012-12-06 DIAGNOSIS — Z87891 Personal history of nicotine dependence: Secondary | ICD-10-CM | POA: Insufficient documentation

## 2012-12-06 DIAGNOSIS — R569 Unspecified convulsions: Secondary | ICD-10-CM | POA: Insufficient documentation

## 2012-12-06 DIAGNOSIS — Z8679 Personal history of other diseases of the circulatory system: Secondary | ICD-10-CM | POA: Insufficient documentation

## 2012-12-06 DIAGNOSIS — R42 Dizziness and giddiness: Secondary | ICD-10-CM | POA: Insufficient documentation

## 2012-12-06 DIAGNOSIS — M549 Dorsalgia, unspecified: Secondary | ICD-10-CM | POA: Insufficient documentation

## 2012-12-06 DIAGNOSIS — R209 Unspecified disturbances of skin sensation: Secondary | ICD-10-CM | POA: Insufficient documentation

## 2012-12-06 DIAGNOSIS — F3289 Other specified depressive episodes: Secondary | ICD-10-CM | POA: Insufficient documentation

## 2012-12-06 DIAGNOSIS — IMO0001 Reserved for inherently not codable concepts without codable children: Secondary | ICD-10-CM | POA: Insufficient documentation

## 2012-12-06 DIAGNOSIS — I10 Essential (primary) hypertension: Secondary | ICD-10-CM | POA: Insufficient documentation

## 2012-12-06 DIAGNOSIS — M6281 Muscle weakness (generalized): Secondary | ICD-10-CM | POA: Insufficient documentation

## 2012-12-06 DIAGNOSIS — R51 Headache: Secondary | ICD-10-CM | POA: Insufficient documentation

## 2012-12-06 HISTORY — DX: Unspecified convulsions: R56.9

## 2012-12-06 LAB — CSF CELL COUNT WITH DIFFERENTIAL
Tube #: 4
WBC, CSF: 1 /mm3 (ref 0–5)

## 2012-12-06 LAB — PROTEIN, CSF: Total  Protein, CSF: 17 mg/dL (ref 15–45)

## 2012-12-06 LAB — GRAM STAIN

## 2012-12-06 LAB — BASIC METABOLIC PANEL
CO2: 26 mEq/L (ref 19–32)
Chloride: 103 mEq/L (ref 96–112)
GFR calc Af Amer: >90 mL/min (ref >90)
Potassium: 4.4 mEq/L (ref 3.5–5.1)
Sodium: 137 mEq/L (ref 135–145)

## 2012-12-06 LAB — GLUCOSE, CSF: Glucose, CSF: 50 mg/dL (ref 43–76)

## 2012-12-06 LAB — POCT PREGNANCY, URINE
Preg Test, Ur: NEGATIVE
Preg Test, Ur: NEGATIVE

## 2012-12-06 LAB — CBC
Platelets: 177 10*3/uL (ref 150–400)
RBC: 4.03 MIL/uL (ref 3.87–5.11)
WBC: 4.9 10*3/uL (ref 4.0–10.5)

## 2012-12-06 LAB — GLUCOSE, CAPILLARY: Glucose-Capillary: 74 mg/dL (ref 70–99)

## 2012-12-06 MED ORDER — IOHEXOL 350 MG/ML SOLN
80.0000 mL | Freq: Once | INTRAVENOUS | Status: AC | PRN
  Administered 2012-12-06: 80 mL via INTRAVENOUS

## 2012-12-06 MED ORDER — DIPHENHYDRAMINE HCL 50 MG/ML IJ SOLN
25.0000 mg | Freq: Once | INTRAMUSCULAR | Status: AC
  Administered 2012-12-06: 25 mg via INTRAVENOUS
  Filled 2012-12-06: qty 1

## 2012-12-06 MED ORDER — HYDROMORPHONE HCL PF 1 MG/ML IJ SOLN
1.0000 mg | Freq: Once | INTRAMUSCULAR | Status: AC
  Administered 2012-12-06: 1 mg via INTRAVENOUS
  Filled 2012-12-06: qty 1

## 2012-12-06 MED ORDER — SODIUM CHLORIDE 0.9 % IV BOLUS (SEPSIS)
1000.0000 mL | Freq: Once | INTRAVENOUS | Status: AC
  Administered 2012-12-06: 1000 mL via INTRAVENOUS

## 2012-12-06 MED ORDER — METOCLOPRAMIDE HCL 5 MG/ML IJ SOLN
5.0000 mg | Freq: Once | INTRAMUSCULAR | Status: AC
  Administered 2012-12-06: 5 mg via INTRAVENOUS
  Filled 2012-12-06: qty 2

## 2012-12-06 MED ORDER — OXYCODONE-ACETAMINOPHEN 5-325 MG PO TABS: 1.0000 | ORAL_TABLET | Freq: Four times a day (QID) | ORAL | Status: DC | PRN

## 2012-12-06 NOTE — ED Notes (Signed)
Pt states she gets headaches a lot because she has brain aneurysm and has active AVM.  Pt states today got headache with stabbing pain to top of head and developed weakness all over.  Pt states she may have had some confusion, but alert and oriented now.  Pt complains of both ears hurting and midback pain, and intermittent of neck going numb.

## 2012-12-06 NOTE — ED Provider Notes (Signed)
CSN: [id number]     Arrival date & time 12/06/12  1139 History   First MD Initiated Contact with Patient 12/06/12 1240     Chief Complaint  Patient presents with  . Headache  . Weakness   (Consider location/radiation/quality/duration/timing/severity/associated sxs/prior Treatment) HPI This is a 37 year old female with a history of AVM and cerebral aneurysm who presents with a headache and lightheadedness.  Patient reports onset of pain this morning when she got to work. She said she woke up and she felt fine. At work she had sudden onset of lightheadedness. She laid down when she got back up she felt nauseated. She has not vomited. She states the pain is in back of her head and stabbing. She states the headache came on all of a sudden. It is currently 8/10. She denies any vision changes. She denies any focal weakness or numbness. She does state that she has had pain over her mid back and feels like her neck has been going numb for the last several weeks.  She states this feels somewhat like at the time that her aneurysm burst. Past Medical History  Diagnosis Date  . Fibromyalgia   . Migraine   . Hypertension   . Depression   . Anxiety   . AVM (arteriovenous malformation)   . Cerebral aneurysm   . Seizures    Past Surgical History  Procedure Laterality Date  . Tubal ligation    . Cerebral embolization  dec 2013   No family history on file. History  Substance Use Topics  . Smoking status: Former Games developer  . Smokeless tobacco: Not on file  . Alcohol Use: No     Comment: occasional   OB History   Grav Para Term Preterm Abortions TAB SAB Ect Mult Living                 Review of Systems  Constitutional: Negative for fever.  HENT: Negative for neck stiffness.        Neck numbness  Eyes: Negative for photophobia and visual disturbance.  Respiratory: Negative for cough, chest tightness and shortness of breath.   Cardiovascular: Negative for chest pain.  Gastrointestinal: Negative  for nausea, vomiting and abdominal pain.  Genitourinary: Negative for dysuria.  Musculoskeletal: Negative for back pain and gait problem.  Skin: Negative for rash.  Neurological: Positive for light-headedness and headaches. Negative for dizziness, seizures, syncope, speech difficulty and weakness.  Psychiatric/Behavioral: Negative for confusion.  All other systems reviewed and are negative.    Allergies  Scallops  Home Medications   Current Outpatient Rx  Name  Route  Sig  Dispense  Refill  . clonazePAM (KLONOPIN) 0.5 MG tablet   Oral   Take 1 tablet (0.5 mg total) by mouth 2 (two) times daily as needed for anxiety. For anxiety   30 tablet   0   . GARCINIA CAMBOGIA-CHROMIUM PO   Oral   Take 1 tablet by mouth 4 (four) times daily - after meals and at bedtime.         Marland Kitchen HYDROcodone-acetaminophen (NORCO/VICODIN) 5-325 MG per tablet   Oral   Take 1 tablet by mouth 2 (two) times daily as needed for pain. For pain         . levETIRAcetam (KEPPRA) 500 MG tablet   Oral   Take 1 tablet (500 mg total) by mouth every 12 (twelve) hours.   60 tablet   3   . lisinopril (PRINIVIL,ZESTRIL) 20 MG tablet   Oral  Take 20 mg by mouth every morning.         Marland Kitchen oxyCODONE-acetaminophen (PERCOCET/ROXICET) 5-325 MG per tablet   Oral   Take 1 tablet by mouth every 6 (six) hours as needed for pain.   10 tablet   0    BP 105/71  Pulse 79  Temp(Src) 98.7 F (37.1 C) (Oral)  Resp 20  SpO2 100%  LMP 12/05/2012 Physical Exam  Nursing note and vitals reviewed. Constitutional: She is oriented to person, place, and time. She appears well-developed and well-nourished. No distress.  HENT:  Head: Normocephalic and atraumatic.  Eyes: EOM are normal. Pupils are equal, round, and reactive to light.  Neck: Neck supple.  Cardiovascular: Normal rate, regular rhythm and normal heart sounds.   Pulmonary/Chest: Effort normal and breath sounds normal. No respiratory distress. She has no wheezes.   Abdominal: Soft. Bowel sounds are normal.  Neurological: She is alert and oriented to person, place, and time. No cranial nerve deficit. Coordination normal.  No dysmetria to finger-nose-finger, 4/4 strength in the bilateral upper and lower extremities, sensation intact to light touch  Skin: Skin is warm and dry.  Psychiatric: She has a normal mood and affect.    ED Course  LUMBAR PUNCTURE Date/Time: 12/06/2012 7:24 PM Performed by: Ross Marcus, F Authorized by: Ross Marcus, F Consent: Verbal consent obtained. written consent obtained. Risks and benefits: risks, benefits and alternatives were discussed Consent given by: patient Patient understanding: patient states understanding of the procedure being performed Indications: evaluation for subarachnoid hemorrhage Local anesthetic: lidocaine 1% with epinephrine Anesthetic total: 5 ml Patient sedated: no Preparation: Patient was prepped and draped in the usual sterile fashion. Lumbar space: L3-L4 interspace Patient's position: left lateral decubitus Needle gauge: 20 Needle type: spinal needle - Quincke tip Needle length: 3.5 in Number of attempts: 1 Fluid appearance: clear Tubes of fluid: 4 Total volume: 8 ml Post-procedure: site cleaned and adhesive bandage applied Patient tolerance: Patient tolerated the procedure well with no immediate complications.   (including critical care time)  . Labs Review Labs Reviewed  CSF CELL COUNT WITH DIFFERENTIAL - Abnormal; Notable for the following:    RBC Count, CSF 109 (*)    All other components within normal limits  GRAM STAIN  CSF CULTURE  GLUCOSE, CAPILLARY  CBC  BASIC METABOLIC PANEL  CSF CELL COUNT WITH DIFFERENTIAL  GLUCOSE, CSF  PROTEIN, CSF  POCT PREGNANCY, URINE  POCT PREGNANCY, URINE   Imaging Review Ct Angio Head W/cm &/or Wo Cm  12/06/2012   CLINICAL DATA:  History of ruptured arteriovenous malformation. Pain on the right.  EXAM: CT ANGIOGRAPHY HEAD   TECHNIQUE: Multidetector CT imaging of the head was performed using the standard protocol during bolus administration of intravenous contrast. Multiplanar CT image reconstructions including MIPs were obtained to evaluate the vascular anatomy.  CONTRAST:  80mL OMNIPAQUE IOHEXOL 350 MG/ML SOLN  COMPARISON:  CT 05/14/2012. Angiography 07/27/2012.  MRI 12/10/2011.  FINDINGS: Scanning of the brain shows a normal appearance of the brainstem and cerebellum. There is extensive embolic material in the right parietal region related to previous treatment at another institution. I did not see evidence of intracranial hemorrhage at this time. No edema. No mass effect or shift. No hydrocephalus. No extra-axial collection.  Both internal carotid arteries are widely patent into the brain. No siphon stenosis. The anterior and middle cerebral vessels are patent without proximal stenosis, or aneurysm.  Both vertebral arteries are patent to the basilar. No basilar stenosis. Posterior circulation  branch vessels are patent.  In the region of the previously embolized arteriovenous malformation, there are some persist at visible abnormal vessels consistent with an element of residual arteriovenous malformation. Venous drainage is probably both to superficial veins as well as to the deep venous system extending to the straight sinus region/internal cerebral veins.  Review of the MIP images confirms the above findings.  IMPRESSION: No evidence of acute hemorrhage.  Previous embolization of a right parietal arteriovenous malformation. Some persistent abnormal vessels in the region indicating complete treatment. There appears to be both deep and superficial drainage.   Electronically Signed   By: Paulina Fusi M.D.   On: 12/06/2012 14:55    MDM   1. Headache   2. History of aneurysm    Patient with headache, lightheadedness, and numbness of the neck.  Hx of AVM and aneurysm.  REports similar symptoms to prior.  VS wnl.  Patient's  neurologic exam is completely normal.  Patient's symptoms began at 6:30, approximately 7-7.5 hours PTA.  Patient given NS bolus, reglan and benadryl for pain.  CT/CTA negative for acute bleed or abnormality.  LP negative for SAH.  Patient had improvement of symptoms with HA cocktail.  Will be discharged home.  After history, exam, and medical workup I feel the patient has been appropriately medically screened and is safe for discharge home. Pertinent diagnoses were discussed with the patient. Patient was given return precautions.    Shon Baton, MD 12/06/12 [phone]

## 2012-12-06 NOTE — ED Notes (Signed)
1 IV Attempt. RN notified.

## 2012-12-07 ENCOUNTER — Ambulatory Visit: Payer: No Typology Code available for payment source | Attending: Internal Medicine

## 2012-12-08 ENCOUNTER — Emergency Department (HOSPITAL_COMMUNITY)
Admission: EM | Admit: 2012-12-08 | Discharge: 2012-12-08 | Disposition: A | Discharge status: Home / Self Care | Payer: No Typology Code available for payment source | Attending: Emergency Medicine | Admitting: Emergency Medicine

## 2012-12-08 ENCOUNTER — Encounter (HOSPITAL_COMMUNITY): Payer: Self-pay | Admitting: Emergency Medicine

## 2012-12-08 DIAGNOSIS — F411 Generalized anxiety disorder: Secondary | ICD-10-CM | POA: Insufficient documentation

## 2012-12-08 DIAGNOSIS — F329 Major depressive disorder, single episode, unspecified: Secondary | ICD-10-CM | POA: Insufficient documentation

## 2012-12-08 DIAGNOSIS — Z3202 Encounter for pregnancy test, result negative: Secondary | ICD-10-CM | POA: Insufficient documentation

## 2012-12-08 DIAGNOSIS — IMO0001 Reserved for inherently not codable concepts without codable children: Secondary | ICD-10-CM | POA: Insufficient documentation

## 2012-12-08 DIAGNOSIS — Q279 Congenital malformation of peripheral vascular system, unspecified: Secondary | ICD-10-CM | POA: Insufficient documentation

## 2012-12-08 DIAGNOSIS — F3289 Other specified depressive episodes: Secondary | ICD-10-CM | POA: Insufficient documentation

## 2012-12-08 DIAGNOSIS — I10 Essential (primary) hypertension: Secondary | ICD-10-CM | POA: Insufficient documentation

## 2012-12-08 DIAGNOSIS — G43909 Migraine, unspecified, not intractable, without status migrainosus: Secondary | ICD-10-CM | POA: Insufficient documentation

## 2012-12-08 DIAGNOSIS — G40909 Epilepsy, unspecified, not intractable, without status epilepticus: Secondary | ICD-10-CM | POA: Insufficient documentation

## 2012-12-08 LAB — URINE MICROSCOPIC-ADD ON

## 2012-12-08 LAB — URINALYSIS, ROUTINE W REFLEX MICROSCOPIC
Glucose, UA: NEGATIVE mg/dL
Hgb urine dipstick: NEGATIVE
Ketones, ur: NEGATIVE mg/dL
Protein, ur: NEGATIVE mg/dL
Urobilinogen, UA: 0.2 mg/dL (ref 0.0–1.0)

## 2012-12-08 MED ORDER — PROMETHAZINE HCL 25 MG PO TABS: 25.0000 mg | ORAL_TABLET | Freq: Four times a day (QID) | ORAL | Status: DC | PRN

## 2012-12-08 MED ORDER — SODIUM CHLORIDE 0.9 % IV BOLUS (SEPSIS)
1000.0000 mL | Freq: Once | INTRAVENOUS | Status: AC
  Administered 2012-12-08: 1000 mL via INTRAVENOUS

## 2012-12-08 MED ORDER — HYDROMORPHONE HCL PF 1 MG/ML IJ SOLN
1.0000 mg | Freq: Once | INTRAMUSCULAR | Status: AC
  Administered 2012-12-08: 1 mg via INTRAVENOUS
  Filled 2012-12-08: qty 1

## 2012-12-08 MED ORDER — DIPHENHYDRAMINE HCL 50 MG/ML IJ SOLN
12.5000 mg | Freq: Once | INTRAMUSCULAR | Status: AC
  Administered 2012-12-08: 12.5 mg via INTRAVENOUS
  Filled 2012-12-08: qty 1

## 2012-12-08 MED ORDER — PROCHLORPERAZINE EDISYLATE 5 MG/ML IJ SOLN
10.0000 mg | Freq: Once | INTRAMUSCULAR | Status: AC
  Administered 2012-12-08: 10 mg via INTRAVENOUS
  Filled 2012-12-08: qty 2

## 2012-12-08 MED ORDER — BUTALBITAL-APAP-CAFFEINE 50-325-40 MG PO TABS: 1.0000 | ORAL_TABLET | Freq: Four times a day (QID) | ORAL | Status: DC | PRN

## 2012-12-08 MED ORDER — METOCLOPRAMIDE HCL 5 MG/ML IJ SOLN
10.0000 mg | Freq: Once | INTRAMUSCULAR | Status: AC
  Administered 2012-12-08: 10 mg via INTRAVENOUS
  Filled 2012-12-08: qty 2

## 2012-12-08 NOTE — ED Notes (Signed)
Pt sat on side of bed and headache returned at "7" out of "10"

## 2012-12-08 NOTE — ED Notes (Signed)
Was seen here on the 15 and had a spinal tap head still hurting, states has an AVM and an annurysism pt staes is nauseated

## 2012-12-08 NOTE — ED Provider Notes (Signed)
CSN: [id number]     Arrival date & time 12/08/12  1025 History   First MD Initiated Contact with Patient 12/08/12 1121     Chief Complaint  Patient presents with  . Headache   (Consider location/radiation/quality/duration/timing/severity/associated sxs/prior Treatment) HPI  Katie [name] is a 37 y.o. female with past medical history significant for fibromyalgia, migraine and AVM (incomplete repair with coil at [facility]) complaining of headaches and she was evaluated on the 15th for headache. Patient states that since that time she had a spinal tap the headache is worsening. She states that when she takes Percocet it makes the pain worse, she also states that when she sits up it makes the pain worse. Patient said that this headache is different from others however she is very difficult time describing the difference, she states that this is worse and it is less on the left than normal. States pain radiates down the neck and back.  Patient describes the pain as throbbing, 10 out of 10, diffuse associated with nausea and blurred vision. Patient denies photo or phonophobia, dysarthria, ataxia, focal weakness, numbness or paresthesia, emesis,  Neurologist Cabbell  Past Medical History  Diagnosis Date  . Fibromyalgia   . Migraine   . Hypertension   . Depression   . Anxiety   . AVM (arteriovenous malformation)   . Cerebral aneurysm   . Seizures    Past Surgical History  Procedure Laterality Date  . Tubal ligation    . Cerebral embolization  dec 2013   No family history on file. History  Substance Use Topics  . Smoking status: Former Games developer  . Smokeless tobacco: Not on file  . Alcohol Use: No     Comment: occasional   OB History   Grav Para Term Preterm Abortions TAB SAB Ect Mult Living                 Review of Systems  10 systems reviewed and found to be negative, except as noted in the HPI   Allergies  Scallops  Home Medications   Current Outpatient Rx  Name  Route  Sig   Dispense  Refill  . clonazePAM (KLONOPIN) 0.5 MG tablet   Oral   Take 1 tablet (0.5 mg total) by mouth 2 (two) times daily as needed for anxiety. For anxiety   30 tablet   0   . GARCINIA CAMBOGIA-CHROMIUM PO   Oral   Take 1 tablet by mouth 4 (four) times daily - after meals and at bedtime.         Marland Kitchen HYDROcodone-acetaminophen (NORCO/VICODIN) 5-325 MG per tablet   Oral   Take 1 tablet by mouth 2 (two) times daily as needed for pain. For pain         . levETIRAcetam (KEPPRA) 500 MG tablet   Oral   Take 1 tablet (500 mg total) by mouth every 12 (twelve) hours.   60 tablet   3   . lisinopril (PRINIVIL,ZESTRIL) 20 MG tablet   Oral   Take 20 mg by mouth every morning.         Marland Kitchen oxyCODONE-acetaminophen (PERCOCET/ROXICET) 5-325 MG per tablet   Oral   Take 1 tablet by mouth every 6 (six) hours as needed for pain.   10 tablet   0    BP 117/86  Pulse 71  Temp(Src) 98.2 F (36.8 C)  Resp 18  SpO2 100%  LMP 12/05/2012 Physical Exam  Nursing note and vitals reviewed. Constitutional: She is oriented  to person, place, and time. She appears well-developed and well-nourished. No distress.  HENT:  Head: Normocephalic.  Mouth/Throat: Oropharynx is clear and moist.  Eyes: Conjunctivae and EOM are normal. Pupils are equal, round, and reactive to light.  Neck: Normal range of motion.  Deep palpation of midline C-spine relieves pt's pain  Cardiovascular: Normal rate, regular rhythm and intact distal pulses.   Pulmonary/Chest: Effort normal and breath sounds normal. No stridor. No respiratory distress. She has no wheezes. She has no rales. She exhibits no tenderness.  Abdominal: Soft. She exhibits no distension and no mass. There is no tenderness. There is no rebound and no guarding.  Musculoskeletal: Normal range of motion.  Neurological: She is alert and oriented to person, place, and time. No cranial nerve deficit.  Cranial nerves III through XII intact, strength 5 out of 5x4  extremities, negative pronator drift, finger to nose and heel-to-shin coordinated, sensation intact to pinprick and light touch, gait is coordinated and Romberg is negative.    Psychiatric: She has a normal mood and affect.    ED Course  Procedures (including critical care time) Labs Review Labs Reviewed  URINALYSIS, ROUTINE W REFLEX MICROSCOPIC - Abnormal; Notable for the following:    APPearance CLOUDY (*)    Leukocytes, UA TRACE (*)    All other components within normal limits  URINE MICROSCOPIC-ADD ON - Abnormal; Notable for the following:    Squamous Epithelial / LPF FEW (*)    Bacteria, UA MANY (*)    All other components within normal limits  URINE CULTURE  POCT PREGNANCY, URINE   Imaging Review No results found.  MDM   1. Headache    Filed Vitals:   12/08/12 1121 12/08/12 1123 12/08/12 1145 12/08/12 1416  BP: 114/86 117/86 113/68 98/66  Pulse: 74 71 72 67  Temp:    97.6 F (36.4 C)  TempSrc:    Oral  Resp: 19 18 19 16   SpO2: 99% 100% 100% 97%     Katie [name] is a 37 y.o. female with past medical history significant for fibromyalgia, migraine and AVM with bleed, status post coil with insufficient effectiveness, followed by  neurosurgeon Dr. Mikal Plane. Was seen for headache 2 days ago had CT angiogram with LP all negative, she comes in today complaining of headache exacerbation since that time. She states that Percocet makes the pain worse. Patient has no focal neuro findings. This is a shared visit with attending physician Dr. Fredderick Phenix who agrees with care plan and stability to discharge to home. Neurosurgery consult from Dr. Mikal Plane appreciated: He states that given her recent workup and current exam he will defer admission and further imaging at this time. He would like her to followup with him in the office. He states that he tried to call her last night but she did not pick up the phone. I've spoken with the patient and have advised her that it is important that she  follows up as an outpatient. We have discussed red flags for return precautions. Patient's pain is very difficult to control, very minimal relief with both Compazine and Dilaudid. I will try Reglan and Benadryl.  Medications  prochlorperazine (COMPAZINE) injection 10 mg (10 mg Intravenous Given 12/08/12 1208)  sodium chloride 0.9 % bolus 1,000 mL (0 mLs Intravenous Stopped 12/08/12 1329)  HYDROmorphone (DILAUDID) injection 1 mg (1 mg Intravenous Given 12/08/12 1332)  metoCLOPramide (REGLAN) injection 10 mg (10 mg Intravenous Given 12/08/12 1439)  diphenhydrAMINE (BENADRYL) injection 12.5 mg (12.5 mg Intravenous  Given 12/08/12 1439)    Pt is hemodynamically stable, appropriate for, and amenable to discharge at this time. Pt verbalized understanding and agrees with care plan. All questions answered. Outpatient follow-up and specific return precautions discussed.    New Prescriptions   BUTALBITAL-ACETAMINOPHEN-CAFFEINE (FIORICET) 50-325-40 MG PER TABLET    Take 1 tablet by mouth every 6 (six) hours as needed for headache.   PROMETHAZINE (PHENERGAN) 25 MG TABLET    Take 1 tablet (25 mg total) by mouth every 6 (six) hours as needed for nausea.   Note: Portions of this report may have been transcribed using voice recognition software. Every effort was made to ensure accuracy; however, inadvertent computerized transcription errors may be present      Wynetta Emery, PA-C 12/08/12 1454

## 2012-12-08 NOTE — ED Provider Notes (Signed)
Medical screening examination/treatment/procedure(s) were conducted as a shared visit with non-physician practitioner(s) and myself.  I personally evaluated the patient during the encounter Patient with a history of AVM status post aneurysmal rupture presents with headache.  Her AVM has been inadequately treated. She was here 2 days ago and had a negative LPN CTA. This headache seems to have a spinal component to it as it's worse when she is upright and better when she's laying down. She states it's not significantly different than her past headaches. She denies any neurologic deficits. Her neurosurgeon, Dr Franky Macho was consulted and he advised patient needs to followup in his office. At this point I did not feel that repeat CT imaging or LP was indicated.  Rolan Bucco, MD 12/08/12 [phone]

## 2012-12-09 LAB — URINE CULTURE: Colony Count: >=100000

## 2012-12-10 LAB — CSF CULTURE W GRAM STAIN: Culture: NO GROWTH

## 2013-01-27 ENCOUNTER — Other Ambulatory Visit: Payer: Self-pay

## 2013-02-01 ENCOUNTER — Ambulatory Visit: Payer: Self-pay

## 2013-02-01 ENCOUNTER — Ambulatory Visit: Payer: Self-pay | Admitting: Radiation Oncology

## 2013-02-22 ENCOUNTER — Encounter: Payer: Self-pay | Admitting: Radiation Oncology

## 2013-02-22 DIAGNOSIS — Q282 Arteriovenous malformation of cerebral vessels: Secondary | ICD-10-CM

## 2013-02-22 HISTORY — DX: Arteriovenous malformation of cerebral vessels: Q28.2

## 2013-02-22 NOTE — Progress Notes (Signed)
AVM  37 year old female.   Hx of AVM and cerebral aneurysm managed by Dr. Franky Macho. Presented to the ED September 2014 complaining of headache, lightheadedness, nausea, and neck numbness.   CT angio done 9/15 confirmed in the region of the previously embolized arteriovenous malformation, there are some persistant visible abnormal vessels consistent with an element of residual AVM.   [id number] No hx of radiation therapy No indication of a pacemaker

## 2013-02-23 ENCOUNTER — Encounter: Payer: Self-pay | Admitting: Radiation Oncology

## 2013-02-23 ENCOUNTER — Ambulatory Visit
Admission: RE | Admit: 2013-02-23 | Discharge: 2013-02-23 | Disposition: A | Discharge status: Home / Self Care | Payer: No Typology Code available for payment source | Source: Ambulatory Visit | Attending: Radiation Oncology | Admitting: Radiation Oncology

## 2013-02-23 ENCOUNTER — Ambulatory Visit: Payer: No Typology Code available for payment source

## 2013-02-23 ENCOUNTER — Ambulatory Visit: Payer: Self-pay | Admitting: Radiation Oncology

## 2013-02-23 VITALS — BP 111/69 | HR 76 | Temp 97.8°F | Resp 16 | Ht 61.0 in | Wt 161.5 lb

## 2013-02-23 DIAGNOSIS — Q282 Arteriovenous malformation of cerebral vessels: Secondary | ICD-10-CM

## 2013-02-23 DIAGNOSIS — I671 Cerebral aneurysm, nonruptured: Secondary | ICD-10-CM

## 2013-02-23 DIAGNOSIS — Q283 Other malformations of cerebral vessels: Secondary | ICD-10-CM | POA: Insufficient documentation

## 2013-02-23 NOTE — Progress Notes (Signed)
Complete PATIENT MEASURE OF DISTRESS worksheet with a score of 8 submitted to social work.  

## 2013-02-23 NOTE — Progress Notes (Signed)
Reports intermittent tingling and numbness over her entire head. Reports numbness in her left side posterior neck continues. Reports daily headaches. Reports headache 5 on a scale of 0-10 presently. Patient reports the only medications she takes at this time are keppra and lisinopril. Patient does not have any prescribed pain medication to take for her daily headaches because she doesn't have insurance. Patient reports she will have insurance coverage with [employer] in January. Reports taking OTC advil and motrin PM. Reports ringing in the ears and occasional muffled hearing. Reports floaters but, denies diplopia. Reports occasional episodes of dizziness. Reports she experience once episode of stuttering and repeating herself while on the phone with her friend approximately one month ago. Reports she has intentionally lost 30 lbs.

## 2013-02-23 NOTE — Progress Notes (Signed)
See progress note under physician encounter. 

## 2013-02-27 ENCOUNTER — Encounter: Payer: Self-pay | Admitting: Radiation Oncology

## 2013-02-27 NOTE — Progress Notes (Signed)
Radiation Oncology         [phone] ________________________________  Initial outpatient Consultation  Name: Katie [name] MRN: [id number]  Date: 02/23/2013  DOB: 1975/06/15  WU:[id number], Keane Scrape, MD  Carmela Hurt, MD   REFERRING PHYSICIAN: Carmela Hurt, MD  DIAGNOSIS: 37 year old woman with an Arteriovenous Malformation  HISTORY OF PRESENT ILLNESS::Katie [name] is a 37 y.o. female who presented 11/19/11 with sudden onset of headache and decrease consciousness. Ct head was done and showed hemorrhage throughout the ventricular system. 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  The nidus of the arteriovenous malformation in the vertical dimension measured approximately 13 mm.  She underwent Onyx embolization on 03/03/12 at [facility].  She continued to have headaches and some seizure activity.  Follow-up cerebral angiogram was performed on 07/27/12.  This study showed evidence of previous branch embolizations with a liquid embolic, but, there was angiographic evidence of a fast flow right parietal subcortical arteriovenous malformation, with the nidus measuring approximately 2.9 cm x 6 mm. This was seen to drain directly into the venous conduit which drains into the right internal cerebral vein and subsequently the vein of Galen, or deep and superficial drainage.  Clinically, the patient has suffered with recurring headaches with multiple ED visits.  Resulting head CTs have not demonstrated any further hemorrhage.  The location and size of the AVM has raised concern that surgical resection would cause post-operative neurologic deficits.  She has kindly been referred today to discuss stereotactic radiosurgery.  PREVIOUS RADIATION THERAPY: No  PAST MEDICAL HISTORY:  has a past medical history of Fibromyalgia; Migraine; Hypertension; Depression; Anxiety; AVM (arteriovenous malformation); Cerebral aneurysm; and Seizures.     PAST SURGICAL HISTORY: Past Surgical History  Procedure Laterality Date  . Tubal ligation    . Cerebral embolization  dec 2013    FAMILY HISTORY: family history is not on file.  SOCIAL HISTORY:  reports that she has quit smoking. She does not have any smokeless tobacco history on file. She reports that she does not drink alcohol or use illicit drugs.  ALLERGIES: Review of patient's allergies indicates no active allergies.  MEDICATIONS:  Current Outpatient Prescriptions  Medication Sig Dispense Refill  . levETIRAcetam (KEPPRA) 500 MG tablet Take 1 tablet (500 mg total) by mouth every 12 (twelve) hours.  60 tablet  3  . lisinopril (PRINIVIL,ZESTRIL) 20 MG tablet Take 20 mg by mouth every morning.      . butalbital-acetaminophen-caffeine (FIORICET) 50-325-40 MG per tablet Take 1 tablet by mouth every 6 (six) hours as needed for headache.  20 tablet  0  . clonazePAM (KLONOPIN) 0.5 MG tablet Take 1 tablet (0.5 mg total) by mouth 2 (two) times daily as needed for anxiety. For anxiety  30 tablet  0  . GARCINIA CAMBOGIA-CHROMIUM PO Take 1 tablet by mouth 4 (four) times daily - after meals and at bedtime.      Marland Kitchen HYDROcodone-acetaminophen (NORCO/VICODIN) 5-325 MG per tablet Take 1 tablet by mouth 2 (two) times daily as needed for pain. For pain      . HYDROcodone-acetaminophen (NORCO/VICODIN) 5-325 MG per tablet 1 tablet.      Marland Kitchen oxyCODONE-acetaminophen (PERCOCET/ROXICET) 5-325 MG per tablet Take 1 tablet by mouth every 6 (six) hours as needed for pain.  10 tablet  0  . promethazine (PHENERGAN) 25 MG tablet Take 1 tablet (25 mg total) by mouth every 6 (six) hours as needed for nausea.  12 tablet  0  . [DISCONTINUED] esomeprazole (NEXIUM) 40 MG capsule Take 40 mg by mouth daily before breakfast. Dispense as written       No current facility-administered medications for this encounter.    REVIEW OF SYSTEMS:  A 15 point review of systems is documented in the electronic medical record. This was  obtained by the nursing staff. However, I reviewed this with the patient to discuss relevant findings and make appropriate changes.  Pertinent items are noted in HPI.   PHYSICAL EXAM:  height is 5\' 1"  (1.549 m) and weight is 161 lb 8 oz (73.256 kg). Her oral temperature is 97.8 F (36.6 C). Her blood pressure is 111/69 and her pulse is 76. Her respiration is 16.   She is alert and oriented.  She is neurologically intact.  A complete physical exam in the ED on 9/17 revealed the following: Constitutional: She is oriented to person, place, and time. She appears well-developed and well-nourished. No distress.  HENT:  Head: Normocephalic.  Mouth/Throat: Oropharynx is clear and moist.  Eyes: Conjunctivae and EOM are normal. Pupils are equal, round, and reactive to light.  Neck: Normal range of motion.  Deep palpation of midline C-spine relieves pt's pain  Cardiovascular: Normal rate, regular rhythm and intact distal pulses.  Pulmonary/Chest: Effort normal and breath sounds normal. No stridor. No respiratory distress. She has no wheezes. She has no rales. She exhibits no tenderness.  Abdominal: Soft. She exhibits no distension and no mass. There is no tenderness. There is no rebound and no guarding.  Musculoskeletal: Normal range of motion.  Neurological: She is alert and oriented to person, place, and time. No cranial nerve deficit.  Cranial nerves III through XII intact, strength 5 out of 5x4 extremities, negative pronator drift, finger to nose and heel-to-shin coordinated, sensation intact to pinprick and light touch, gait is coordinated and Romberg is negative.  Psychiatric: She has a normal mood and affect.    KPS = 90  100 - Normal; no complaints; no evidence of disease. 90   - Able to carry on normal activity; minor signs or symptoms of disease. 80   - Normal activity with effort; some signs or symptoms of disease. 63   - Cares for self; unable to carry on normal activity or to do active  work. 60   - Requires occasional assistance, but is able to care for most of his personal needs. 50   - Requires considerable assistance and frequent medical care. 40   - Disabled; requires special care and assistance. 30   - Severely disabled; hospital admission is indicated although death not imminent. 20   - Very sick; hospital admission necessary; active supportive treatment necessary. 10   - Moribund; fatal processes progressing rapidly. 0     - Dead  Karnofsky DA, Abelmann WH, Craver LS and Burchenal [facility] [phone]) The use of the nitrogen mustards in the palliative treatment of carcinoma: with particular reference to bronchogenic carcinoma Cancer 1 [id number]  LABORATORY DATA:  Lab Results  Component Value Date   WBC 4.9 12/06/2012   HGB 12.5 12/06/2012   HCT 36.1 12/06/2012   MCV 89.6 12/06/2012   PLT 177 12/06/2012   Lab Results  Component Value Date   NA 137 12/06/2012   K 4.4 12/06/2012   CL 103 12/06/2012   CO2 26 12/06/2012   Lab Results  Component Value Date   ALT 15 11/19/2011   AST 19 11/19/2011   ALKPHOS 65 11/19/2011   BILITOT 0.3  11/19/2011     RADIOGRAPHY: Films reviewed.    IMPRESSION: This patient is a very nice 37 year old woman with a 2.9 cm right parietal Arteriovenous Malformation with superficial and deep venous drainage.    Spetzler-[name] Grading System for AVM  AVM Size Adjacent Eloquent Cortex Draining Veins  Under 3 cm = 1 Non-Eloquent = 0 Superficial =0  3-6 cm = 2 Eloquent =1 Deep =1  Over 6 cm = 3    Grade = 3  Although her Spetzler-[name] grade is typically associated with a low risk for surgical toxicity, the location and size of her AVM are felt to carry a significant risk for postoperative deficits. Accordingly, she may benefit from stereotactic radiosurgery.   PLAN:Today, I talked to the patient and family about the findings and work-up thus far.  We discussed the natural history of disease and general treatment, highlighting the role of stereotactic  radiosurgery in the management.  We discussed the available radiation techniques including invasive head frame versus thermoplastic mask, and focused on the details of logistics and delivery.  We reviewed the anticipated acute and late sequelae associated with radiation in this setting including radiation necrosis.  The patient was encouraged to ask questions that I answered to the best of my ability.   The patient would like to proceed with stereotactic radiosurgery and will be scheduled for planning and treatment in the near future.  I spent 60 minutes minutes face to face with the patient and more than 50% of that time was spent in counseling and/or coordination of care.    ------------------------------------------------  Artist Pais. Kathrynn Running, M.D.

## 2013-03-18 ENCOUNTER — Encounter: Payer: Self-pay | Admitting: Radiation Oncology

## 2013-03-18 ENCOUNTER — Encounter: Payer: Self-pay | Admitting: *Deleted

## 2013-03-18 NOTE — Progress Notes (Signed)
[facility] Psychosocial Distress Screening Clinical Social Work  Clinical Social Work was referred by distress screening protocol.  The patient scored a 8 on the Psychosocial Distress Thermometer which indicates severe distress. Clinical Social Worker contacted patient by phone to assess for distress and other psychosocial needs. The patient states she is very overwhelmed with current medical condition and feels depressed due to current situation.  CSW provided brief support and encouraged patient to utilize [facility] counseling services.  The patient indicates she plans to use a community mental health provider, but agreed to contact CSW to utilize services or if she has any questions or concerns.  The patient had confusion regarding upcoming appointment times, CSW agreed to call radiation oncology to confirm appointments.   Clinical Social Worker follow up needed: yes  If yes, follow up plan:  CSW contacted Jalene Mullet, [facility] navigator, regarding patient's treatment.  Jalene Mullet plans to contact patient and clarify upcoming appointments.   Kathrin Penner, MSW, LCSW Clinical Social Worker [facility] [phone]

## 2013-03-18 NOTE — Progress Notes (Signed)
Result Impression    1.  Sequela of recent embolization of right parietal lobe arteriovenous malformation, with high T2 signal and restricted diffusion which may reflect ischemia and/or susceptibility artifact from embolization material.  2.  Small focus of high T2 FLAIR signal and diffusion restriction within the right sylvian fissure, which corresponds with a high density structure seen on recent head CT. This is favored to represent a small focus of subarachnoid hemorrhage given the high density on recent head CT, with ischemia and/or infarction being considered less likely.  3.  Given similar signal of normal vascular flow void and MRI material, the current study cannot and adequately evaluate whether there is embolization material within the vein of Galen (as questioned on previous CT). Repeat imaging with intravascular contrast material could further evaluate.   Result Narrative  MRI BRAIN WITHOUT CONTRAST (NEUROLOGY FLAIR PROTOCOL), Mar 04, 2012 09:36:00 AM  INDICATION:  s/p embolization of AVM rule out hemorrhage in the sylvian fissure747.60 AVM (ar  COMPARISON: Head CT 03/04/2012, cerebral catheter angiography 03/03/2012  TECHNIQUE: Multiplanar, multi-sequence MR imaging of the entire brain was performed without contrast.  FINDINGS: .  Calvarium/skull base: No focal marrow replacing lesion suggestive of neoplasm. .  Orbits: Grossly unremarkable. .  Paranasal sinuses: Small mucous retention cyst versus polyp within the right maxillary sinus. .  Brain:  High T2 signal and restricted diffusion within the area of recently treated arteriovenous malformation within the right parietal lobe.  Single focus of high T2 FLAIR signal and diffusion restriction within the right sylvian fissure, which corresponds with the high density structure seen on head CT performed earlier the same day. No evidence of thalamic edema. No mass effect or hydrocephalus.  Gross patency of the major intracranial  arteries and dural sinuses, including the straight sinus.

## 2013-03-21 ENCOUNTER — Other Ambulatory Visit: Payer: Self-pay | Admitting: Radiation Therapy

## 2013-03-21 DIAGNOSIS — Q283 Other malformations of cerebral vessels: Secondary | ICD-10-CM

## 2013-03-22 ENCOUNTER — Other Ambulatory Visit: Payer: Self-pay | Admitting: Radiation Therapy

## 2013-03-22 ENCOUNTER — Other Ambulatory Visit (HOSPITAL_COMMUNITY): Payer: Self-pay | Admitting: Neurosurgery

## 2013-03-22 DIAGNOSIS — Q283 Other malformations of cerebral vessels: Secondary | ICD-10-CM

## 2013-03-22 DIAGNOSIS — Q2730 Arteriovenous malformation, site unspecified: Secondary | ICD-10-CM

## 2013-03-28 ENCOUNTER — Ambulatory Visit: Payer: No Typology Code available for payment source | Admitting: Radiation Oncology

## 2013-03-31 ENCOUNTER — Ambulatory Visit
Admission: RE | Admit: 2013-03-31 | Discharge: 2013-03-31 | Disposition: A | Discharge status: Home / Self Care | Payer: 59 | Source: Ambulatory Visit | Attending: Radiation Oncology | Admitting: Radiation Oncology

## 2013-03-31 DIAGNOSIS — Q283 Other malformations of cerebral vessels: Secondary | ICD-10-CM

## 2013-03-31 LAB — BUN AND CREATININE (CC13)
BUN: 16.4 mg/dL (ref 7.0–26.0)
Creatinine: 0.8 mg/dL (ref 0.6–1.1)

## 2013-03-31 MED ORDER — GADOBENATE DIMEGLUMINE 529 MG/ML IV SOLN
15.0000 mL | Freq: Once | INTRAVENOUS | Status: AC | PRN
  Administered 2013-03-31: 15 mL via INTRAVENOUS

## 2013-04-01 ENCOUNTER — Ambulatory Visit
Admission: RE | Admit: 2013-04-01 | Discharge: 2013-04-01 | Disposition: A | Discharge status: Home / Self Care | Payer: 59 | Source: Ambulatory Visit | Attending: Radiation Oncology | Admitting: Radiation Oncology

## 2013-04-01 ENCOUNTER — Ambulatory Visit
Admission: RE | Admit: 2013-04-01 | Discharge: 2013-04-01 | Disposition: A | Discharge status: Home / Self Care | Payer: No Typology Code available for payment source | Source: Ambulatory Visit | Attending: Radiation Oncology | Admitting: Radiation Oncology

## 2013-04-01 ENCOUNTER — Other Ambulatory Visit: Payer: Self-pay | Admitting: Radiology

## 2013-04-01 DIAGNOSIS — Q282 Arteriovenous malformation of cerebral vessels: Secondary | ICD-10-CM

## 2013-04-01 DIAGNOSIS — R51 Headache: Secondary | ICD-10-CM | POA: Insufficient documentation

## 2013-04-01 DIAGNOSIS — Z510 Encounter for antineoplastic radiation therapy: Secondary | ICD-10-CM | POA: Insufficient documentation

## 2013-04-01 DIAGNOSIS — Q283 Other malformations of cerebral vessels: Secondary | ICD-10-CM | POA: Insufficient documentation

## 2013-04-01 MED ORDER — LORAZEPAM 1 MG PO TABS
1.0000 mg | ORAL_TABLET | Freq: Once | ORAL | Status: AC
  Administered 2013-04-01: 1 mg via ORAL
  Filled 2013-04-01: qty 1

## 2013-04-01 MED ORDER — SODIUM CHLORIDE 0.9 % IJ SOLN
10.0000 mL | Freq: Once | INTRAMUSCULAR | Status: AC
  Administered 2013-04-01: 10 mL via INTRAVENOUS

## 2013-04-01 NOTE — Progress Notes (Signed)
IV catheter d/c'd from right antecube clean , dry and intact.site unremarkable.

## 2013-04-01 NOTE — Progress Notes (Signed)
Tearfully patient explains that she can't overcome the anxiety she has with "making the mask." Husband here to drive patient home. Vitals stable. Administered ativan 1 mg po as ordered by Dr. Tammi Klippel. Team will attempt in 15 minutes to continue.

## 2013-04-01 NOTE — Progress Notes (Signed)
Started right ac 22 gauge IV on the second attempt. Patient tolerated well. Excellent blood return obtained. Secured IV in place. Escorted patient to CT for simulation.

## 2013-04-04 ENCOUNTER — Encounter (HOSPITAL_COMMUNITY): Payer: Self-pay | Admitting: Pharmacy Technician

## 2013-04-08 ENCOUNTER — Other Ambulatory Visit (HOSPITAL_COMMUNITY): Payer: Self-pay | Admitting: Neurosurgery

## 2013-04-08 ENCOUNTER — Encounter (HOSPITAL_COMMUNITY): Payer: Self-pay

## 2013-04-08 ENCOUNTER — Ambulatory Visit (HOSPITAL_COMMUNITY)
Admission: RE | Admit: 2013-04-08 | Discharge: 2013-04-08 | Disposition: A | Discharge status: Home / Self Care | Payer: 59 | Source: Ambulatory Visit | Attending: Neurosurgery | Admitting: Neurosurgery

## 2013-04-08 DIAGNOSIS — IMO0001 Reserved for inherently not codable concepts without codable children: Secondary | ICD-10-CM | POA: Insufficient documentation

## 2013-04-08 DIAGNOSIS — Q2730 Arteriovenous malformation, site unspecified: Secondary | ICD-10-CM

## 2013-04-08 DIAGNOSIS — F3289 Other specified depressive episodes: Secondary | ICD-10-CM | POA: Insufficient documentation

## 2013-04-08 DIAGNOSIS — Q283 Other malformations of cerebral vessels: Secondary | ICD-10-CM | POA: Insufficient documentation

## 2013-04-08 DIAGNOSIS — Z87891 Personal history of nicotine dependence: Secondary | ICD-10-CM | POA: Insufficient documentation

## 2013-04-08 DIAGNOSIS — F329 Major depressive disorder, single episode, unspecified: Secondary | ICD-10-CM | POA: Insufficient documentation

## 2013-04-08 DIAGNOSIS — R569 Unspecified convulsions: Secondary | ICD-10-CM | POA: Insufficient documentation

## 2013-04-08 DIAGNOSIS — I10 Essential (primary) hypertension: Secondary | ICD-10-CM | POA: Insufficient documentation

## 2013-04-08 DIAGNOSIS — F411 Generalized anxiety disorder: Secondary | ICD-10-CM | POA: Insufficient documentation

## 2013-04-08 LAB — BASIC METABOLIC PANEL
BUN: 18 mg/dL (ref 6–23)
CHLORIDE: 104 meq/L (ref 96–112)
CO2: 23 meq/L (ref 19–32)
Calcium: 9 mg/dL (ref 8.4–10.5)
Creatinine, Ser: 0.81 mg/dL (ref 0.50–1.10)
GFR calc Af Amer: >90 mL/min (ref >90)
GFR calc non Af Amer: >90 mL/min (ref >90)
Glucose, Bld: 90 mg/dL (ref 70–99)
Potassium: 4.7 mEq/L (ref 3.7–5.3)
SODIUM: 138 meq/L (ref 137–147)

## 2013-04-08 LAB — CBC
HEMATOCRIT: 38 % (ref 36.0–46.0)
HEMOGLOBIN: 13.2 g/dL (ref 12.0–15.0)
MCH: 31.7 pg (ref 26.0–34.0)
MCHC: 34.7 g/dL (ref 30.0–36.0)
MCV: 91.3 fL (ref 78.0–100.0)
Platelets: 190 10*3/uL (ref 150–400)
RBC: 4.16 MIL/uL (ref 3.87–5.11)
RDW: 12.5 % (ref 11.5–15.5)
WBC: 5.7 10*3/uL (ref 4.0–10.5)

## 2013-04-08 LAB — APTT: APTT: 33 s (ref 24–37)

## 2013-04-08 LAB — PROTIME-INR
INR: 1.06 (ref 0.00–1.49)
Prothrombin Time: 13.6 seconds (ref 11.6–15.2)

## 2013-04-08 MED ORDER — FENTANYL CITRATE 0.05 MG/ML IJ SOLN
INTRAMUSCULAR | Status: AC | PRN
  Administered 2013-04-08 (×2): 25 ug via INTRAVENOUS

## 2013-04-08 MED ORDER — LORAZEPAM 2 MG/ML IJ SOLN
INTRAMUSCULAR | Status: AC | PRN
  Administered 2013-04-08: 1 mg via INTRAVENOUS

## 2013-04-08 MED ORDER — SODIUM CHLORIDE 0.9 % IV SOLN: INTRAVENOUS | Status: AC

## 2013-04-08 MED ORDER — FENTANYL CITRATE 0.05 MG/ML IJ SOLN
INTRAMUSCULAR | Status: AC
  Filled 2013-04-08: qty 2

## 2013-04-08 MED ORDER — SODIUM CHLORIDE 0.9 % IV BOLUS (SEPSIS)
INTRAVENOUS | Status: AC | PRN
  Administered 2013-04-08: 250 mL via INTRAVENOUS

## 2013-04-08 MED ORDER — MIDAZOLAM HCL 2 MG/2ML IJ SOLN
INTRAMUSCULAR | Status: AC
  Filled 2013-04-08: qty 2

## 2013-04-08 MED ORDER — HEPARIN SOD (PORK) LOCK FLUSH 100 UNIT/ML IV SOLN
INTRAVENOUS | Status: AC | PRN
  Administered 2013-04-08: 1000 [IU] via INTRAVENOUS

## 2013-04-08 MED ORDER — MIDAZOLAM HCL 2 MG/2ML IJ SOLN
INTRAMUSCULAR | Status: AC | PRN
  Administered 2013-04-08: 1 mg via INTRAVENOUS

## 2013-04-08 MED ORDER — LORAZEPAM 2 MG/ML IJ SOLN
INTRAMUSCULAR | Status: AC
  Filled 2013-04-08: qty 1

## 2013-04-08 MED ORDER — SODIUM CHLORIDE 0.9 % IV SOLN: Freq: Once | INTRAVENOUS | Status: DC

## 2013-04-08 MED ORDER — KETOROLAC TROMETHAMINE 30 MG/ML IJ SOLN
30.0000 mg | Freq: Once | INTRAMUSCULAR | Status: AC
  Administered 2013-04-08: 30 mg via INTRAVENOUS
  Filled 2013-04-08: qty 1

## 2013-04-08 NOTE — Progress Notes (Signed)
Pt c/o headache and left upper thigh pain. Paged Jannifer Franklin, PA. Order for pain medication received.

## 2013-04-08 NOTE — Procedures (Signed)
S/P Rt vert and RT common carotid arteriogram  RT CFA approach. Findings. RT parietal AVM fed by  Post parietal brs of RT MCA sup division ,and RT PCA occipital branch.

## 2013-04-08 NOTE — Discharge Instructions (Signed)
Angiography, Care After Refer to this sheet in the next few weeks. These instructions provide you with information on caring for yourself after your procedure. Your health care provider may also give you more specific instructions. Your treatment has been planned according to current medical practices, but problems sometimes occur. Call your health care provider if you have any problems or questions after your procedure.  WHAT TO EXPECT AFTER THE PROCEDURE After your procedure, it is typical to have the following sensations:  Minor discomfort or tenderness and a small bump at the catheter insertion site. The bump should usually decrease in size and tenderness within 1 to 2 weeks.  Any bruising will usually fade within 2 to 4 weeks. HOME CARE INSTRUCTIONS   You may need to keep taking blood thinners if they were prescribed for you. Only take over-the-counter or prescription medicines for pain, fever, or discomfort as directed by your health care provider.  Do not apply powder or lotion to the site.  Do not sit in a bathtub, swimming pool, or whirlpool for 5 to 7 days.  You may shower 24 hours after the procedure. Remove the bandage (dressing) and gently wash the site with plain soap and water. Gently pat the site dry.  Inspect the site at least twice daily.  Limit your activity for the first 24 hours. Do not bend, squat, or lift anything over 10 lb (9 kg) or as directed by your health care provider.  Do not drive home if you are discharged the day of the procedure. Have someone else drive you. Follow instructions about when you can drive or return to work. SEEK MEDICAL CARE IF:  You get lightheaded when standing up.  You have drainage (other than a small amount of blood on the dressing).  You have chills.  You have a fever.  You have redness, warmth, swelling, or pain at the insertion site. SEEK IMMEDIATE MEDICAL CARE IF:   You develop chest pain or shortness of breath, feel faint,  or pass out.  You have bleeding, swelling larger than a walnut, or drainage from the catheter insertion site.  You develop pain, discoloration, coldness, or severe bruising in the leg or arm that held the catheter.  You have heavy bleeding from the site. If this happens, hold pressure on the site and call 911. MAKE SURE YOU:  Understand these instructions.  Will watch your condition.  Will get help right away if you are not doing well or get worse. Document Released: 09/26/2004 Document Revised: 11/10/2012 Document Reviewed: 08/02/2012 [facility] Patient Information 2014 [place].

## 2013-04-08 NOTE — H&P (Signed)
Katie [name] is an 38 y.o. female.   Chief Complaint: Pt with known Rt parietal Arteriovenous malformation Original embolization performed at [facility] 02/2012 Still with headaches and some flow noted on 07/2012 arteriogram with Dr Katie [name] Pt now scheduled for radiation treatment with Dr Katie [name] 04/14/13 Today is scheduled for cerebral arteriogram with "mask" in preparation  HPI: Brain AVM; HTN; Sz  Past Medical History  Diagnosis Date  . Fibromyalgia   . Migraine   . Hypertension   . Depression   . Anxiety   . AVM (arteriovenous malformation)   . Cerebral aneurysm   . Seizures     Past Surgical History  Procedure Laterality Date  . Tubal ligation    . Cerebral embolization  dec 2013    No family history on file. Social History:  reports that she has quit smoking. She does not have any smokeless tobacco history on file. She reports that she does not drink alcohol or use illicit drugs.  Allergies: No Known Allergies   (Not in a hospital admission)  Results for orders placed during the hospital encounter of 04/08/13 (from the past 48 hour(s))  CBC     Status: None   Collection Time    04/08/13  7:01 AM      Result Value Range   WBC 5.7  4.0 - 10.5 K/uL   RBC 4.16  3.87 - 5.11 MIL/uL   Hemoglobin 13.2  12.0 - 15.0 g/dL   HCT 38.0  36.0 - 46.0 %   MCV 91.3  78.0 - 100.0 fL   MCH 31.7  26.0 - 34.0 pg   MCHC 34.7  30.0 - 36.0 g/dL   RDW 12.5  11.5 - 15.5 %   Platelets 190  150 - 400 K/uL   No results found.  Review of Systems  Constitutional: Negative for fever and weight loss.  Eyes: Negative for blurred vision and double vision.  Respiratory: Negative for shortness of breath.   Cardiovascular: Negative for chest pain.  Gastrointestinal: Negative for nausea, vomiting and abdominal pain.  Musculoskeletal: Negative for neck pain.  Neurological: Positive for headaches. Negative for dizziness and weakness.  Psychiatric/Behavioral: Negative for substance  abuse. The patient is nervous/anxious.     Blood pressure 106/62, pulse 72, temperature 98.3 F (36.8 C), temperature source Oral, resp. rate 18, height 5\' 1"  (1.549 m), weight 157 lb (71.215 kg), SpO2 100.00%. Physical Exam  Constitutional: She is oriented to person, place, and time. She appears well-developed and well-nourished.  Eyes: EOM are normal.  Neck: Neck supple.  Cardiovascular: Normal rate, regular rhythm and normal heart sounds.   No murmur heard. Respiratory: Effort normal and breath sounds normal. She has no wheezes.  GI: Soft. Bowel sounds are normal. There is no tenderness.  Musculoskeletal: Normal range of motion.  Neurological: She is alert and oriented to person, place, and time. Coordination normal.  Skin: Skin is warm and dry.  Psychiatric: She has a normal mood and affect. Her behavior is normal. Judgment and thought content normal.     Assessment/Plan Known Rt parietal AVM Embolized 02/2012 at [facility] headaches and flow per arteriogram 07/2012 with Dr Katie [name] Pt now scheduled for radiation treatment to AVM with Dr Katie [name] 04/14/13 Scheduled today for cerebral arteriogram with "mask" in preparation Pt aware of procedure benefits and risks and agreeable to proceed Consent signed and in chart   Katie [name] A 04/08/2013, 7:32 AM

## 2013-04-13 NOTE — Progress Notes (Signed)
Caring for Yourself at Home After an Angiogram and Stereotactic  Radiosurgery for an AVM    After the procedures, your nurse will review these instructions with you prior to discharge home.  What Can I Expect After My Procedures?  . Resume a regular diet. . Drink plenty of fluids for the first 24 hours following your procedures, unless otherwise directed by your doctor. These extra fluids will help flush the contrast dye from your body. No alcoholic or caffeinated beverages, please! . Rest and relax! No heavy lifting, exercise, bending, lifting heavy objects, climbing stairs, showering, operating heavy machinery or driving for 48 hours following the procedures. . It is necessary to lie still with your head flat for 4 to 6 hours following an angiogram to prevent lightheadedness and dizziness. Gradually you will be allowed to get out of bed with assistance but, you are advised to take it slow.  . Keep your affected leg straight when lying or sitting for 48 hours following the procedures. Avoid excessive movement. It is important not to stress the incision/puncture site. . It is normal to feel slight tenderness and see bruising at the site of the injection following an angiogram. Observe the injection/puncture site for drainage, bleeding or redness. . Keep the adhesive bandage on the puncture/injection site for 24 hours following the procedure then, you may remove it.  . For patients with diabetes taking Metformin you may need to stop the medication for 24-48 hours following the procedures so be sure and check with your doctor.  What Symptoms Do I Watch for When I Go Home?   Complications following these procedures are very uncommon. However, if you have any bleeding from the puncture site, lie down flat, apply pressure over site and hold pressure for 15 minutes.  Call Your Doctor: . For bleeding that continues after you have applied pressure for 15 minutes. . You develop a golf ball size lump,  bruising, redness, warmth or loss of feeling over the puncture site. . You feel lightheaded, dizzy, clammy, faint, facial weakness, numbness in your legs, slurred speech or vision problems.  . You experience mental confusion, swallowing or talking difficulty. . An allergic reaction such as hives, itching, rapid heartbeat, dizziness, chest pain or shortness of breath.   Final Note:  It is important to understand that radiation effects are slow to happen. Typically it takes up to two years for an AVM treated with radiotherapy to close. Until the AVM has closed completely radiation treatments do not protect them from bleeding.   

## 2013-04-13 NOTE — Progress Notes (Signed)
  Radiation Oncology         [phone]) [phone] ________________________________  Stereotactic Treatment Procedure Note  Name: Katie [name]  MRN: [id number]  Date: 04/14/2013  DOB: [birth year]  SPECIAL TREATMENT PROCEDURE  3D TREATMENT PLANNING AND DOSIMETRY:  The patient's radiation plan was reviewed and approved by neurosurgery and radiation oncology prior to treatment.  It showed 3-dimensional radiation distributions overlaid onto the planning CT/MRI image set.  The [facility] for the target structures as well as the organs at risk were reviewed. The documentation of the 3D plan and dosimetry are filed in the radiation oncology EMR.  NARRATIVE:  Katie [name] was brought to the TrueBeam stereotactic radiation treatment machine and placed supine on the CT couch. The head frame was applied, and the patient was set up for stereotactic radiosurgery.  Neurosurgery was present for the set-up and delivery  SIMULATION VERIFICATION:  In the couch zero-angle position, the patient underwent Exactrac imaging using the Brainlab system with orthogonal KV images.  These were carefully aligned and repeated to confirm treatment position for each of the isocenters.  The Exactrac snap film verification was repeated at each couch angle.  SPECIAL TREATMENT PROCEDURE: Katie [name] received stereotactic radiosurgery to the following targets: Right frontal AVM target was treated using 5 Dynamic Conformal Arcs to a prescription dose of 18 Gy.  ExacTrac registration was performed for each couch angle.  The 85.5% isodose line was prescribed.  STEREOTACTIC TREATMENT MANAGEMENT:  Following delivery, the patient was transported to nursing in stable condition and monitored for possible acute effects.  Vital signs were recorded BP 115/67  Pulse 76  Temp(Src) 98.5 F (36.9 C) (Oral)  Resp 20  LMP 03/25/2013. The patient tolerated treatment without significant acute effects, and was discharged to home in stable condition.    PLAN:  Follow-up in one month.  ________________________________  Sheral Apley. Tammi Klippel, M.D.

## 2013-04-14 ENCOUNTER — Ambulatory Visit
Admission: RE | Admit: 2013-04-14 | Discharge: 2013-04-14 | Disposition: A | Discharge status: Home / Self Care | Payer: 59 | Source: Ambulatory Visit | Attending: Radiation Oncology | Admitting: Radiation Oncology

## 2013-04-14 ENCOUNTER — Encounter: Payer: Self-pay | Admitting: Radiation Oncology

## 2013-04-14 ENCOUNTER — Other Ambulatory Visit: Payer: Self-pay | Admitting: Radiation Therapy

## 2013-04-14 ENCOUNTER — Other Ambulatory Visit: Payer: Self-pay | Admitting: Pharmacist

## 2013-04-14 VITALS — BP 108/67 | HR 83 | Temp 98.6°F | Wt 160.3 lb

## 2013-04-14 VITALS — BP 101/54 | HR 71 | Temp 98.4°F | Resp 20

## 2013-04-14 DIAGNOSIS — Q2730 Arteriovenous malformation, site unspecified: Secondary | ICD-10-CM

## 2013-04-14 DIAGNOSIS — Q282 Arteriovenous malformation of cerebral vessels: Secondary | ICD-10-CM

## 2013-04-14 DIAGNOSIS — Q283 Other malformations of cerebral vessels: Secondary | ICD-10-CM

## 2013-04-14 MED ORDER — METHYLPREDNISOLONE (PAK) 4 MG PO TABS: ORAL_TABLET | ORAL | Status: DC

## 2013-04-14 MED ORDER — LORAZEPAM 1 MG PO TABS
1.0000 mg | ORAL_TABLET | Freq: Once | ORAL | Status: AC
  Administered 2013-04-14: 1 mg via ORAL
  Filled 2013-04-14: qty 1

## 2013-04-14 MED ORDER — SODIUM CHLORIDE 0.9 % IV SOLN
Freq: Once | INTRAVENOUS | Status: AC
  Administered 2013-04-14: 14:00:00 via INTRAVENOUS
  Filled 2013-04-14: qty 50

## 2013-04-14 MED ORDER — SODIUM CHLORIDE 0.9 % IJ SOLN
10.0000 mL | Freq: Once | INTRAMUSCULAR | Status: AC
  Administered 2013-04-14: 10 mL via INTRAVENOUS

## 2013-04-14 NOTE — Progress Notes (Signed)
Patient for [facility] for AVm.Flush feeling is clearing.Discharge instructions provided verbally and orally to patient and husband. Push fluids, no heavy lifting and knows no driving for 48 hours.Knows to call if any symptoms out of the ordinary.To pick up medrol dose pack today and follow tapering instructions.Iv cath d/c'd intact from right hand.site unremarkable.Escorted patient and husband to elevator and at that point Bertrand walked patient to first floor.

## 2013-04-14 NOTE — Op Note (Signed)
Name: Katie [name] MRN: [id number]  Date: 04/14/2013 DOB: [birth year]  SPECIAL TREATMENT PROCEDURE  3D TREATMENT PLANNING AND DOSIMETRY: The patient's radiation plan was reviewed and approved by neurosurgery and radiation oncology prior to treatment. It showed 3-dimensional radiation distributions overlaid onto the planning CT/MRI image set. [facility] for the target structures as well as the organs at risk were reviewed. The documentation of the 3D plan and dosimetry are filed in the radiation oncology EMR.  NARRATIVE: Katie [name] was brought to the TrueBeam stereotactic radiation treatment machine and placed supine on the CT couch. The head frame was applied, and the patient was set up for stereotactic radiosurgery. Neurosurgery was present for the set-up and delivery  SIMULATION VERIFICATION: In the couch zero-angle position, the patient underwent Exactrac imaging using the Brainlab system with orthogonal KV images. These were carefully aligned and repeated to confirm treatment position for each of the isocenters. The Exactrac snap film verification was repeated at each couch angle.  SPECIAL TREATMENT PROCEDURE: Katie [name] received stereotactic radiosurgery to the following targets:  Right frontal AVM target was treated using 5 Dynamic Conformal Arcs to a prescription dose of 18 Gy. ExacTrac registration was performed for each couch angle. The 85.5% isodose line was prescribed.  STEREOTACTIC TREATMENT MANAGEMENT: Following delivery, the patient was transported to nursing in stable condition and monitored for possible acute effects. Vital signs were recorded BP 115/67  Pulse 76  Temp(Src) 98.5 F (36.9 C) (Oral)  Resp 20  LMP 03/25/2013. The patient tolerated treatment without significant acute effects, and was discharged to home in stable condition.

## 2013-04-14 NOTE — Addendum Note (Signed)
Encounter addended by: Arlyss Repress, RN on: 04/14/2013  3:16 PM<BR>     Documentation filed: Inpatient Document Flowsheet

## 2013-04-14 NOTE — Addendum Note (Signed)
Encounter addended by: Arlyss Repress, RN on: 04/14/2013  3:24 PM<BR>     Documentation filed: Notes Section

## 2013-04-14 NOTE — Addendum Note (Signed)
Encounter addended by: Lora Paula, MD on: 04/14/2013  2:57 PM<BR>     Documentation filed: Orders

## 2013-04-14 NOTE — Addendum Note (Signed)
Encounter addended by: Winfield Cunas, MD on: 04/14/2013  4:11 PM<BR>     Documentation filed: Clinical Notes

## 2013-04-14 NOTE — Progress Notes (Signed)
Pt reclining in rm 1 s/p SRS for AVM. Pt tearful; she told S Boyles the "flushed feeling" surprised her. Pt received IV Decadron prior to treatment. Informed pt this feeling would go away as medication left her system. Pt c/o mild pain "at back of my head". She denies nausea, dizziness, blurred vision. She has NS infusing into right hand, site w/o redness, swelling.

## 2013-04-14 NOTE — Addendum Note (Signed)
Encounter addended by: Arlyss Repress, RN on: 04/14/2013  5:43 PM<BR>     Documentation filed: Inpatient MAR

## 2013-04-15 NOTE — Progress Notes (Signed)
  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 ExacTrac registration was performed for each couch angle. The 85.5% isodose line was prescribed.  Beams/energy:   6 MV X-rays were delivered in the FFF mode.  Narrative: The patient tolerated radiation treatment relatively well.  She got 10 mg IV dexamethasone and will complete a medrol dose pack.  Plan: The patient has completed radiation treatment. The patient will return to radiation oncology clinic for routine followup in one month. I advised them to call or return sooner if they have any questions or concerns related to their recovery or treatment. ________________________________  Sheral Apley. Tammi Klippel, M.D.

## 2013-04-25 NOTE — Progress Notes (Signed)
  Radiation Oncology         [phone] ________________________________  Name: Katie [name]  MRN: [id number]  Date: 04/01/2013  DOB: 20-Sep-1975  SIMULATION AND TREATMENT PLANNING NOTE  DIAGNOSIS:  Right frontal AVM  NARRATIVE:  The patient was brought to the [facility].  Identity was confirmed.  All relevant records and images related to the planned course of therapy were reviewed.  The patient freely provided informed written consent to proceed with treatment after reviewing the details related to the planned course of therapy. The consent form was witnessed and verified by the simulation staff. Intravenous access was established for contrast administration. Then, the patient was set-up in a stable reproducible supine position for radiation therapy.  A relocatable thermoplastic stereotactic head frame was fabricated for precise immobilization.  CT images were obtained.  Surface markings were placed.  The CT images were loaded into the planning software and fused with the patient's targeting MRI scan.  Then the initial target and avoidance structures were contoured.  The radiation prescription was entered and confirmed.  I have requested 3D planning  I have requested a DVH of the following structures: Brain stem, brain, left eye, right I, lenses, optic chiasm, target volumes, uninvolved brain, and normal tissue.    The patient will proceed to biplanar angiography to further delineate and confirm the location and configuration of the AVM nidus.  PLAN:  The patient will receive 18 Gy in one fraction.  ________________________________  Sheral Apley Tammi Klippel, M.D.

## 2013-05-09 ENCOUNTER — Ambulatory Visit: Payer: 59 | Admitting: Neurology

## 2013-05-09 ENCOUNTER — Telehealth: Payer: Self-pay | Admitting: Neurology

## 2013-05-09 NOTE — Telephone Encounter (Signed)
Patient was a no show for 05/09/2013 new patient visit.

## 2013-05-23 ENCOUNTER — Other Ambulatory Visit: Payer: Self-pay | Admitting: Radiation Therapy

## 2013-05-23 DIAGNOSIS — Q282 Arteriovenous malformation of cerebral vessels: Secondary | ICD-10-CM

## 2013-05-24 ENCOUNTER — Ambulatory Visit
Admission: RE | Admit: 2013-05-24 | Discharge: 2013-05-24 | Disposition: A | Discharge status: Home / Self Care | Payer: 59 | Source: Ambulatory Visit | Attending: Radiation Oncology | Admitting: Radiation Oncology

## 2013-05-24 DIAGNOSIS — Q282 Arteriovenous malformation of cerebral vessels: Secondary | ICD-10-CM

## 2013-05-24 DIAGNOSIS — Q283 Other malformations of cerebral vessels: Secondary | ICD-10-CM | POA: Insufficient documentation

## 2013-05-24 LAB — BUN AND CREATININE (CC13)
BUN: 12.9 mg/dL (ref 7.0–26.0)
Creatinine: 0.7 mg/dL (ref 0.6–1.1)

## 2013-05-26 ENCOUNTER — Ambulatory Visit
Admission: RE | Admit: 2013-05-26 | Discharge: 2013-05-26 | Disposition: A | Discharge status: Home / Self Care | Payer: 59 | Source: Ambulatory Visit | Attending: Radiation Oncology | Admitting: Radiation Oncology

## 2013-05-26 DIAGNOSIS — Q283 Other malformations of cerebral vessels: Secondary | ICD-10-CM

## 2013-05-26 MED ORDER — GADOBENATE DIMEGLUMINE 529 MG/ML IV SOLN
15.0000 mL | Freq: Once | INTRAVENOUS | Status: AC | PRN
  Administered 2013-05-26: 15 mL via INTRAVENOUS

## 2013-05-27 ENCOUNTER — Telehealth: Payer: Self-pay | Admitting: Neurology

## 2013-05-27 NOTE — Telephone Encounter (Signed)
Called patient to R/S her missed appointment (05/09/13 @ 8:45 am), per patient she is waiting on a new referral from her doctor, that will be approved by her insurance, patient instructed that once the new referral is received she will receive a call to schedule appointment.

## 2013-05-30 ENCOUNTER — Encounter: Payer: Self-pay | Admitting: Radiation Oncology

## 2013-05-30 ENCOUNTER — Ambulatory Visit
Admission: RE | Admit: 2013-05-30 | Discharge: 2013-05-30 | Disposition: A | Discharge status: Home / Self Care | Payer: 59 | Source: Ambulatory Visit | Attending: Radiation Oncology | Admitting: Radiation Oncology

## 2013-05-30 VITALS — BP 127/89 | HR 77 | Temp 98.0°F | Resp 16 | Wt 158.9 lb

## 2013-05-30 DIAGNOSIS — T66XXXA Radiation sickness, unspecified, initial encounter: Secondary | ICD-10-CM

## 2013-05-30 DIAGNOSIS — Q282 Arteriovenous malformation of cerebral vessels: Secondary | ICD-10-CM

## 2013-05-30 MED ORDER — PROCHLORPERAZINE MALEATE 10 MG PO TABS: 10.0000 mg | ORAL_TABLET | Freq: Four times a day (QID) | ORAL | Status: DC | PRN

## 2013-05-30 NOTE — Progress Notes (Signed)
Reports daily right parietal headaches that migrate to the frontal area. Reports the back of her neck has been numb since yesterday. Reports constant ringing in her ears and muffled hearing x2 weeks. Reports persistent nausea but, denies emesis. Reports she is back to working full time and very fatigued. Reports she felt light headed yesterday. Reports steady gait noted. Reports she is sleeping well. Reports headache 8 on a scale of 0-10. Reports she is going to see a PCP tomorrow for an initial consult with Dr. Justin Mend at [facility] in [place] to obtain referral to a neurologist.

## 2013-05-30 NOTE — Progress Notes (Signed)
Radiation Oncology         [phone] ________________________________  Name: Katie [name] MRN: [id number]  Date: 05/30/2013  DOB: [birth year]  Follow-Up Visit Note  CC: Angelica Chessman, MD  Angelica Chessman, MD  Diagnosis:   38 year old woman s/p SRS for Right frontal Arteriovenous Malformation  04/14/2013 to 18 Gy  Interval Since Last Radiation:  6  weeks  Narrative:  The patient returns today for routine follow-up.  The  patient was suffering with  ringing in her ears  daily for the first 2 weeks following treatment. This has improved and occurs on an intermittent basis now. She reports occasional headache as well as some nausea.                          ALLERGIES:  has No Known Allergies.  Meds: Current Outpatient Prescriptions  Medication Sig Dispense Refill  . clonazePAM (KLONOPIN) 0.5 MG tablet Take 1 tablet (0.5 mg total) by mouth 2 (two) times daily as needed for anxiety. For anxiety  30 tablet  0  . GARCINIA CAMBOGIA-CHROMIUM PO Take 1 tablet by mouth 4 (four) times daily - after meals and at bedtime.      . levETIRAcetam (KEPPRA) 500 MG tablet Take 500 mg by mouth 3 (three) times daily.      Marland Kitchen lisinopril (PRINIVIL,ZESTRIL) 20 MG tablet Take 20 mg by mouth every morning.      Marland Kitchen HYDROcodone-acetaminophen (NORCO/VICODIN) 5-325 MG per tablet Take 1 tablet by mouth every 6 (six) hours as needed for moderate pain.       . methylPREDNIsolone (MEDROL DOSPACK) 4 MG tablet follow package directions  21 tablet  0  . prochlorperazine (COMPAZINE) 10 MG tablet Take 1 tablet (10 mg total) by mouth every 6 (six) hours as needed for nausea or vomiting.  30 tablet  0  . [DISCONTINUED] esomeprazole (NEXIUM) 40 MG capsule Take 40 mg by mouth daily before breakfast. Dispense as written       No current facility-administered medications for this encounter.    Physical Findings: The patient is in no acute distress. Patient is alert and oriented.  weight is 158 lb 14.4 oz (72.077 kg). Her  oral temperature is 98 F (36.7 C). Her blood pressure is 127/89 and her pulse is 77. Her respiration is 16 and oxygen saturation is 100%. .  No significant changes.  Lab Findings: Lab Results  Component Value Date   WBC 5.7 04/08/2013   HGB 13.2 04/08/2013   HCT 38.0 04/08/2013   MCV 91.3 04/08/2013   PLT 190 04/08/2013    @LASTCHEM @  Radiographic Findings: Mr Jeri Cos AC Contrast  05/27/2013   CLINICAL DATA:  AVM.  EXAM: MRI HEAD WITHOUT AND WITH CONTRAST  TECHNIQUE: Multiplanar, multiecho pulse sequences of the brain and surrounding structures were obtained without and with intravenous contrast.  CONTRAST:  38mL MULTIHANCE GADOBENATE DIMEGLUMINE 529 MG/ML IV SOLN  COMPARISON:  MRI brain without contrast 03/31/2013. Cerebral angiogram 04/08/2013.  FINDINGS: There is no significant interval change in the appearance of a right parietal arteriovenous malformation. Draining veins to the vein of Galen as well as superficial draining veins are stable. There is no enhancement or evidence for hemorrhage. The dural sinuses are patent and stable. Encephalomalacia associated with the lesion is unchanged.  Ventricles are of normal size. No significant extra-axial fluid collection is present.  Flow is present in the major intracranial arteries. The globes and orbits are intact. A polyp or  mucous retention cyst is present along the medial aspect of the right maxillary sinus. The remaining paranasal sinuses and the mastoid air cells are clear.  IMPRESSION: 1. Stable appearance of right parietal arteriovenous malformation with deep and superficial drainage. 2. No acute intracranial abnormality.   Electronically Signed   By: Lawrence Santiago M.D.   On: 05/27/2013 08:05   Impression:  The patient is recovering from the effects of radiation.  Plan:   followup with neurosurgery in 3 months and return to radiation oncology in 6 months. No further planned imaging until repeat cerebral angiography 24-30 months status post  SRS   _____________________________________  Sheral Apley. Tammi Klippel, M.D.

## 2013-06-03 ENCOUNTER — Encounter: Payer: Self-pay | Admitting: Neurology

## 2013-06-03 ENCOUNTER — Ambulatory Visit (INDEPENDENT_AMBULATORY_CARE_PROVIDER_SITE_OTHER): Payer: 59 | Admitting: Neurology

## 2013-06-03 VITALS — BP 103/73 | HR 88 | Ht 61.0 in | Wt 156.0 lb

## 2013-06-03 DIAGNOSIS — R51 Headache: Secondary | ICD-10-CM

## 2013-06-03 DIAGNOSIS — R519 Headache, unspecified: Secondary | ICD-10-CM | POA: Insufficient documentation

## 2013-06-03 DIAGNOSIS — F411 Generalized anxiety disorder: Secondary | ICD-10-CM

## 2013-06-03 DIAGNOSIS — Q282 Arteriovenous malformation of cerebral vessels: Secondary | ICD-10-CM

## 2013-06-03 DIAGNOSIS — R569 Unspecified convulsions: Secondary | ICD-10-CM | POA: Insufficient documentation

## 2013-06-03 DIAGNOSIS — I671 Cerebral aneurysm, nonruptured: Secondary | ICD-10-CM

## 2013-06-03 DIAGNOSIS — Q283 Other malformations of cerebral vessels: Secondary | ICD-10-CM

## 2013-06-03 HISTORY — DX: Headache, unspecified: R51.9

## 2013-06-03 MED ORDER — HYDROCODONE-ACETAMINOPHEN 5-325 MG PO TABS: 1.0000 | ORAL_TABLET | Freq: Three times a day (TID) | ORAL | Status: DC | PRN

## 2013-06-03 MED ORDER — TOPIRAMATE 25 MG PO TABS: ORAL_TABLET | ORAL | Status: DC

## 2013-06-03 MED ORDER — TOPIRAMATE 100 MG PO TABS: 100.0000 mg | ORAL_TABLET | Freq: Two times a day (BID) | ORAL | Status: DC

## 2013-06-03 NOTE — Progress Notes (Signed)
PATIENT: Katie [name] DOB: 1976/02/11  HISTORICAL  Katie [name] is a 38 years old right-handed Caucasian female, is referred by her primary care physician Katie [name] from [place] practice for evaluation of frequent headaches   She had a gradual onset of headache since 2012, in November 18 2011, she sudden loss of consciousness, suffered a seizure,  was taken to the most distal hospital, CAT scan has demonstrated, right parietal AVM malformation, bleeding into her right lateral ventricle she was under the care neurosurgeon Dr. Christella [name],   Initially she was referred to [facility] endovascular embolization, super-selective WADA which she passed and subsequently had transarterial embolization in November 2013,  Postsurgically, she continued to have intractable headaches, sometimes presented to the emergency room, she was given prescription of hydrocodone 5/325 mg 60 tablets each month,  Repeat angiogram in January 20/15 demonstrate patent right AVM, moderate fast flow subcortical deep nidus of an arteriovenous malformation involving the right parietal/occipital subcortical region, supplied by the distal posterior parietal branches of the superior division of the right middle cerebral artery, the distal  pericallosal artery on the right and also from the occipital branches of the right posterior cerebral artery. Venous drainage is inferiorly and posteriorly via a prominent right internal cerebral vein associated with a moderate-sized venous aneurysm proximally, with subsequent drainage into the vein of Galen  and the straight sinus. No arterial aneurysms are identified.   She underwent radiation therapy at [facility] by Dr. Tammi [name] in January 2015: 5 Dynamic Conformal Arcs to a prescription dose of 18 Gy. ExacTrac registration was performed for each couch angle. The 85.5% isodose line was prescribed.   Most recent MRI of brain in March 2015 there is no significant interval change in the  appearance of a right parietal arteriovenous malformation. Draining veins to the vein of Galen as well as superficial draining veins are stable. There is no enhancement or evidence for hemorrhage. The dural sinuses are patent  and stable. Encephalomalacia associated with the lesion is unchanged.  Ventricles are of normal size. No significant extra-axial fluid collection is present.  She now complains constant 3 out of 10 headaches, 3-4 times each month, it would be exacerbated to a much severe 8 out of 10 headaches, she has to take hydrocodone, never tried preventive medications in the past, she works full-time as a Educational psychologist, has mild left arm, leg heaviness, numbness, no visual change,  She also continued to have spells self transient staring, confusion, suggestive of complex partial seizure last one was 2 weeks ago,  She is currently taking Keppra 750 mg twice a day  REVIEW OF SYSTEMS: Full 14 system review of systems performed and notable only for fatigue, restless leg, walking difficulty, dizziness, headaches  ALLERGIES: No Known Allergies  HOME MEDICATIONS: Current Outpatient Prescriptions on File Prior to Visit  Medication Sig Dispense Refill  . clonazePAM (KLONOPIN) 0.5 MG tablet Take 1 tablet (0.5 mg total) by mouth 2 (two) times daily as needed for anxiety. For anxiety  30 tablet  0  . GARCINIA CAMBOGIA-CHROMIUM PO Take 1 tablet by mouth 4 (four) times daily - after meals and at bedtime.      Marland Kitchen HYDROcodone-acetaminophen (NORCO/VICODIN) 5-325 MG per tablet Take 1 tablet by mouth every 6 (six) hours as needed for moderate pain.       Marland Kitchen levETIRAcetam (KEPPRA) 500 MG tablet Take 500 mg by mouth 3 (three) times daily.      Marland Kitchen lisinopril (PRINIVIL,ZESTRIL) 20 MG tablet Take 20 mg  by mouth every morning.      . prochlorperazine (COMPAZINE) 10 MG tablet Take 1 tablet (10 mg total) by mouth every 6 (six) hours as needed for nausea or vomiting.  30 tablet  0  . [DISCONTINUED] esomeprazole  (NEXIUM) 40 MG capsule Take 40 mg by mouth daily before breakfast. Dispense as written         PAST MEDICAL HISTORY: Past Medical History  Diagnosis Date  . Fibromyalgia   . Migraine   . Hypertension   . Depression   . Anxiety   . AVM (arteriovenous malformation)   . Cerebral aneurysm   . Seizures     PAST SURGICAL HISTORY: Past Surgical History  Procedure Laterality Date  . Tubal ligation    . Cerebral embolization  dec 2013  . Wisdom tooth extraction  2001  . Radiation brain      FAMILY HISTORY: Family History  Problem Relation Age of Onset  . Bipolar disorder Mother   . Thyroid disease Maternal Grandmother   . Glaucoma Maternal Grandmother   . Aneurysm Father     SOCIAL HISTORY:  History   Social History  . Marital Status: Married    Spouse Name: Katie [name]    Number of Children: 2  . Years of Education: college   Occupational History  . 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 Smoking status: Former Research scientist (life sciences)  . Smokeless tobacco: Never Used     Comment: Quit 2007  . Alcohol Use: 0.6 oz/week    1 Cans of beer per week     Comment: occasional  . Drug Use: No  . Sexual Activity: Yes    Birth Control/ Protection: None   Other Topics Concern  . Not on file   Social History Narrative   Patient lives at home with her husband Katie [name])    Patient works full time at [employer]   Both handed   Caffeine two per week          PHYSICAL EXAM   Filed Vitals:   06/03/13 0902  BP: 103/73  Pulse: 88  Height: 5\' 1"  (1.549 m)  Weight: 156 lb (70.761 kg)    Not recorded    Body mass index is 29.49 kg/(m^2).   Generalized: In no acute distress  Neck: Supple, no carotid bruits   Cardiac: Regular rate rhythm  Pulmonary: Clear to auscultation bilaterally  Musculoskeletal: No deformity  Neurological examination  Mentation: Alert oriented to time, place, history taking, and causual conversation  Cranial nerve  II-XII: Pupils were equal round reactive to light. Extraocular movements were full.  Visual field were full on confrontational test. Bilateral fundi were sharp.  Facial sensation and strength were normal. Hearing was intact to finger rubbing bilaterally. Uvula tongue midline.  Head turning and shoulder shrug and were normal and symmetric.Tongue protrusion into cheek strength was normal.  Motor: Normal tone, bulk and strength, with the exception of mild fixation of left arm on rapid alternating movements.  Sensory: Intact to fine touch, pinprick, preserved vibratory sensation, and proprioception at toes.  Coordination: Normal finger to nose, heel-to-shin bilaterally there was no truncal ataxia  Gait: Rising up from seated position without assistance, normal stance, without trunk ataxia, moderate stride, good arm swing, smooth turning, able to perform tiptoe, and heel walking without difficulty.   Romberg signs: Negative  Deep tendon reflexes: Brachioradialis 2/2, biceps 2/2, triceps 2/2, patellar 2/2, Achilles 2/2, plantar responses were flexor  bilaterally.   DIAGNOSTIC DATA (LABS, IMAGING, TESTING) - I reviewed patient records, labs, notes, testing and imaging myself where available.  Lab Results  Component Value Date   WBC 5.7 04/08/2013   HGB 13.2 04/08/2013   HCT 38.0 04/08/2013   MCV 91.3 04/08/2013   PLT 190 04/08/2013      Component Value Date/Time   NA 138 04/08/2013 0701   K 4.7 04/08/2013 0701   CL 104 04/08/2013 0701   CO2 23 04/08/2013 0701   GLUCOSE 90 04/08/2013 0701   BUN 12.9 05/24/2013 1001   BUN 18 04/08/2013 0701   CREATININE 0.7 05/24/2013 1001   CREATININE 0.81 04/08/2013 0701   CALCIUM 9.0 04/08/2013 0701   PROT 7.6 11/19/2011 1210   ALBUMIN 4.1 11/19/2011 1210   AST 19 11/19/2011 1210   ALT 15 11/19/2011 1210   ALKPHOS 65 11/19/2011 1210   BILITOT 0.3 11/19/2011 1210   GFRNONAA >90 04/08/2013 0701   GFRAA >90 04/08/2013 0701   Lab Results  Component Value Date   CHOL 150  07/17/2009   HDL 29* 07/17/2009   LDLCALC 73 07/17/2009   TRIG 240* 07/17/2009   CHOLHDL 5.2 Ratio 07/17/2009   No results found for this basename: HGBA1C   No results found for this basename: VITAMINB12   Lab Results  Component Value Date   TSH 0.632 07/17/2009      ASSESSMENT AND PLAN  Priscillia Langowski is a 38 y.o. female with PMHx of Right parietal AVM, bleeding to right lateral ventricle, failed endovascular embolization, status post focal radiation therapy in January 2015, continue complains of constant frequent headaches, also has a history of complex partial seizure  1, I will add on Topamax, titrating to 100 mg twice a day as preventative medications 2. EEG 3. refill her Percocet 40 tablets each months 4. Return to clinic in 3 months   Marcial Pacas, M.D. Ph.D.  [facility] [address] [place], [place] [address] [phone]

## 2013-06-07 ENCOUNTER — Other Ambulatory Visit (HOSPITAL_COMMUNITY)
Admission: RE | Admit: 2013-06-07 | Discharge: 2013-06-07 | Disposition: A | Discharge status: Home / Self Care | Payer: 59 | Source: Ambulatory Visit | Attending: Obstetrics & Gynecology | Admitting: Obstetrics & Gynecology

## 2013-06-07 ENCOUNTER — Other Ambulatory Visit: Payer: Self-pay | Admitting: Obstetrics & Gynecology

## 2013-06-07 DIAGNOSIS — Z1151 Encounter for screening for human papillomavirus (HPV): Secondary | ICD-10-CM | POA: Insufficient documentation

## 2013-06-07 DIAGNOSIS — Z01419 Encounter for gynecological examination (general) (routine) without abnormal findings: Secondary | ICD-10-CM | POA: Insufficient documentation

## 2013-06-13 ENCOUNTER — Ambulatory Visit (INDEPENDENT_AMBULATORY_CARE_PROVIDER_SITE_OTHER): Payer: 59 | Admitting: Radiology

## 2013-06-13 DIAGNOSIS — R569 Unspecified convulsions: Secondary | ICD-10-CM

## 2013-06-13 DIAGNOSIS — Q282 Arteriovenous malformation of cerebral vessels: Secondary | ICD-10-CM

## 2013-06-13 DIAGNOSIS — I671 Cerebral aneurysm, nonruptured: Secondary | ICD-10-CM

## 2013-06-13 DIAGNOSIS — R519 Headache, unspecified: Secondary | ICD-10-CM

## 2013-06-13 DIAGNOSIS — R51 Headache: Secondary | ICD-10-CM

## 2013-06-13 DIAGNOSIS — F411 Generalized anxiety disorder: Secondary | ICD-10-CM

## 2013-06-15 NOTE — Procedures (Signed)
   HISTORY: 38 years old female, with history of right parietal AVM, bleeding into right lateral ventricle, failed endovascular embolization, status post focal radiation therapy in January 20/15, she complains of frequent headaches, also carries a diagnosis of complex partial seizure, last episode was at early March 2015  TECHNIQUE:  16 channel EEG was performed based on standard 10-16 international system. One channel was dedicated to EKG, which has demonstrates  sinus rhythm of 54 beats per minutes.  Upon awakening, the posterior background activity was well-developed, in alpha range, there was frequent bilateral frontal dominant small amplitude beta range activity, the posterior background activity was reactive to eye opening and closure.  There was no evidence of epilepsy form discharge.  Photic stimulation was performed, which induced a symmetric photic driving.  Hyperventilation was performed, there was no abnormality elicit.  Stage II sleep was achieved, as evident by K complex, and sleep spindles.  CONCLUSION: This is a  normal awake and asleep EEG.  There is no electrodiagnostic evidence of epileptiform discharge

## 2013-07-14 ENCOUNTER — Other Ambulatory Visit: Payer: Self-pay

## 2013-07-14 ENCOUNTER — Telehealth: Payer: Self-pay | Admitting: *Deleted

## 2013-07-14 DIAGNOSIS — F3289 Other specified depressive episodes: Secondary | ICD-10-CM

## 2013-07-14 DIAGNOSIS — F411 Generalized anxiety disorder: Secondary | ICD-10-CM

## 2013-07-14 DIAGNOSIS — F329 Major depressive disorder, single episode, unspecified: Secondary | ICD-10-CM

## 2013-07-14 DIAGNOSIS — R519 Headache, unspecified: Secondary | ICD-10-CM

## 2013-07-14 DIAGNOSIS — R51 Headache: Secondary | ICD-10-CM

## 2013-07-14 DIAGNOSIS — M25519 Pain in unspecified shoulder: Secondary | ICD-10-CM

## 2013-07-14 DIAGNOSIS — E669 Obesity, unspecified: Secondary | ICD-10-CM

## 2013-07-14 MED ORDER — TOPIRAMATE 100 MG PO TABS: ORAL_TABLET | ORAL | Status: DC

## 2013-07-14 MED ORDER — HYDROCODONE-ACETAMINOPHEN 5-325 MG PO TABS: 1.0000 | ORAL_TABLET | Freq: Three times a day (TID) | ORAL | Status: DC | PRN

## 2013-07-14 NOTE — Telephone Encounter (Signed)
I have called Katie [name], she is taking Topamax 100mg  bid, keppra 500mg  tid  She continues to have tinglings in her fingers, heels, especially with certain position,   EEG, was normal.   She has seizure again in  April 1st 2015.   Now she has a lot of pressure in her head, stuttering bad, repeating herself.  I have advised her to increase Topamax to 100mg  one tab po qam, 150mg  qhs., rx for hydrocodone. Keep follow up in June 2015.

## 2013-07-14 NOTE — Telephone Encounter (Signed)
Pt calling requesting EEG results and stating that she has a seizure at work a few weeks ago and that she is having pressure and light headed and dizziness. Since starting Topamax pt states that she is having tingling in her fingers and feet and she also needs a refill of hydrocodone. Please advise

## 2013-07-15 NOTE — Telephone Encounter (Signed)
Pt stopped by the office to pick up her Rx.

## 2013-08-01 NOTE — H&P (Signed)
Katie [name] is an 38 y.o. female. [id number] who presents for surgical management of her abnormal uterine bleeding.  In review, the patient states that for as long as she can remember her periods have been irregular. Her cycles range from every 2 to 4 weeks, very unpredictable. She also states that the menses are heavy with usually 7 days, but sometimes up to 14 days of bleeding. Reports on heavy days using at least 6 pads with passage of grape-sized clots. She also reports severe cramping during her menses and painful IC.  The patient has a h/o AVM and aneurysm s/p embolization and radiaton treatment. Per patient, this issue may take up to 1-2 years before resolved.  Pertinent Gynecological History: Menses: as above Contraception: tubal ligation DES exposure: denies Blood transfusions: none Sexually transmitted diseases: no past history Previous GYN Procedures: none  Last mammogram: <40yo Last pap: normal Date: March, 17 2015 OB History: G3, P2012   Past Medical History  Diagnosis Date  . Fibromyalgia   . Migraine   . Hypertension   . Depression   . Anxiety   . AVM (arteriovenous malformation)   . Cerebral aneurysm   . Seizures     Past Surgical History  Procedure Laterality Date  . Tubal ligation    . Cerebral embolization  dec 2013  . Wisdom tooth extraction  2001  . Radiation brain      Family History  Problem Relation Age of Onset  . Bipolar disorder Mother   . Thyroid disease Maternal Grandmother   . Glaucoma Maternal Grandmother   . Aneurysm Father     Social History:  reports that she has quit smoking. She has never used smokeless tobacco. She reports that she drinks about .6 ounces of alcohol per week. She reports that she does not use illicit drugs.  Allergies: No Known Allergies  Medications: Lamictal 750mg  bid Prochlorperazine 10mg  prn vomiting Norco 5/325mg  prn pain Lisinopirl 20mg  daily Clonazepam 0.5mg  daily as needed Topamax 25mg  daily Garcinia  Cambogia-Chromium 2 tablets daily   Review of Systems  Constitutional: Negative for fever and chills.  Eyes: Negative for blurred vision.  Respiratory: Negative for cough and shortness of breath.   Cardiovascular: Negative for chest pain and palpitations.  Gastrointestinal: Negative for heartburn, nausea and vomiting.  Genitourinary: Negative for dysuria, urgency and frequency.  Musculoskeletal: Negative for myalgias.  Skin: Negative for rash.  Neurological: Negative for dizziness and headaches.    Physical Exam  Constitutional: She is oriented to person, place, and time. She appears well-developed and well-nourished. No distress.  HENT:  Head: Normocephalic.  Eyes: EOM are normal.  Neck: Normal range of motion.  Cardiovascular: Regular rhythm and normal heart sounds.   Respiratory: Breath sounds normal.  GI: Soft. Bowel sounds are normal. She exhibits no distension. There is no tenderness. There is no rebound.  Genitourinary:  No external lesions, Vagina - pink moist mucosa, no lesions or abnormal discharge appreciated as pt is currently on menses, cervix - visualized, no discharge or lesions. No CMT. No adnexal masses bilaterally, minimal tenderness appreciated on exam, Uterus: nontender and normal size on palpation.   Musculoskeletal: Normal range of motion.  Neurological: She is alert and oriented to person, place, and time.  Skin: Skin is warm and dry.   TVUS on 06/15/13: Anteverted uterus: 8x5x4cm, no abnormalities, Bilateral ovaries unremarkable, no pelvic or adnexal masses seen  CBC on 3/10: 6.7>14.1/41.9<190   Assessment/Plan: 37yo [id number] who present for surgical management of abnormal uterine bleeding-  plan for hysteroscopy, D&C and Novasure ablation -NPO -LR @ 125cc/hr -Ancef 2g IV to OR -R/B/I and alternatives reviewed with patient in the office including risk of bleeding, cramping, uterine perforation and potential failure of ablation.  Annalee Genta 08/01/2013, 2:56 PM

## 2013-08-03 ENCOUNTER — Encounter (HOSPITAL_COMMUNITY)
Admission: RE | Admit: 2013-08-03 | Discharge: 2013-08-03 | Disposition: A | Discharge status: Home / Self Care | Payer: 59 | Source: Ambulatory Visit | Attending: Obstetrics & Gynecology | Admitting: Obstetrics & Gynecology

## 2013-08-03 ENCOUNTER — Other Ambulatory Visit: Payer: Self-pay

## 2013-08-03 ENCOUNTER — Encounter (HOSPITAL_COMMUNITY): Payer: Self-pay

## 2013-08-03 LAB — BASIC METABOLIC PANEL
BUN: 16 mg/dL (ref 6–23)
CO2: 20 mEq/L (ref 19–32)
CREATININE: 0.82 mg/dL (ref 0.50–1.10)
Calcium: 9.3 mg/dL (ref 8.4–10.5)
Chloride: 106 mEq/L (ref 96–112)
GFR calc non Af Amer: >90 mL/min (ref >90)
GLUCOSE: 83 mg/dL (ref 70–99)
Potassium: 3.6 mEq/L — ABNORMAL LOW (ref 3.7–5.3)
Sodium: 140 mEq/L (ref 137–147)

## 2013-08-03 LAB — CBC WITH DIFFERENTIAL/PLATELET
BASOS PCT: 0 % (ref 0–1)
Basophils Absolute: 0 10*3/uL (ref 0.0–0.1)
EOS ABS: 0.1 10*3/uL (ref 0.0–0.7)
Eosinophils Relative: 1 % (ref 0–5)
HEMATOCRIT: 37.3 % (ref 36.0–46.0)
HEMOGLOBIN: 12.8 g/dL (ref 12.0–15.0)
LYMPHS ABS: 2.1 10*3/uL (ref 0.7–4.0)
Lymphocytes Relative: 34 % (ref 12–46)
MCH: 31.6 pg (ref 26.0–34.0)
MCHC: 34.3 g/dL (ref 30.0–36.0)
MCV: 92.1 fL (ref 78.0–100.0)
MONOS PCT: 7 % (ref 3–12)
Monocytes Absolute: 0.4 10*3/uL (ref 0.1–1.0)
Neutro Abs: 3.6 10*3/uL (ref 1.7–7.7)
Neutrophils Relative %: 58 % (ref 43–77)
Platelets: 184 10*3/uL (ref 150–400)
RBC: 4.05 MIL/uL (ref 3.87–5.11)
RDW: 13.5 % (ref 11.5–15.5)
WBC: 6.2 10*3/uL (ref 4.0–10.5)

## 2013-08-03 NOTE — Patient Instructions (Signed)
[place]  08/03/2013   Your procedure is scheduled on:  08/04/13  Enter through the Main Entrance of [facility] up the phone at the desk and dial [phone].   Call this number if you have problems the morning of surgery: [phone]   Remember:   Do not eat food:After Midnight.  Do not drink clear liquids: After Midnight.  Take these medicines the morning of surgery with A SIP OF WATER: Keppra and Topamax   Do not wear jewelry, make-up or nail polish.  Do not wear lotions, powders, or perfumes. You may wear deodorant.  Do not shave 48 hours prior to surgery.  Do not bring valuables to the hospital.  [facility] is not   responsible for any belongings or valuables brought to the hospital.  Contacts, dentures or bridgework may not be worn into surgery.  Leave suitcase in the car. After surgery it may be brought to your room.  For patients admitted to the hospital, checkout time is 11:00 AM the day of              discharge.   Patients discharged the day of surgery will not be allowed to drive             home.  Name and phone number of your driver: NA  Special Instructions:      Please read over the following fact sheets that you were given:   Surgical Site Infection Prevention

## 2013-08-03 NOTE — Pre-Procedure Instructions (Signed)
Patient instructed to hold blood pressure tomorrow due to BP of 82/63 per Lyndle Herrlich, MD.

## 2013-08-03 NOTE — Pre-Procedure Instructions (Signed)
Medical history and previous testing results reviewed by Lyndle Herrlich, MD. No orders given.

## 2013-08-04 ENCOUNTER — Encounter (HOSPITAL_COMMUNITY): Payer: 59 | Admitting: Anesthesiology

## 2013-08-04 ENCOUNTER — Ambulatory Visit (HOSPITAL_COMMUNITY): Payer: 59 | Admitting: Anesthesiology

## 2013-08-04 ENCOUNTER — Ambulatory Visit (HOSPITAL_COMMUNITY)
Admission: RE | Admit: 2013-08-04 | Discharge: 2013-08-04 | Disposition: A | Discharge status: Home / Self Care | Payer: 59 | Source: Ambulatory Visit | Attending: Obstetrics & Gynecology | Admitting: Obstetrics & Gynecology

## 2013-08-04 ENCOUNTER — Encounter (HOSPITAL_COMMUNITY)
Admission: RE | Disposition: A | Discharge status: Home / Self Care | Payer: Self-pay | Source: Ambulatory Visit | Attending: Obstetrics & Gynecology

## 2013-08-04 ENCOUNTER — Encounter (HOSPITAL_COMMUNITY): Payer: Self-pay | Admitting: *Deleted

## 2013-08-04 DIAGNOSIS — R569 Unspecified convulsions: Secondary | ICD-10-CM | POA: Insufficient documentation

## 2013-08-04 DIAGNOSIS — F411 Generalized anxiety disorder: Secondary | ICD-10-CM | POA: Insufficient documentation

## 2013-08-04 DIAGNOSIS — F3289 Other specified depressive episodes: Secondary | ICD-10-CM | POA: Insufficient documentation

## 2013-08-04 DIAGNOSIS — N939 Abnormal uterine and vaginal bleeding, unspecified: Principal | ICD-10-CM | POA: Insufficient documentation

## 2013-08-04 DIAGNOSIS — N926 Irregular menstruation, unspecified: Secondary | ICD-10-CM | POA: Insufficient documentation

## 2013-08-04 DIAGNOSIS — Z87891 Personal history of nicotine dependence: Secondary | ICD-10-CM | POA: Insufficient documentation

## 2013-08-04 DIAGNOSIS — N946 Dysmenorrhea, unspecified: Secondary | ICD-10-CM | POA: Insufficient documentation

## 2013-08-04 DIAGNOSIS — F329 Major depressive disorder, single episode, unspecified: Secondary | ICD-10-CM | POA: Insufficient documentation

## 2013-08-04 DIAGNOSIS — I10 Essential (primary) hypertension: Secondary | ICD-10-CM | POA: Insufficient documentation

## 2013-08-04 HISTORY — PX: DILITATION & CURRETTAGE/HYSTROSCOPY WITH NOVASURE ABLATION: SHX5568

## 2013-08-04 LAB — PREGNANCY, URINE: Preg Test, Ur: NEGATIVE

## 2013-08-04 SURGERY — DILATATION & CURETTAGE/HYSTEROSCOPY WITH NOVASURE ABLATION
Anesthesia: General | Site: Vagina

## 2013-08-04 MED ORDER — EPHEDRINE SULFATE 50 MG/ML IJ SOLN
INTRAMUSCULAR | Status: DC | PRN
  Administered 2013-08-04: 5 mg via INTRAVENOUS
  Administered 2013-08-04: 10 mg via INTRAVENOUS

## 2013-08-04 MED ORDER — ONDANSETRON HCL 4 MG/2ML IJ SOLN
INTRAMUSCULAR | Status: AC
  Filled 2013-08-04: qty 2

## 2013-08-04 MED ORDER — FENTANYL CITRATE 0.05 MG/ML IJ SOLN
INTRAMUSCULAR | Status: DC | PRN
  Administered 2013-08-04: 100 ug via INTRAVENOUS
  Administered 2013-08-04 (×3): 50 ug via INTRAVENOUS

## 2013-08-04 MED ORDER — DEXAMETHASONE SODIUM PHOSPHATE 4 MG/ML IJ SOLN
INTRAMUSCULAR | Status: DC | PRN
  Administered 2013-08-04: 10 mg via INTRAVENOUS

## 2013-08-04 MED ORDER — MIDAZOLAM HCL 2 MG/2ML IJ SOLN
INTRAMUSCULAR | Status: DC | PRN
  Administered 2013-08-04: 2 mg via INTRAVENOUS

## 2013-08-04 MED ORDER — PROMETHAZINE HCL 25 MG/ML IJ SOLN: 6.2500 mg | INTRAMUSCULAR | Status: DC | PRN

## 2013-08-04 MED ORDER — CEFAZOLIN SODIUM-DEXTROSE 2-3 GM-% IV SOLR
INTRAVENOUS | Status: AC
  Administered 2013-08-04: 2 g via INTRAVENOUS
  Filled 2013-08-04: qty 50

## 2013-08-04 MED ORDER — PROPOFOL 10 MG/ML IV EMUL
INTRAVENOUS | Status: AC
  Filled 2013-08-04: qty 20

## 2013-08-04 MED ORDER — MEPERIDINE HCL 25 MG/ML IJ SOLN: 6.2500 mg | INTRAMUSCULAR | Status: DC | PRN

## 2013-08-04 MED ORDER — KETOROLAC TROMETHAMINE 30 MG/ML IJ SOLN: 15.0000 mg | Freq: Once | INTRAMUSCULAR | Status: DC | PRN

## 2013-08-04 MED ORDER — KETOROLAC TROMETHAMINE 30 MG/ML IJ SOLN
INTRAMUSCULAR | Status: AC
  Filled 2013-08-04: qty 1

## 2013-08-04 MED ORDER — MIDAZOLAM HCL 2 MG/2ML IJ SOLN: 0.5000 mg | Freq: Once | INTRAMUSCULAR | Status: DC | PRN

## 2013-08-04 MED ORDER — FENTANYL CITRATE 0.05 MG/ML IJ SOLN
INTRAMUSCULAR | Status: AC
  Filled 2013-08-04: qty 5

## 2013-08-04 MED ORDER — EPHEDRINE 5 MG/ML INJ
INTRAVENOUS | Status: AC
  Filled 2013-08-04: qty 10

## 2013-08-04 MED ORDER — KETOROLAC TROMETHAMINE 30 MG/ML IJ SOLN
INTRAMUSCULAR | Status: DC | PRN
  Administered 2013-08-04: 30 mg via INTRAVENOUS

## 2013-08-04 MED ORDER — FENTANYL CITRATE 0.05 MG/ML IJ SOLN
INTRAMUSCULAR | Status: AC
  Filled 2013-08-04: qty 2

## 2013-08-04 MED ORDER — LIDOCAINE HCL (CARDIAC) 20 MG/ML IV SOLN
INTRAVENOUS | Status: AC
  Filled 2013-08-04: qty 5

## 2013-08-04 MED ORDER — MIDAZOLAM HCL 2 MG/2ML IJ SOLN
INTRAMUSCULAR | Status: AC
  Filled 2013-08-04: qty 2

## 2013-08-04 MED ORDER — FENTANYL CITRATE 0.05 MG/ML IJ SOLN
25.0000 ug | INTRAMUSCULAR | Status: DC | PRN
  Administered 2013-08-04 (×2): 50 ug via INTRAVENOUS

## 2013-08-04 MED ORDER — SODIUM CHLORIDE 0.9 % IR SOLN
Status: DC | PRN
  Administered 2013-08-04: 3000 mL

## 2013-08-04 MED ORDER — LACTATED RINGERS IV SOLN
INTRAVENOUS | Status: DC
  Administered 2013-08-04 (×2): via INTRAVENOUS

## 2013-08-04 MED ORDER — PROPOFOL INFUSION 10 MG/ML OPTIME
INTRAVENOUS | Status: DC | PRN
  Administered 2013-08-04: 200 mL via INTRAVENOUS

## 2013-08-04 MED ORDER — DEXAMETHASONE SODIUM PHOSPHATE 10 MG/ML IJ SOLN
INTRAMUSCULAR | Status: AC
  Filled 2013-08-04: qty 1

## 2013-08-04 MED ORDER — CEFAZOLIN SODIUM-DEXTROSE 2-3 GM-% IV SOLR: 2.0000 g | INTRAVENOUS | Status: DC

## 2013-08-04 MED ORDER — ONDANSETRON HCL 4 MG/2ML IJ SOLN
INTRAMUSCULAR | Status: DC | PRN
  Administered 2013-08-04: 4 mg via INTRAVENOUS

## 2013-08-04 MED ORDER — GLYCOPYRROLATE 0.2 MG/ML IJ SOLN
INTRAMUSCULAR | Status: DC | PRN
  Administered 2013-08-04: 0.1 mg via INTRAVENOUS

## 2013-08-04 MED ORDER — LIDOCAINE HCL (CARDIAC) 20 MG/ML IV SOLN
INTRAVENOUS | Status: DC | PRN
  Administered 2013-08-04: 80 mg via INTRAVENOUS

## 2013-08-04 SURGICAL SUPPLY — 20 items
ABLATOR ENDOMETRIAL BIPOLAR (ABLATOR) ×4 IMPLANT
CANISTER SUCT 3000ML (MISCELLANEOUS) ×4 IMPLANT
CATH ROBINSON RED A/P 16FR (CATHETERS) ×4 IMPLANT
CLOTH BEACON ORANGE TIMEOUT ST (SAFETY) ×4 IMPLANT
CONTAINER PREFILL 10% NBF 60ML (FORM) ×8 IMPLANT
DILATOR CANAL MILEX (MISCELLANEOUS) IMPLANT
DRAPE HYSTEROSCOPY (DRAPE) ×4 IMPLANT
DRSG TELFA 3X8 NADH (GAUZE/BANDAGES/DRESSINGS) ×4 IMPLANT
GLOVE BIOGEL PI IND STRL 6.5 (GLOVE) ×4 IMPLANT
GLOVE BIOGEL PI INDICATOR 6.5 (GLOVE) ×4
GLOVE ECLIPSE 6.5 STRL STRAW (GLOVE) ×4 IMPLANT
GOWN STRL REUS W/TWL LRG LVL3 (GOWN DISPOSABLE) ×8 IMPLANT
NEEDLE SPNL 22GX3.5 QUINCKE BK (NEEDLE) ×4 IMPLANT
PACK VAGINAL MINOR WOMEN LF (CUSTOM PROCEDURE TRAY) ×4 IMPLANT
PAD OB MATERNITY 4.3X12.25 (PERSONAL CARE ITEMS) ×4 IMPLANT
SET TUBING HYSTEROSCOPY 2 NDL (TUBING) ×4 IMPLANT
SYR CONTROL 10ML LL (SYRINGE) ×4 IMPLANT
TOWEL OR 17X24 6PK STRL BLUE (TOWEL DISPOSABLE) ×8 IMPLANT
TUBE HYSTEROSCOPY W Y-CONNECT (TUBING) ×4 IMPLANT
WATER STERILE IRR 1000ML POUR (IV SOLUTION) IMPLANT

## 2013-08-04 NOTE — Discharge Instructions (Signed)
HOME INSTRUCTIONS  Please note any unusual or excessive bleeding, pain, swelling. Mild dizziness or drowsiness are normal for about 24 hours after surgery.   Shower when comfortable  Restrictions: No driving for 24 hours or while taking pain medications.  Activity:  No heavy lifting (> 10 lbs), nothing in vagina (no tampons, douching, or intercourse) x 4 weeks; no tub baths for 2 weeks Vaginal spotting is expected but if your bleeding is heavy, period like,  please call the office   Incision: the bandaids will fall off when they are ready to; you may clean your incision with mild soap and water but do not rub or scrub the incision site.  You may experience slight bloody drainage from your incision periodically.  This is normal.  If you experience a large amount of drainage or the incision opens, please call your physician who will likely direct you to the emergency department.  Diet:  You may eat whatever you want.  Do not eat large meals.  Eat small frequent meals throughout the day.  Continue to drink a good amount of water at least 6-8 glasses of water per day, hydration is very important for the healing process.  Pain Management: Take Motrin and/or Norco as prescribed/needed for pain.  Always take prescription pain medication with food, it may cause constipation, increase fluids and fiber and you may want to take an over-the-counter stool softener like Colace as needed up to 2x a day.    Alcohol -- Avoid for 24 hours and while taking pain medications.  Nausea: Take sips of ginger ale or soda  Fever -- Call physician if temperature over 101 degrees  Follow up:  If you do not already have a follow up appointment scheduled, please call the office at [phone].  If you experience fever (a temperature greater than 100.4), pain unrelieved by pain medication, shortness of breath, swelling of a single leg, or any other symptoms which are concerning to you please the office immediately. Post  Anesthesia Home Care Instructions  Activity: Get plenty of rest for the remainder of the day. A responsible adult should stay with you for 24 hours following the procedure.  For the next 24 hours, DO NOT: -Drive a car -Paediatric nurse -Drink alcoholic beverages -Take any medication unless instructed by your physician -Make any legal decisions or sign important papers.  Meals: Start with liquid foods such as gelatin or soup. Progress to regular foods as tolerated. Avoid greasy, spicy, heavy foods. If nausea and/or vomiting occur, drink only clear liquids until the nausea and/or vomiting subsides. Call your physician if vomiting continues.  Special Instructions/Symptoms: Your throat may feel dry or sore from the anesthesia or the breathing tube placed in your throat during surgery. If this causes discomfort, gargle with warm salt water. The discomfort should disappear within 24 hours.

## 2013-08-04 NOTE — Interval H&P Note (Signed)
History and Physical Interval Note:  08/04/2013 11:36 AM  Katie [name]  has presented today for surgery, with the diagnosis of AUB  The various methods of treatment have been discussed with the patient and family. After consideration of risks, benefits and other options for treatment, the patient has consented to  Procedure(s): NOVASURE ABLATION (N/A), hysteroscopy, D&C as a surgical intervention .  The patient's history has been reviewed, patient examined, no change in status, stable for surgery.  I have reviewed the patient's chart and labs.  Questions were answered to the patient's satisfaction.     Annalee Genta

## 2013-08-04 NOTE — Anesthesia Postprocedure Evaluation (Signed)
  Anesthesia Post Note  Patient: Katie [name]  Procedure(s) Performed: Procedure(s) (LRB): DILATATION & CURETTAGE/HYSTEROSCOPY WITH NOVASURE ABLATION (N/A)  Anesthesia type: GA  Patient location: PACU  Post pain: Pain level controlled  Post assessment: Post-op Vital signs reviewed  Last Vitals:  Filed Vitals:   08/04/13 1358  BP:   Pulse:   Temp: 36.7 C  Resp:     Post vital signs: Reviewed  Level of consciousness: sedated  Complications: No apparent anesthesia complications

## 2013-08-04 NOTE — Op Note (Signed)
Operative Report  PreOp: Heavy menstrual bleeding, dysmenorrhea PostOp: same Procedure:  Hysteroscopy, Dilation and Curettage, Endometrial ablation Surgeon: Dr. Janyth Pupa Anesthesia: General Complications:none EBL: Minimal UOP: 50cc  Findings:  Anteverted 9wk sized uterus with proliferative endometrium.  Both ostia visualized  Specimens: 1) ECC 2) EMB  Indications: Katie [name] is an 38 y.o. female. [id number] who presents for surgical management of her abnormal uterine bleeding. In review, the patient states that for as long as she can remember her periods have been irregular. Her cycles range from every 2 to 4 weeks, very unpredictable. She also states that the menses are heavy with usually 7 days, but sometimes up to 14 days of bleeding. Reports on heavy days using at least 6 pads with passage of grape-sized clots. She also reports severe cramping during her menses and painful IC.   Procedure: The patient was taken to the operating room where she underwent general anesthesia without difficulty. The patient was placed in a low lithotomy position using Allen stirrups. She was then prepped and draped in the normal sterile fashion. The bladder was drained using a red rubber urethral catheter. A sterile speculum was inserted into the vagina. A single tooth tenaculum was placed on the anterior lip of the cervix. ECC was obtained.  The uterus was then sounded to 9wk, cervical length 4cm. The endocervical canal was then serially dilated to 15French using Hank dilators.  The diagnostic hysteroscope was then inserted without difficulty and noted to have the findings as listed above. Visualization was achieved using NS as a distending medium. The hysteroscope was removed and sharp curettage was performed. The tissue was sent to pathology.   Attention was then turned to the Novasure. The Novasure was set up according to manufacture instructions. The cavity length was set to 5. The Novasure was inserted,  seating test performed and the cavity width was noted to be 4.2. Cavity assessment was performed and passed. The device was then activated for 90sec at a power level of 116. Upon completion, the Novasure was removed and the hysteroscope was reinserted. Global ablation was visualized and no uterine perforation was seen. All instrument were then removed. Hemostasis was observed at the cervical site.  The patient was repositioned to the supine position. The patient tolerated the procedure without any complications and taken to recovery in stable condition.   Janyth Pupa, DO [phone] (pager) [phone] (office)

## 2013-08-04 NOTE — Anesthesia Preprocedure Evaluation (Signed)
Anesthesia Evaluation  Patient identified by MRN, date of birth, ID band Patient awake    Reviewed: Allergy & Precautions, H&P , Patient's Chart, lab work & pertinent test results, reviewed documented beta blocker date and time   Airway Mallampati: II TM Distance: >3 FB Neck ROM: full    Dental no notable dental hx.    Pulmonary former smoker,  breath sounds clear to auscultation  Pulmonary exam normal       Cardiovascular hypertension, On Medications Rhythm:regular Rate:Normal     Neuro/Psych Seizures - (Last seizure one month ago, taking medicine routinly),     GI/Hepatic   Endo/Other    Renal/GU      Musculoskeletal   Abdominal   Peds  Hematology   Anesthesia Other Findings   Reproductive/Obstetrics                           Anesthesia Physical Anesthesia Plan  ASA: II  Anesthesia Plan:    Post-op Pain Management:    Induction: Intravenous  Airway Management Planned: LMA  Additional Equipment:   Intra-op Plan:   Post-operative Plan:   Informed Consent: I have reviewed the patients History and Physical, chart, labs and discussed the procedure including the risks, benefits and alternatives for the proposed anesthesia with the patient or authorized representative who has indicated his/her understanding and acceptance.   Dental Advisory Given and Dental advisory given  Plan Discussed with: CRNA and Surgeon  Anesthesia Plan Comments: (Discussed GA with LMA, possible sore throat, potential need to switch to ETT, N/V, pulmonary aspiration. Questions answered. )        Anesthesia Quick Evaluation

## 2013-08-04 NOTE — Transfer of Care (Signed)
Immediate Anesthesia Transfer of Care Note  Patient: Katie [name]  Procedure(s) Performed: Procedure(s): DILATATION & CURETTAGE/HYSTEROSCOPY WITH NOVASURE ABLATION (N/A)  Patient Location: PACU  Anesthesia Type:General  Level of Consciousness: awake, alert  and oriented  Airway & Oxygen Therapy: Patient Spontanous Breathing and Patient connected to nasal cannula oxygen  Post-op Assessment: Report given to PACU RN and Post -op Vital signs reviewed and stable  Post vital signs: Reviewed and stable  Complications: No apparent anesthesia complications

## 2013-08-04 NOTE — Anesthesia Procedure Notes (Signed)
Procedure Name: LMA Insertion Date/Time: 08/04/2013 1:20 PM Performed by: Flossie Dibble Pre-anesthesia Checklist: Patient identified, Timeout performed, Emergency Drugs available, Suction available and Patient being monitored Patient Re-evaluated:Patient Re-evaluated prior to inductionOxygen Delivery Method: Circle system utilized Preoxygenation: Pre-oxygenation with 100% oxygen Intubation Type: IV induction LMA: LMA inserted LMA Size: 4.0 Number of attempts: 1 Placement Confirmation: positive ETCO2 and breath sounds checked- equal and bilateral Tube secured with: Tape Dental Injury: Teeth and Oropharynx as per pre-operative assessment

## 2013-08-05 ENCOUNTER — Encounter (HOSPITAL_COMMUNITY): Payer: Self-pay | Admitting: Obstetrics & Gynecology

## 2013-08-08 ENCOUNTER — Encounter (HOSPITAL_COMMUNITY): Payer: Self-pay | Admitting: *Deleted

## 2013-08-08 ENCOUNTER — Other Ambulatory Visit: Payer: Self-pay | Admitting: Obstetrics & Gynecology

## 2013-08-08 ENCOUNTER — Inpatient Hospital Stay (HOSPITAL_COMMUNITY)
Admission: AD | Admit: 2013-08-08 | Discharge: 2013-08-08 | Disposition: A | Discharge status: Home / Self Care | Payer: 59 | Source: Ambulatory Visit | Attending: Obstetrics & Gynecology | Admitting: Obstetrics & Gynecology

## 2013-08-08 DIAGNOSIS — Q283 Other malformations of cerebral vessels: Secondary | ICD-10-CM | POA: Insufficient documentation

## 2013-08-08 DIAGNOSIS — Z87891 Personal history of nicotine dependence: Secondary | ICD-10-CM | POA: Insufficient documentation

## 2013-08-08 DIAGNOSIS — IMO0001 Reserved for inherently not codable concepts without codable children: Secondary | ICD-10-CM | POA: Insufficient documentation

## 2013-08-08 DIAGNOSIS — M79609 Pain in unspecified limb: Secondary | ICD-10-CM

## 2013-08-08 DIAGNOSIS — I10 Essential (primary) hypertension: Secondary | ICD-10-CM | POA: Insufficient documentation

## 2013-08-08 DIAGNOSIS — M79661 Pain in right lower leg: Secondary | ICD-10-CM

## 2013-08-08 HISTORY — DX: Hemangioma of intra-abdominal structures: D18.03

## 2013-08-08 NOTE — MAU Note (Addendum)
Pt had ablation procedure last Thurs.   Started experiencing leg pain.  Doctor advised pt to come to MAU for ultrasound.  Pt waited all weekend thinking it may just be a "charlie horse".  Here today to get checked out.  No distress noted.  Pt ambulating without difficulty.

## 2013-08-08 NOTE — MAU Provider Note (Signed)
History     CSN: [id number]  Arrival date and time: 08/08/13 1037   First Provider Initiated Contact with Patient 08/08/13 1235      Chief Complaint  Patient presents with  . Leg Pain   HPI Katie [name] is 38 y.o. [id number] patient of Dr. Nelda Marseille.  Hx of ablation 5/14 here by Dr. Nelda Marseille.  She began with pain the following day behind her right calf.  The pain is intermittent.  Standing and bending leg makes it worse.  Better straight out.  She denies redness or swelling in either calf.   She call the office and they left message Friday evening, to come here.  She didn't get the message until Saturday.  The pain lessened so she did not come in.  The pain returned yesterday and called office this am.  Instructed to come here.  She denies fever, shortness of breath, good appetitie.  She is no longer having vaginal bleeding.  Denies personal hx of blood clots.  + family hx but she is not sure where.  Patient states she cannot stay past 2 because she has to pick up husband up from work.  Past Medical History  Diagnosis Date  . Fibromyalgia   . Migraine   . Hypertension   . Depression   . Anxiety   . AVM (arteriovenous malformation)   . Cerebral aneurysm   . Seizures   . Liver hemangioma     Past Surgical History  Procedure Laterality Date  . Tubal ligation    . Cerebral embolization  dec 2013  . Wisdom tooth extraction  2001  . Radiation brain    . Dilitation & currettage/hystroscopy with novasure ablation N/A 08/04/2013    Procedure: DILATATION & CURETTAGE/HYSTEROSCOPY WITH NOVASURE ABLATION;  Surgeon: Annalee Genta, DO;  Location: [facility];  Service: Gynecology;  Laterality: N/A;  . Cerebral angiogram      Family History  Problem Relation Age of Onset  . Bipolar disorder Mother   . Thyroid disease Maternal Grandmother   . Glaucoma Maternal Grandmother   . Aneurysm Father     History  Substance Use Topics  . Smoking status: Former Research scientist (life sciences)  . Smokeless tobacco: Never Used      Comment: Quit 2007  . Alcohol Use: 0.6 oz/week    1 Cans of beer per week     Comment: occasional    Allergies: No Known Allergies  Prescriptions prior to admission  Medication Sig Dispense Refill  . clonazePAM (KLONOPIN) 0.5 MG tablet Take 1 tablet (0.5 mg total) by mouth 2 (two) times daily as needed for anxiety. For anxiety  30 tablet  0  . GARCINIA CAMBOGIA-CHROMIUM PO Take 2 tablets by mouth daily.       Marland Kitchen HYDROcodone-acetaminophen (NORCO/VICODIN) 5-325 MG per tablet Take 1 tablet by mouth every 8 (eight) hours as needed for moderate pain.  40 tablet  0  . levETIRAcetam (KEPPRA) 500 MG tablet Take 500 mg by mouth 3 (three) times daily.      Marland Kitchen lisinopril (PRINIVIL,ZESTRIL) 20 MG tablet Take 20 mg by mouth every morning.      . prochlorperazine (COMPAZINE) 10 MG tablet Take 1 tablet (10 mg total) by mouth every 6 (six) hours as needed for nausea or vomiting.  30 tablet  0  . topiramate (TOPAMAX) 100 MG tablet One po qam and one and 1/2 po qhs  80 tablet  12    Review of Systems  Constitutional: Negative for fever and chills.  Gastrointestinal: Negative for abdominal pain.  Genitourinary:       Neg for vaginal bleeding  Musculoskeletal:       Right lower calf pain without swelling or redness   Physical Exam   Blood pressure 101/64, pulse 51, temperature 98.4 F (36.9 C), temperature source Oral, resp. rate 18, height 5' 0.75" (1.543 m), weight 145 lb 6.4 oz (65.953 kg), SpO2 100.00%.  Physical Exam  Constitutional: She is oriented to person, place, and time. She appears well-developed and well-nourished.  HENT:  Head: Normocephalic.  Neck: Normal range of motion.  Cardiovascular: Normal rate.   Respiratory: Effort normal. No respiratory distress.  Musculoskeletal:  Calves bilaterally without redness, swelling.  There is mild tenderness on the lower right calf muscle.    Neurological: She is alert and oriented to person, place, and time.  Skin: Skin is warm and dry.   Psychiatric: She has a normal mood and affect. Her behavior is normal.    MAU Course  Procedures  MDM 12:50  MSE reported to Dr. Nelda Marseille.  Explained the doppler testing is done by [facility] radiology, they come here to do testing.  Patient states she has to leave to pick up husband by 2pm.  Order given to explain to patient we would prefer to evaluate but if she has to leave, the office will call and make appt for Outpt Doppler testing at [facility] and call her with appointment.    Assessment and Plan  A:  Right calf pain      S/P endometrial ablation  P:  Explained importance of having this pain evaluated.  Patient cannot stay.  Instructed patient to return to hospital if she has shortness of breath, chest pain, worsening sxs in her calf--redness, swelling, increased pain.  She agreed      She understands the office will scheduled doppler testing and call her with instructions.   Katie [name] 08/08/2013, 12:36 PM

## 2013-08-08 NOTE — Discharge Instructions (Signed)
IT IS VERY IMPORTANT THAT YOU REPORT ANY CHANGE IN THE PAIN OF HER CALF>  IF YOU HAVE CHEST PAIN, SHORTNESS OF BREATH, FEVER, INCREASED PAIN< REDNESS or swelling of the calf return to hospital for further evaluation

## 2013-08-08 NOTE — MAU Note (Signed)
Patient states she had an ablation last week. States she started having leg pain on 5-15 that is in the back of her right leg, and continues to have a dull achy feeling. Slight swelling in ankle but went away.

## 2013-08-09 ENCOUNTER — Ambulatory Visit
Admission: RE | Admit: 2013-08-09 | Discharge: 2013-08-09 | Disposition: A | Discharge status: Home / Self Care | Payer: 59 | Source: Ambulatory Visit | Attending: Obstetrics & Gynecology | Admitting: Obstetrics & Gynecology

## 2013-08-09 DIAGNOSIS — M79609 Pain in unspecified limb: Secondary | ICD-10-CM

## 2013-09-02 ENCOUNTER — Telehealth: Payer: Self-pay | Admitting: Neurology

## 2013-09-02 NOTE — Telephone Encounter (Signed)
Patient calling to state that she has had a headache for 2 days now and nothing she has tried will provide relief, patient requesting that hydrocodone be called in for her to last until she can be seen for her appointment on Tuesday.

## 2013-09-05 ENCOUNTER — Ambulatory Visit: Payer: 59 | Admitting: Neurology

## 2013-09-05 NOTE — Telephone Encounter (Signed)
Missed her appt today, try to give her another follow up appt to discuss her concerns.

## 2013-09-05 NOTE — Telephone Encounter (Signed)
Pt called back thought tomorrow was the 15th, pt states she has had a lot on her mind. I did r/s her for Friday 09/09/13 at 8:00, pt still would like for someone to call her back concerning her migraines that they are still bothering her and that she would like to see if she could call her something in until she comes in on Friday. Thanks

## 2013-09-05 NOTE — Telephone Encounter (Signed)
Called pt and left message informing her that she had an appt today at 11:00 am with Dr. [name] and she missed her appt and wanted to know more about her headaches and if she could please give our office a call back.

## 2013-09-05 NOTE — Telephone Encounter (Signed)
Calling again regarding her headache.  Continues to have a headache today.  What to do?  Please call.

## 2013-09-06 NOTE — Telephone Encounter (Signed)
Pt returning call and stating that she is still having headaches. I informed the pt per Dr. [name] that she missed her appt on 09/05/13 and that another appt has been set up for her to come in on 09/09/13, to f/u and to discuss pt's concerns. I asked the pt how was she taking her medication Topiramate 100 mg. Pt stated that she was taking 1 tab in am and 1/2 tab at night. I explained to the pt that her Rx states that she should be takine topiramate 100 mg 1 tab in am and 1 1/2 tab at night. I advised the pt to try this and if she is still having headaches to give our office a call back. Pt verbalized understanding.

## 2013-09-09 ENCOUNTER — Encounter: Payer: Self-pay | Admitting: Neurology

## 2013-09-09 ENCOUNTER — Ambulatory Visit (INDEPENDENT_AMBULATORY_CARE_PROVIDER_SITE_OTHER): Payer: 59 | Admitting: Neurology

## 2013-09-09 VITALS — BP 114/77 | HR 64 | Ht 61.0 in | Wt 138.0 lb

## 2013-09-09 DIAGNOSIS — R51 Headache: Secondary | ICD-10-CM

## 2013-09-09 DIAGNOSIS — Q283 Other malformations of cerebral vessels: Secondary | ICD-10-CM

## 2013-09-09 DIAGNOSIS — R569 Unspecified convulsions: Secondary | ICD-10-CM

## 2013-09-09 DIAGNOSIS — Q282 Arteriovenous malformation of cerebral vessels: Secondary | ICD-10-CM

## 2013-09-09 DIAGNOSIS — R519 Headache, unspecified: Secondary | ICD-10-CM

## 2013-09-09 DIAGNOSIS — I671 Cerebral aneurysm, nonruptured: Secondary | ICD-10-CM

## 2013-09-09 MED ORDER — TOPIRAMATE 100 MG PO TABS: ORAL_TABLET | ORAL | Status: DC

## 2013-09-09 MED ORDER — RIZATRIPTAN BENZOATE 5 MG PO TBDP: 5.0000 mg | ORAL_TABLET | ORAL | Status: DC | PRN

## 2013-09-09 MED ORDER — HYDROCODONE-ACETAMINOPHEN 5-325 MG PO TABS: 1.0000 | ORAL_TABLET | Freq: Three times a day (TID) | ORAL | Status: DC | PRN

## 2013-09-09 MED ORDER — LAMOTRIGINE 100 MG PO TABS: ORAL_TABLET | ORAL | Status: DC

## 2013-09-09 NOTE — Progress Notes (Signed)
PATIENT: Katie [name] DOB: 05-26-1975  HISTORICAL (initial visit March 2015)  Katie [name] is a 38 years old right-handed Caucasian female, is referred by her primary care physician Dr. 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 Christella Noa,   Initially she was referred to [facility] endovascular embolization, super-selective WADA which she passed and subsequently had transarterial embolization in November 2013,  Postsurgically, she continued to have intractable headaches, sometimes presented to the emergency room, she was given prescription of hydrocodone 5/325 mg 60 tablets each month,  Repeat angiogram in January 2015 demonstrate patent right AVM, moderate fast flow subcortical deep nidus of an arteriovenous malformation involving the right parietal/occipital subcortical region, supplied by the distal posterior parietal branches of the superior division of the right middle cerebral artery, the distal  pericallosal artery on the right and also from the occipital branches of the right posterior cerebral artery. Venous drainage is inferiorly and posteriorly via a prominent right internal cerebral vein associated with a moderate-sized venous aneurysm proximally, with subsequent drainage into the vein of Galen  and the straight sinus. No arterial aneurysms are identified.   She underwent radiation therapy at [facility] by Dr. Tammi Klippel in January 2015: 5 Dynamic Conformal Arcs to a prescription dose of 18 Gy. ExacTrac registration was performed for each couch angle. The 85.5% isodose line was prescribed.   Most recent MRI of brain in March 2015 there is no  significant interval change in the appearance of a right parietal arteriovenous malformation. Draining veins to the vein of Galen as well as superficial draining veins are stable. There is no enhancement or evidence for hemorrhage. The dural sinuses are patent  and stable. Encephalomalacia associated with the lesion is unchanged.  Ventricles are of normal size. No significant extra-axial fluid collection is present.  She now complains constant 3 out of 10 headaches, 3-4 times each month, it would be exacerbated to a much severe 8 out of 10 headaches, she has to take hydrocodone, never tried preventive medications in the past, she works full-time as a Educational psychologist, has mild left arm, leg heaviness, numbness, no visual change,  She also continued to have spells self transient staring, confusion, suggestive of complex partial seizure last one was 2 weeks ago,  She is currently taking Keppra 750 mg twice a day.  UPDATE June 19th 2015: She works as Educational psychologist, she kept on Emergency planning/management officer,  Administrator, Civil Service, she felt funny at right parietal regions, eyes, ears hurt, ringing, pounding, she did not feel herself, zoomed out, this morning, she woke up with a bad headache, nauseous, she also has spells of irritable,   She has 2-3 headaches each week, lasting for days, she is taking hydrocodone 1-2 tabs prn,  Dr. Christella Noa gave her rx,   REVIEW OF SYSTEMS: Full 14 system review of systems performed and notable only for fatigue, restless leg, walking difficulty, dizziness, headaches  ALLERGIES: No Known Allergies  HOME MEDICATIONS: Current Outpatient Prescriptions on File Prior to Visit  Medication Sig Dispense Refill  . clonazePAM (KLONOPIN) 0.5 MG tablet Take 1 tablet (0.5 mg total) by mouth 2 (two) times daily as needed for anxiety. For anxiety  30 tablet  0  .  GARCINIA CAMBOGIA-CHROMIUM PO Take 1 tablet by mouth 4 (four) times daily - after meals and at bedtime.      Marland Kitchen HYDROcodone-acetaminophen (NORCO/VICODIN) 5-325  MG per tablet Take 1 tablet by mouth every 6 (six) hours as needed for moderate pain.       Marland Kitchen levETIRAcetam (KEPPRA) 500 MG tablet Take 500 mg by mouth 3 (three) times daily.      Marland Kitchen lisinopril (PRINIVIL,ZESTRIL) 20 MG tablet Take 20 mg by mouth every morning.      . prochlorperazine (COMPAZINE) 10 MG tablet Take 1 tablet (10 mg total) by mouth every 6 (six) hours as needed for nausea or vomiting.  30 tablet  0  . [DISCONTINUED] esomeprazole (NEXIUM) 40 MG capsule Take 40 mg by mouth daily before breakfast. Dispense as written         PAST MEDICAL HISTORY: Past Medical History  Diagnosis Date  . Fibromyalgia   . Migraine   . Hypertension   . Depression   . Anxiety   . AVM (arteriovenous malformation)   . Cerebral aneurysm   . Seizures   . Liver hemangioma     PAST SURGICAL HISTORY: Past Surgical History  Procedure Laterality Date  . Tubal ligation    . Cerebral embolization  dec 2013  . Wisdom tooth extraction  2001  . Radiation brain    . Dilitation & currettage/hystroscopy with novasure ablation N/A 08/04/2013    Procedure: DILATATION & CURETTAGE/HYSTEROSCOPY WITH NOVASURE ABLATION;  Surgeon: Annalee Genta, DO;  Location: [facility];  Service: Gynecology;  Laterality: N/A;  . Cerebral angiogram      FAMILY HISTORY: Family History  Problem Relation Age of Onset  . Bipolar disorder Mother   . Thyroid disease Maternal Grandmother   . Glaucoma Maternal Grandmother   . Aneurysm Father     SOCIAL HISTORY:  History   Social History  . Marital Status: Married    Spouse Name: [name]    Number of Children: 2  . Years of Education: college   Occupational History  . 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 Smoking status: Former Research scientist (life sciences)  . Smokeless tobacco: Never Used     Comment: Quit 2007  . Alcohol Use: 0.6 oz/week    1 Cans of beer per week     Comment: occasional  . Drug Use: No  . Sexual Activity: Yes    Birth Control/ Protection: None   Other Topics  Concern  . Not on file   Social History Narrative   Patient lives at home with her husband [name])    Patient works full time at [employer]   Both handed   Caffeine two per week          PHYSICAL EXAM   Filed Vitals:   09/09/13 0805  BP: 114/77  Pulse: 64  Height: 5\' 1"  (1.549 m)  Weight: 138 lb (62.596 kg)    Not recorded    Body mass index is 26.09 kg/(m^2).   Generalized: In no acute distress  Neck: Supple, no carotid bruits   Cardiac: Regular rate rhythm  Pulmonary: Clear to auscultation bilaterally  Musculoskeletal: No deformity  Neurological examination  Mentation: Alert oriented to time, place, history taking, and causual conversation  Cranial nerve II-XII: Pupils were equal round reactive to light. Extraocular movements were full.  Visual field were full on confrontational test. Bilateral fundi were sharp.  Facial sensation and strength were normal. Hearing  was intact to finger rubbing bilaterally. Uvula tongue midline.  Head turning and shoulder shrug and were normal and symmetric.Tongue protrusion into cheek strength was normal.  Motor: Normal tone, bulk and strength, with the exception of mild fixation of left arm on rapid alternating movements.  Sensory: Intact to fine touch, pinprick, preserved vibratory sensation, and proprioception at toes.  Coordination: Normal finger to nose, heel-to-shin bilaterally there was no truncal ataxia  Gait: Rising up from seated position without assistance, normal stance, without trunk ataxia, moderate stride, good arm swing, smooth turning, able to perform tiptoe, and heel walking without difficulty.   Romberg signs: Negative  Deep tendon reflexes: Brachioradialis 2/2, biceps 2/2, triceps 2/2, patellar 2/2, Achilles 2/2, plantar responses were flexor bilaterally.   DIAGNOSTIC DATA (LABS, IMAGING, TESTING) - I reviewed patient records, labs, notes, testing and imaging myself where  available.  Lab Results  Component Value Date   WBC 6.2 08/03/2013   HGB 12.8 08/03/2013   HCT 37.3 08/03/2013   MCV 92.1 08/03/2013   PLT 184 08/03/2013      Component Value Date/Time   NA 140 08/03/2013 1215   K 3.6* 08/03/2013 1215   CL 106 08/03/2013 1215   CO2 20 08/03/2013 1215   GLUCOSE 83 08/03/2013 1215   BUN 16 08/03/2013 1215   BUN 12.9 05/24/2013 1001   CREATININE 0.82 08/03/2013 1215   CREATININE 0.7 05/24/2013 1001   CALCIUM 9.3 08/03/2013 1215   PROT 7.6 11/19/2011 1210   ALBUMIN 4.1 11/19/2011 1210   AST 19 11/19/2011 1210   ALT 15 11/19/2011 1210   ALKPHOS 65 11/19/2011 1210   BILITOT 0.3 11/19/2011 1210   GFRNONAA >90 08/03/2013 1215   GFRAA >90 08/03/2013 1215   Lab Results  Component Value Date   CHOL 150 07/17/2009   HDL 29* 07/17/2009   LDLCALC 73 07/17/2009   TRIG 240* 07/17/2009   CHOLHDL 5.2 Ratio 07/17/2009   No results found for this basename: HGBA1C   No results found for this basename: VITAMINB12   Lab Results  Component Value Date   TSH 0.632 07/17/2009      ASSESSMENT AND PLAN  Lakasha Mcfall is a 38 y.o. female with PMHx of Right parietal AVM, bleeding to right lateral ventricle, failed endovascular embolization, status post focal radiation therapy in January 2015, continue complains of constant frequent headaches, also has a history of complex partial seizure  1, keep Topamax, titrating to 100 mg twice a day as preventative medications 2.  Pain management 3. Her headache has migraine features, when necessary Maxalt 4. laboratory evaluation for evaluation of her diffuse body aching pain, fibromyalgia 5. RTC in 3 months   Marcial Pacas, M.D. Ph.D.  [facility] [address], [place], [place] [address] [phone]

## 2013-09-10 LAB — HIV ANTIBODY (ROUTINE TESTING W REFLEX)
HIV 1/O/2 Abs-Index Value: <1 (ref <1.00)
HIV-1/HIV-2 Ab: NONREACTIVE

## 2013-09-10 LAB — THYROID PANEL WITH TSH
FREE THYROXINE INDEX: 1.7 (ref 1.2–4.9)
T3 Uptake Ratio: 29 % (ref 24–39)
T4 TOTAL: 5.7 ug/dL (ref 4.5–12.0)
TSH: 0.722 u[IU]/mL (ref 0.450–4.500)

## 2013-09-10 LAB — VITAMIN B12: VITAMIN B 12: 342 pg/mL (ref 211–946)

## 2013-09-10 LAB — FOLATE: Folate: 6.2 ng/mL (ref >3.0)

## 2013-09-10 LAB — ANA W/REFLEX IF POSITIVE: Anti Nuclear Antibody(ANA): NEGATIVE

## 2013-09-10 LAB — CK: CK TOTAL: 50 U/L (ref 24–173)

## 2013-09-10 LAB — RPR: RPR: NONREACTIVE

## 2013-09-10 LAB — C-REACTIVE PROTEIN: CRP: 0.6 mg/L (ref 0.0–4.9)

## 2013-09-12 ENCOUNTER — Telehealth: Payer: Self-pay | Admitting: *Deleted

## 2013-09-12 MED ORDER — LEVETIRACETAM 500 MG PO TABS: ORAL_TABLET | ORAL | Status: DC

## 2013-09-12 NOTE — Telephone Encounter (Signed)
I called patient. The patient was on Keppra taking 750 mg twice daily. This has made her irritable, and she will need to come off the medication. She ran out of medication Saturday. She had a seizure. I'll get her started back on the medication with a slow taper. She is being placed on lamotrigine by Dr. [name]. She is already on Topamax.

## 2013-09-12 NOTE — Telephone Encounter (Signed)
Spoke with patient and she did not go to ER on Saturday but was having chest tightening, stuttering words, headaches, was taken off her Keppra(which she thought she should have been whenned off),is not currently taking any seizure medicine.

## 2013-09-13 NOTE — Progress Notes (Signed)
Quick Note:  Called and shared normal labs with patient , she verbalized understanding ______ 

## 2013-09-19 ENCOUNTER — Other Ambulatory Visit: Payer: Self-pay | Admitting: Physical Medicine and Rehabilitation

## 2013-09-19 DIAGNOSIS — M542 Cervicalgia: Secondary | ICD-10-CM

## 2013-09-19 DIAGNOSIS — M5030 Other cervical disc degeneration, unspecified cervical region: Secondary | ICD-10-CM

## 2013-09-26 ENCOUNTER — Other Ambulatory Visit: Payer: 59

## 2013-09-28 ENCOUNTER — Ambulatory Visit
Admission: RE | Admit: 2013-09-28 | Discharge: 2013-09-28 | Disposition: A | Discharge status: Home / Self Care | Payer: 59 | Source: Ambulatory Visit | Attending: Physical Medicine and Rehabilitation | Admitting: Physical Medicine and Rehabilitation

## 2013-09-28 DIAGNOSIS — M542 Cervicalgia: Secondary | ICD-10-CM

## 2013-09-28 DIAGNOSIS — M5030 Other cervical disc degeneration, unspecified cervical region: Secondary | ICD-10-CM

## 2013-11-02 ENCOUNTER — Telehealth: Payer: Self-pay | Admitting: Neurology

## 2013-11-02 DIAGNOSIS — F3289 Other specified depressive episodes: Secondary | ICD-10-CM

## 2013-11-02 DIAGNOSIS — F329 Major depressive disorder, single episode, unspecified: Secondary | ICD-10-CM

## 2013-11-02 DIAGNOSIS — IMO0001 Reserved for inherently not codable concepts without codable children: Secondary | ICD-10-CM

## 2013-11-02 DIAGNOSIS — R569 Unspecified convulsions: Secondary | ICD-10-CM

## 2013-11-02 DIAGNOSIS — F411 Generalized anxiety disorder: Secondary | ICD-10-CM

## 2013-11-02 NOTE — Telephone Encounter (Signed)
Patient last saw Dr. [name] in June  Patient was referred to and did see Dr. Niel Hummer and did not like the care she received there. Patient would like a referral to Dr. Vira Blanco  [address] [place], [place] [address] [phone] Secondly would like a refill on hydrocodone until she sees Dr. Eugenie Birks number to call is  [phone] and okay to leave message.

## 2013-11-02 NOTE — Telephone Encounter (Signed)
Dana please hold for Dr. [name].

## 2013-11-08 ENCOUNTER — Encounter: Payer: Self-pay | Admitting: Nurse Practitioner

## 2013-11-08 ENCOUNTER — Ambulatory Visit (INDEPENDENT_AMBULATORY_CARE_PROVIDER_SITE_OTHER): Payer: 59 | Admitting: Nurse Practitioner

## 2013-11-08 VITALS — BP 93/59 | HR 76 | Ht 61.0 in | Wt 137.6 lb

## 2013-11-08 DIAGNOSIS — R51 Headache: Secondary | ICD-10-CM

## 2013-11-08 DIAGNOSIS — E669 Obesity, unspecified: Secondary | ICD-10-CM

## 2013-11-08 DIAGNOSIS — F411 Generalized anxiety disorder: Secondary | ICD-10-CM

## 2013-11-08 DIAGNOSIS — F329 Major depressive disorder, single episode, unspecified: Secondary | ICD-10-CM

## 2013-11-08 DIAGNOSIS — Z5181 Encounter for therapeutic drug level monitoring: Secondary | ICD-10-CM

## 2013-11-08 DIAGNOSIS — R519 Headache, unspecified: Secondary | ICD-10-CM

## 2013-11-08 DIAGNOSIS — Q283 Other malformations of cerebral vessels: Secondary | ICD-10-CM

## 2013-11-08 DIAGNOSIS — I671 Cerebral aneurysm, nonruptured: Secondary | ICD-10-CM

## 2013-11-08 DIAGNOSIS — Q282 Arteriovenous malformation of cerebral vessels: Secondary | ICD-10-CM

## 2013-11-08 DIAGNOSIS — R569 Unspecified convulsions: Secondary | ICD-10-CM

## 2013-11-08 DIAGNOSIS — F3289 Other specified depressive episodes: Secondary | ICD-10-CM

## 2013-11-08 MED ORDER — LAMOTRIGINE 100 MG PO TABS: ORAL_TABLET | ORAL | Status: DC

## 2013-11-08 NOTE — Patient Instructions (Signed)
PLAN: 1. keep Topamax, titrating to 100 mg twice a day as preventative medications. Continue Keppra 750 mg twice daily. 2. Increase Lamictal to 1 tab in the morning, 1.5 at night x 1week, then increase to 1.5 twice daily. 3. Sleep deprived EEG. 4. Pain management, to Dr. Dian Situ for back and neck pain their office will contact you to schedule an appt. 5. Her headache has migraine features, when necessary Maxalt. 6. Labs today, lamotrigene level 7. RTC in 1 month with Dr. [name]  Managing Seizure Triggers: Tips for Lifestyle Modification Adapted from the [place]rentiss, [facility], [place], [place] and the journal "Clinical Nursing Practice in Epilepsy", Spring 1994.  Developing plans to modify your lifestyle is an important part of seizure preparedness. It's a way that you, as a person with seizures or a parent of a child with seizures, can take charge and play an active role in your epilepsy care. The following tips are examples of what people can do to manage triggers. Some of these tips may require a change in behavior, others may be ways to adjust your environment or schedule so not everything happens at once. Before choosing tips to try, make sure you've assessed your situation and talked to your doctor and other health care professionals for their suggestions too. Please note that research on the effectiveness of many of these techniques is limited. Many of these tips are common sense suggestions or are from health care professionals and people with epilepsy as to what they have seen and tried.  Noises: People who think they are affected by noises should be sure to talk to their doctor about whether they have a form of 'reflex epilepsy' or if general noise or distraction may be a trigger in another way. People with true reflex epilepsy may respond to specific seizure medicines and should talk to their doctor. Try using earplugs or earphones,  especially in noisy or crowded places. Try listening to relaxing music or sounds, or try distracting yourself by singing or focusing on another activity.  Bright, flashing or fluorescent lights: Use polarized or tinted glasses. Use natural lighting when indoors. Focus on distant objects when riding in a car to avoid flickering lights or patterns. Avoid discos, strobe lights or flashing bulbs on holiday decorations. Use computer monitor with minimal contrast glare or use a screen filter. Consult with your doctor about other specific recommendations for computer use.  Sleep: Try to regulate sleeping habits so you have a consistent schedule and get enough sleep. Keep a log or diary of your sleep patterns, seizures and general well-being. Ask a partner or companion to record his or her observations too. Consider the following ideas to improve sleep.  . Discuss your medicine schedule with your doctor or nurse. Changing times or doses at night may help sleep. . Limit caffeine and try to avoid it after noon time or mid?afternoon at the latest. . Avoid alcohol and nicotine prior to sleep. . Limit working or studying late at night. Stop work at least one hour before bedtime to allow time to relax. . Exercise in the early evening if possible. . Take warm showers or have someone give you a back rub before bedtime to decrease muscle tension. . Try relaxation exercises before bedtime. . Limit naps and don't nap in the early evening. . If anxious or worried, talk to someone or write down your feelings before going to sleep. Put this away and deal with these worries or concerns  in the morning! . If you can't fall asleep within 15 minutes get up and do something else for 15 minutes. Then go back to bed and try again. Don't toss and turn in bed all night.  Exercise: Regular exercise is good for everyone. Pace your exercise to avoid getting too tired or hyperventilation. Avoid exercising in the  middle of the day during hot weather. Ask your doctor about any specific exercises you may need to avoid.  Hyperventilation: Try relaxation or slow breathing exercises when anxious or if you begin to hyperventilate. Pace your activity and avoid sports that may trigger hyperventilation.  Diet: Regulate meal times and patterns around sleep, activity, and medication schedules. Usually taking medicines after food or around meals makes it easier to remember them and may lessen any stomach distress from side effects of medicines. Have a well-balanced diet and eat at consistent times to avoid long periods without food. If your appetite is poor, try small frequent meals instead of skipping meals. Avoid foods and drinks that may aggravate seizures. Not everyone is sensitive to foods, but if you are, talk to your doctor about how to modify your diet. If you are following a diet specifically for your epilepsy, be sure to follow the advice of your doctor and nutritionist.  Alcohol/Drugs: Avoid recreational drugs and talk to your doctor about use of alcohol. Avoid alcohol completely if you're going through high-risk times or have recently had surgery. If you choose to drink alcohol, use 'moderation', drink slowly, and have only one or two glasses at a time. Consider carefully what you drink, avoiding 'hard liquor' or mixed drinks that may have high alcohol content. If alcohol and drugs are a problem for you, talk to your doctor and get professional help.  Hormonal changes: Both men and women may notice a cyclical pattern to their seizures. Record seizures on a calendar and track them in relation to any changes in hormones. Women who are having menstrual cycles should track their cycle days. Women who have stopped having their menses should track other symptoms or changes, while women who are pregnant should track their pregnancy too. The use of hormonal medicines, such as contraceptives or birth control pills as  well as hormonal replacement therapy, may affect seizures in some women, so record the dates and doses of these medicines.  NOTE: some seizure medicines may interfere with the effectiveness of hormonal contraceptives making unexpected or unplanned pregnancy more likely. Be sure to talk to your doctor about all contraceptive use.When seizures cluster around menses or hormone changes, women should try to modify their lifestyle so other triggers don't occur during this high-risk time. Some women may use 'as needed' medicines to help treat seizures associated with menses. Note the use of these on your calendars and seizure preparedness plan.  Illness, fever, trauma: Notify your doctor if you become ill, have a fever, injure yourself seriously, or need other medicines such as antibiotics, painkillers, or cold medicines. Some people may notice that certain medicines can trigger seizures or interfere with seizure medicines. Fevers, other illnesses and injuries may also make you more susceptible and you'll need to monitor your seizures carefully. Try to limit other triggers during these times and talk to your doctor about what medicines you can use.  Stress, anxiety, depression: Emotional stress is a common trigger for some people, and stress can be a cause and symptom of mood problems such as anxiety and depression. Track your stress level and mood in relation to your  seizures on your diary. During stressful times, consider ways to modify your lifestyle and manage stress better. . Try counseling to help cope with seizures or other problems. . Consider support groups for epilepsy, or groups for stress management, therapy, and other support. . Write down feelings in a diary on a regular basis. It helps you get feelings out, rather than hold them in, and can help you see the issues more clearly. . Use 'time-out' periods. Just like kids may need a time-out when they are overwhelmed or acting out, so too  do adults. Giving yourself a time-out allows you to take a step back from the stressor or situation and think about how best to address it. . Learn relaxation exercises, deep breathing, yoga, or other strategies that help with stress and general well-being. . Tell your doctor and nurse how you feel. The effects of stress can be harmful to your seizures, and your life. When mood changes last longer than expected, you may need help from a mental health professional too. If you feel emotionally unsafe, call your doctor or go to an emergency room to be evaluated.

## 2013-11-08 NOTE — Progress Notes (Signed)
PATIENT: Katie [name] DOB: 10/09/1975  REASON FOR VISIT: sooner follow up for headaches, h/o A/V malformation HISTORY FROM: patient  HISTORY OF PRESENT ILLNESS: Katie [name] is a 38 years old right-handed Caucasian female, is referred by her primary care physician Dr. Maurice Small from [facility] for evaluation of frequent headaches.  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 Christella Noa. Initially she was referred to [facility] endovascular embolization, super-selective WADA which she passed and subsequently had transarterial embolization in November 2013, Postsurgically, she continued to have intractable headaches, sometimes presented to the emergency room, she was given prescription of hydrocodone 5/325 mg 60 tablets each month. Repeat angiogram in January 2015 demonstrate patent right AVM, moderate fast flow subcortical deep nidus of an arteriovenous malformation involving the right parietal/occipital subcortical region, supplied by the distal posterior parietal branches of the superior division of the right middle cerebral artery, the distal pericallosal artery on the right and also from the occipital branches of the right posterior cerebral artery. Venous drainage is inferiorly and posteriorly via a prominent right internal cerebral vein associated with a moderate-sized venous aneurysm proximally, with subsequent drainage into the vein of Galen and the straight sinus. No arterial aneurysms are identified.  She underwent radiation therapy at [facility] by Dr. Tammi Klippel in January 2015: 5 Dynamic Conformal Arcs to a prescription dose of 18 Gy. ExacTrac registration was performed for each couch angle. The 85.5% isodose line was  prescribed.   Most recent MRI of brain in March 2015 there is no significant interval change in the appearance of a right parietal arteriovenous malformation. Draining veins to the vein of Galen as well as superficial draining veins are stable. There is no enhancement or evidence for hemorrhage. The dural sinuses are patent and stable. Encephalomalacia associated with the lesion is unchanged. Ventricles are of normal size. No significant extra-axial fluid collection is present. She now complains constant 3 out of 10 headaches, 3-4 times each month, it would be exacerbated to a much severe 8 out of 10 headaches, she has to take hydrocodone, never tried preventive medications in the past, she works full-time as a Educational psychologist, has mild left arm, leg heaviness, numbness, no visual change. She also continued to have spells self transient staring, confusion, suggestive of complex partial seizure last one was 2 weeks ago,  She is currently taking Keppra 750 mg twice a day.   UPDATE June 19th 2015:  She works as Educational psychologist, she kept on Emergency planning/management officer, Yesterday, she felt funny at right parietal regions, eyes, ears hurt, ringing, pounding, she did not feel herself, zoned out, this morning, she woke up with a bad headache, nauseous, she also has spells of irritability,  She has 2-3 headaches each week, lasting for days, she is taking hydrocodone 1-2 tabs prn, Dr. Christella Noa gave her rx.  UPDATE November 08, 2013 (LL): Patient returns to office for sooner revisit, is very distraught. Has had increase in headaches and feeling of fullness in her head and ears ringing similar to before AVM rupture.  She states she contacted Dr. Lacy Duverney office over week ago and they did not respond to her concerns.  She is fearful because she is unsure what is going on; fearful that AVM is getting worse or aneurysm  may burst. She lost her job as a Educational psychologist, after having several incidents of moments of unresponsiveness, staring off, not  responding when talked to. She was referred to Dr. Unice Bailey office and received occipital pain injections for headache which helped her head pain, but she was not satisfied with treatment there. Dr. Ace Gins had trialed her on pain cream which was not helpful, and also started her on doxepin which she found mild benefit with, but then was switched to imipramine which made the feeling of pressure inside her head worse and also tinnitus worse. She is no longer taking either. She also is currently taking 100 mg topiramate twice a day for headache prevention. She is also taking Keppra 750 mg twice a day and 100 mg Lamictal twice a day for seizure prevention. She continues to have episodes of strange smells and strange taste, which is some time accompanied by a feeling of numbness in her jaw and left side, which he states occurs maybe a couple times a month. She also has concerns after intercourse with orgasm she sometimes has sharp pain in right parietal region with increased feeling of pressure in her head, ears ring, which frightens her. She has mood problems, increased irritability since AVM bleed, was on Paxil for some time but felt like a "zombie"; feels as though her personality has changed since having bleed in 2012.  REVIEW OF SYSTEMS: Full 14 system review of systems performed and notable only for ear pain, ringing in ears, shortness of breath, chest tightness, chest pain, palpitations, daytime sleepiness, dizziness, headaches,  numbness, seizure, speech difficulty, anxiety.  ALLERGIES: No Known Allergies  HOME MEDICATIONS: Outpatient Prescriptions Prior to Visit  Medication Sig Dispense Refill  . clonazePAM (KLONOPIN) 0.5 MG tablet Take 1 tablet (0.5 mg total) by mouth 2 (two) times daily as needed for anxiety. For anxiety  30 tablet  0  . GARCINIA CAMBOGIA-CHROMIUM PO Take 2 tablets by mouth daily.       Marland Kitchen HYDROcodone-acetaminophen (NORCO/VICODIN) 5-325 MG per tablet Take 1 tablet by mouth every 8  (eight) hours as needed for moderate pain.  60 tablet  0  . levETIRAcetam (KEPPRA) 500 MG tablet 1 tablet twice daily for 2 weeks, then take one half tablet twice daily for 2 weeks, then take one half tablet daily for 2 weeks, then stop the medication  50 tablet  0  . lisinopril (PRINIVIL,ZESTRIL) 20 MG tablet Take 20 mg by mouth every morning.      . rizatriptan (MAXALT-MLT) 5 MG disintegrating tablet Take 1 tablet (5 mg total) by mouth as needed for migraine. May repeat in 2 hours if needed  15 tablet  12  . topiramate (TOPAMAX) 100 MG tablet One po qam and one and 1/2 po qhs  80 tablet  12  . lamoTRIgine (LAMICTAL) 100 MG tablet 1/2 po qhs xone week, then 1/2 bid xone week, then 1/2 qam, one qhs x one week, then one tab po bid  60 tablet  12   No facility-administered medications prior to visit.    PHYSICAL EXAM Filed Vitals:   11/08/13 1104  BP: 93/59  Pulse: 76  Height: 5\' 1"  (1.549 m)  Weight: 137 lb 9.6 oz (62.415 kg)   Body mass index is 26.01 kg/(m^2).   Generalized: Well developed, is tearful, emotional. Head: normocephalic and atraumatic. Oropharynx benign  Neck: Supple, no carotid bruits  Cardiac: Regular rate rhythm, no murmur  Musculoskeletal: No deformity   Neurological examination  Mentation: Alert oriented to  time, place, history taking. Follows all commands speech and language fluent Cranial nerve II-XII: Fundoscopic exam not done. Pupils were equal round reactive to light extraocular movements were full, visual field were full on confrontational test. Facial sensation and strength were normal. hearing was intact to finger rubbing bilaterally. Uvula tongue midline. head turning and shoulder shrug and were normal and symmetric.Tongue protrusion into cheek strength was normal. Motor: The motor testing reveals 5 over 5 strength of all 4 extremities. Good symmetric motor tone is noted throughout.  Sensory: Sensory testing is intact to soft touch on all 4 extremities. No  evidence of extinction is noted.  Coordination: Cerebellar testing reveals good finger-nose-finger and heel-to-shin bilaterally.  Gait and station: Gait is normal. Tandem gait is normal. Romberg is negative. Reflexes: Deep tendon reflexes are symmetric and normal bilaterally.   DIAGNOSTIC DATA (LABS, IMAGING, TESTING) - I reviewed patient records, labs, notes, testing and imaging myself where available.  Lab Results  Component Value Date   WBC 6.2 08/03/2013   HGB 12.8 08/03/2013   HCT 37.3 08/03/2013   MCV 92.1 08/03/2013   PLT 184 08/03/2013      Component Value Date/Time   NA 140 08/03/2013 1215   K 3.6* 08/03/2013 1215   CL 106 08/03/2013 1215   CO2 20 08/03/2013 1215   GLUCOSE 83 08/03/2013 1215   BUN 16 08/03/2013 1215   BUN 12.9 05/24/2013 1001   CREATININE 0.82 08/03/2013 1215   CALCIUM 9.3 08/03/2013 1215   PROT 7.6 11/19/2011 1210   ALBUMIN 4.1 11/19/2011 1210   AST 19 11/19/2011 1210   ALT 15 11/19/2011 1210   ALKPHOS 65 11/19/2011 1210   BILITOT 0.3 11/19/2011 1210   GFRNONAA >90 08/03/2013 1215   GFRAA >90 08/03/2013 1215   Lab Results  Component Value Date   VITAMINB12 342 09/09/2013   Lab Results  Component Value Date   TSH 0.722 09/09/2013    ASSESSMENT: Katie [name] is a 38 y.o. female with PMHx of Right parietal AVM, bleeding to right lateral ventricle, failed endovascular embolization, status post focal radiation therapy in January 2015, continue complains of constant frequent headaches, also has a history of complex partial seizure, having more frequent breakthrough events, currently on Lamictal, Topirimate and Keppra.   PLAN: Today's visit was an extended visit, 35 min. With discussion of etiology and triggers for partial seizures and headaches. Increase in Lamictal may prevent breakthrough seizures and help stabilize mood, if not consider SSRI or referral to Psychiatry.  1. Keep Topamax, titrating to 100 mg twice a day as headache preventative medications. Continue  Keppra 750 mg twice daily. 2. Increase Lamictal to 1 tab in the morning, 1.5 at night x 1week, then increase to 1.5 twice daily. 3. Sleep deprived EEG. 4. Pain management, to Dr. Dian Situ for back and neck pain, pain injections 5. Her headache has migraine features, when necessary Maxalt. 6. Labs today, lamotrigene level, levetiracetam, drug screen 7. RTC in 1 month with Dr. [name], call sooner as needed.  Orders Placed This Encounter  Procedures  . Drug Blood Profile 13 (MW)  . Lamotrigine level  . Levetiracetam level  . Ambulatory referral to Pain Clinic   Meds ordered this encounter  Medications  . lamoTRIgine (LAMICTAL) 100 MG tablet    Sig: Increase to  1 tab in the am ,1.5 po qhs x one week, then 1.5 tab twice daily.    Dispense:  90 tablet    Refill:  6  Order Specific Question:  Supervising Provider    Answer:  Marcial Pacas [[id number]]   Return in about 4 weeks (around 12/06/2013) for epilepsy.  Rudi Rummage LAM, MSN, FNP-BC, A/GNP-C 11/08/2013, 12:01 PM [facility] [address] [place], [place] [address] [phone]  Note: This document was prepared with digital dictation and possible smart phrase technology. Any transcriptional errors that result from this process are unintentional.

## 2013-11-09 ENCOUNTER — Other Ambulatory Visit: Payer: Self-pay | Admitting: Neurosurgery

## 2013-11-09 ENCOUNTER — Telehealth: Payer: Self-pay | Admitting: Neurology

## 2013-11-09 DIAGNOSIS — G4489 Other headache syndrome: Secondary | ICD-10-CM

## 2013-11-09 NOTE — Telephone Encounter (Signed)
Betty from [facility] calling to state that patient has [facility] [employer] and they do not take that, she is wondering if she needs to disregard the referral or if patient will be paying out of pocket. Please return call and advise.

## 2013-11-10 MED ORDER — HYDROCODONE-ACETAMINOPHEN 5-325 MG PO TABS
1.0000 | ORAL_TABLET | Freq: Three times a day (TID) | ORAL | Status: DC | PRN
Start: 2013-11-10 — End: 2013-12-06

## 2013-11-10 NOTE — Telephone Encounter (Signed)
Left message letting patient know that pain med has been refilled and referral to Dr. Royston Bake made.

## 2013-11-10 NOTE — Telephone Encounter (Signed)
Please let patient know that I have refilled her oxycodone, made refer to Dr. Royston Bake

## 2013-11-11 NOTE — Telephone Encounter (Signed)
Spoke to patient and relayed that her prescription for Hydrocodone was ready for pick up and will be at the front desk.    Also spoke to her about [facility] not accepting her insurance.  She is seeing Dr. Ace Gins and will ask her for a recommendation, and will call us back if an new pain referral needs to be made.

## 2013-11-14 ENCOUNTER — Ambulatory Visit
Admission: RE | Admit: 2013-11-14 | Discharge: 2013-11-14 | Disposition: A | Discharge status: Home / Self Care | Payer: 59 | Source: Ambulatory Visit | Attending: Neurosurgery | Admitting: Neurosurgery

## 2013-11-14 ENCOUNTER — Other Ambulatory Visit (INDEPENDENT_AMBULATORY_CARE_PROVIDER_SITE_OTHER): Payer: 59

## 2013-11-14 ENCOUNTER — Telehealth: Payer: Self-pay | Admitting: Neurology

## 2013-11-14 DIAGNOSIS — G4489 Other headache syndrome: Secondary | ICD-10-CM

## 2013-11-14 DIAGNOSIS — Z0289 Encounter for other administrative examinations: Secondary | ICD-10-CM

## 2013-11-14 NOTE — Telephone Encounter (Signed)
Patient wanted to let Dr. [name] know that her blood pressure this morning was 88/61 taken at [facility] with no blood pressure medication x1 week. She will see her primary care tomorrow.

## 2013-11-15 ENCOUNTER — Telehealth: Payer: Self-pay | Admitting: Nurse Practitioner

## 2013-11-15 LAB — SPECIMEN STATUS REPORT

## 2013-11-15 NOTE — Telephone Encounter (Signed)
Called and spoke to patient to follow back up with her. Patient stated she is ok and she will call back after she see's PCP today. If she need's [place] she will call back.

## 2013-11-15 NOTE — Telephone Encounter (Signed)
Error, please disregard.

## 2013-11-16 ENCOUNTER — Telehealth: Payer: Self-pay | Admitting: Nurse Practitioner

## 2013-11-16 ENCOUNTER — Other Ambulatory Visit: Payer: Self-pay | Admitting: Family Medicine

## 2013-11-16 DIAGNOSIS — D1803 Hemangioma of intra-abdominal structures: Secondary | ICD-10-CM

## 2013-11-16 NOTE — Telephone Encounter (Signed)
Aleshia at Dr. Jodene Nam office called to inform Jeani Hawking, NP, they do not accept patient's insurance for referral received for Pain Management.

## 2013-11-17 NOTE — Telephone Encounter (Signed)
Please advise. Thanks.  

## 2013-11-17 NOTE — Telephone Encounter (Signed)
Referral has been handled sent to [employer] office because of insurance.

## 2013-11-22 ENCOUNTER — Ambulatory Visit
Admission: RE | Admit: 2013-11-22 | Discharge: 2013-11-22 | Disposition: A | Discharge status: Home / Self Care | Payer: 59 | Source: Ambulatory Visit | Attending: Family Medicine | Admitting: Family Medicine

## 2013-11-22 DIAGNOSIS — D1803 Hemangioma of intra-abdominal structures: Secondary | ICD-10-CM

## 2013-11-24 ENCOUNTER — Other Ambulatory Visit: Payer: Self-pay | Admitting: Neurosurgery

## 2013-11-24 DIAGNOSIS — Q283 Other malformations of cerebral vessels: Secondary | ICD-10-CM

## 2013-11-25 LAB — DRUG BLOOD PROFILE 13 (MW)
Amphetamines: NEGATIVE
Barbiturates: NEGATIVE
Benzodiazepines: NEGATIVE
Cocaine Metabolite: NEGATIVE
Fentanyl: NEGATIVE
Meperidine: NEGATIVE ng/ml (ref 400–700)
Methadone: NEGATIVE
Normeperidine: NEGATIVE ng/ml
O-Desmethyltramadol: <20 ng/mL
Oxycodones: NEGATIVE
Phencyclidine: NEGATIVE
Propoxyphene: NEGATIVE
THC (Marijuana) Mtb: NEGATIVE
Tramadol: <20 ng/mL — ABNORMAL LOW (ref 100–1000)

## 2013-11-25 LAB — OPIATE CONFIRMATION RFX- UNB
6-Acetylmorphine: NEGATIVE ng/mL
Codeine: NEGATIVE ng/mL
Dihydrocodeine: NEGATIVE ng/mL
Hydrocodone: 2 ng/mL
Hydromorphone: NEGATIVE ng/mL
Morphine: NEGATIVE ng/mL
Opiate Confirmation: POSITIVE

## 2013-11-25 LAB — LAMOTRIGINE LEVEL: Lamotrigine Lvl: 6.4 ug/mL (ref 2.0–20.0)

## 2013-11-25 LAB — LEVETIRACETAM LEVEL: Levetiracetam Lvl: 27.8 ug/mL (ref 10.0–40.0)

## 2013-12-04 ENCOUNTER — Encounter: Payer: Self-pay | Admitting: Radiation Oncology

## 2013-12-04 ENCOUNTER — Ambulatory Visit
Admission: RE | Admit: 2013-12-04 | Discharge: 2013-12-04 | Disposition: A | Discharge status: Home / Self Care | Payer: 59 | Source: Ambulatory Visit | Attending: Neurosurgery | Admitting: Neurosurgery

## 2013-12-04 DIAGNOSIS — Q283 Other malformations of cerebral vessels: Secondary | ICD-10-CM

## 2013-12-04 MED ORDER — GADOBENATE DIMEGLUMINE 529 MG/ML IV SOLN
12.0000 mL | Freq: Once | INTRAVENOUS | Status: AC | PRN
  Administered 2013-12-04: 12 mL via INTRAVENOUS

## 2013-12-04 NOTE — Progress Notes (Signed)
  Radiation Oncology         [phone] ________________________________  Name: Katie [name]  MRN: [id number]  Date: 12/05/2013  DOB: 1975-12-28     [facility] Follow-Up Visit Note  CC: Jonathon Bellows, MD  Ashok Pall, MD  Diagnosis:   38 year old woman s/p SRS for a 29 mm right parietal subcortical Arteriovenous Malformation 04/14/2013 to 18 Gy  Interval Since Last Radiation:  8  months  Narrative:  No Show, to be rescheduled. _____________________________________  Sheral Apley Tammi Klippel, M.D.

## 2013-12-05 ENCOUNTER — Ambulatory Visit
Admission: RE | Admit: 2013-12-05 | Discharge: 2013-12-05 | Disposition: A | Discharge status: Home / Self Care | Payer: 59 | Source: Ambulatory Visit | Attending: Radiation Oncology | Admitting: Radiation Oncology

## 2013-12-05 ENCOUNTER — Telehealth: Payer: Self-pay | Admitting: Radiation Oncology

## 2013-12-05 DIAGNOSIS — Q282 Arteriovenous malformation of cerebral vessels: Secondary | ICD-10-CM

## 2013-12-05 NOTE — Telephone Encounter (Signed)
Received call from patient this morning wanting to cancel follow up appointment for today at 1015. She explains she had to have another MRI yesterday because of increased pressure and ringing in her ears. She states, "Dr. Christella Noa told me I have a new place on my brain." However, MRI impression is: Right parietal AVM again noted with history of glue embolization and radiation. Compared with the MRI of 05/26/2013, the patient has developed white matter hyperintensity in the right parietal lobe which does not show restricted diffusion. This may represent chronic infarction versus white matter edema related to the AVM. Radiation change considered less likely. 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 No  acute ischemic infarction. Explained Marcine Matar will contact her tomorrow to reschedule today's follow up. Patient verbalized understanding.

## 2013-12-06 ENCOUNTER — Encounter: Payer: Self-pay | Admitting: Neurology

## 2013-12-06 ENCOUNTER — Ambulatory Visit (INDEPENDENT_AMBULATORY_CARE_PROVIDER_SITE_OTHER): Payer: 59 | Admitting: Neurology

## 2013-12-06 ENCOUNTER — Ambulatory Visit: Payer: 59 | Admitting: Neurology

## 2013-12-06 VITALS — BP 122/86 | HR 70 | Ht 61.0 in | Wt 153.0 lb

## 2013-12-06 DIAGNOSIS — R51 Headache: Secondary | ICD-10-CM

## 2013-12-06 DIAGNOSIS — Q282 Arteriovenous malformation of cerebral vessels: Secondary | ICD-10-CM

## 2013-12-06 DIAGNOSIS — R519 Headache, unspecified: Secondary | ICD-10-CM

## 2013-12-06 DIAGNOSIS — Q283 Other malformations of cerebral vessels: Secondary | ICD-10-CM

## 2013-12-06 MED ORDER — BUTALBITAL-APAP-CAFFEINE 50-325-40 MG PO TABS: 1.0000 | ORAL_TABLET | Freq: Four times a day (QID) | ORAL | Status: DC | PRN

## 2013-12-06 MED ORDER — ONDANSETRON HCL 4 MG PO TABS: 4.0000 mg | ORAL_TABLET | Freq: Three times a day (TID) | ORAL | Status: DC | PRN

## 2013-12-06 MED ORDER — HYDROCODONE-ACETAMINOPHEN 5-325 MG PO TABS: 1.0000 | ORAL_TABLET | Freq: Three times a day (TID) | ORAL | Status: DC | PRN

## 2013-12-06 MED ORDER — LEVETIRACETAM 500 MG PO TABS: 500.0000 mg | ORAL_TABLET | Freq: Two times a day (BID) | ORAL | Status: DC

## 2013-12-06 NOTE — Progress Notes (Signed)
PATIENT: Katie [name] DOB: 11-06-1975  REASON FOR VISIT: sooner follow up for headaches, h/o A/V malformation HISTORY FROM: patient  HISTORY OF PRESENT ILLNESS: Katie [name] is a 38 years old right-handed Caucasian female, is referred by her primary care physician Dr. Maurice Small from [facility] for evaluation of frequent headaches.  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 Christella Noa.  Initially she was referred to [facility] endovascular embolization, super-selective WADA which she passed and subsequently had transarterial embolization in November 2013, Postsurgically, she continued to have intractable headaches, sometimes presented to the emergency room, she was given prescription of hydrocodone 5/325 mg 60 tablets each month.   Repeat angiogram at [facility] in January 2015 demonstrate patent right AVM, moderate fast flow subcortical deep nidus of an arteriovenous malformation involving the right parietal/occipital subcortical region, supplied by the distal posterior parietal branches of the superior division of the right middle cerebral artery, the distal pericallosal artery on the right and also from the occipital branches of the right posterior cerebral artery. Venous drainage is inferiorly and posteriorly via a prominent right internal cerebral vein associated with a moderate-sized venous aneurysm proximally, with subsequent drainage into the vein of Galen and the straight sinus. No arterial aneurysms are identified.   She underwent radiation therapy at [facility] by Dr. Tammi Klippel in January 2015: 5 Dynamic Conformal Arcs to a prescription dose of 18 Gy. ExacTrac registration was performed for each couch angle. The 85.5% isodose  line was prescribed.   Most recent MRI of brain in March 2015 there is no significant interval change in the appearance of a right parietal arteriovenous malformation. Draining veins to the vein of Galen as well as superficial draining veins are stable. There is no enhancement or evidence for hemorrhage. The dural sinuses are patent and stable. Encephalomalacia associated with the lesion is unchanged. Ventricles are of normal size. No significant extra-axial fluid collection is present.   She now complains constant 3 out of 10 headaches, 3-4 times each month, it would be exacerbated to a much severe 8 out of 10 headaches, she has to take hydrocodone, never tried preventive medications in the past, she works full-time as a Educational psychologist, has mild left arm, leg heaviness, numbness, no visual change. She also continued to have spells self transient staring, confusion, suggestive of complex partial seizure last one was 2 weeks ago,  She is currently taking Keppra 750 mg twice a day.   UPDATE June 19th 2015:  She works as Educational psychologist, she kept on Emergency planning/management officer, Yesterday, she felt funny at right parietal regions, eyes, ears hurt, ringing, pounding, she did not feel herself, zoned out, this morning, she woke up with a bad headache, nauseous, she also has spells of irritability,  She has 2-3 headaches each week, lasting for days, she is taking hydrocodone 1-2 tabs prn, Dr. Christella Noa gave her rx.  UPDATE Sep 15th 2015: Since last visit in August 2015, she continues to have frequent headaches, starting from right occipital area, spreading forward to become right-sided headaches, she also complains of right ear ringing, blurry vision, losing balance. She reported one to 2 seizure-like episode each week, sudden onset of left hand, arm heaviness sensation, confused, staring for few minutes, rarely has body jerking movement.  Because of that reasons, she was put on gradual titrating dose of polypharmacy treatment for  possible complex partial seizure, this including lamotrigine 100 mg one and half tablets twice a day, Keppra 500 mg twice a day, Topamax 100 mg twice a day  Lamotrigine level was 6.4, Keppra level was 27.8.  I have reviewed repeat MRI in September 13, Right parietal AVM again noted with history of glue embolization and radiation. Compared with the MRI of 05/26/2013, the patient has developed white matter hyperintensity in the right parietal lobe which does not show restricted diffusion. This may represent chronic infarction versus white matter edema related to the AVM. Radiation change considered less likely.  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 No acute ischemic infarction  REVIEW OF SYSTEMS: Full 14 system review of systems performed and notable only for ear pain, ringing in ears, shortness of breath, chest tightness, chest pain, palpitations, daytime sleepiness, dizziness, headaches,  numbness, seizure, speech difficulty, anxiety.  ALLERGIES: No Known Allergies  HOME MEDICATIONS: Outpatient Prescriptions Prior to Visit  Medication Sig Dispense Refill  . clonazePAM (KLONOPIN) 0.5 MG tablet Take 1 tablet (0.5 mg total) by mouth 2 (two) times daily as needed for anxiety. For anxiety  30 tablet  0  . GARCINIA CAMBOGIA-CHROMIUM PO Take 2 tablets by mouth daily.       Marland Kitchen HYDROcodone-acetaminophen (NORCO/VICODIN) 5-325 MG per tablet Take 1 tablet by mouth every 8 (eight) hours as needed for moderate pain.  60 tablet  0  . lamoTRIgine (LAMICTAL) 100 MG tablet Increase to  1 tab in the am ,1.5 po qhs x one week, then 1.5 tab twice daily.  90 tablet  6  . levETIRAcetam (KEPPRA) 500 MG tablet 1 tablet twice daily for 2 weeks, then take one half tablet twice daily for 2 weeks, then take one half tablet daily for 2 weeks, then stop the medication  50 tablet  0  . rizatriptan (MAXALT-MLT) 5 MG disintegrating tablet Take 1 tablet (5 mg total) by mouth as needed for migraine. May  repeat in 2 hours if needed  15 tablet  12  . topiramate (TOPAMAX) 100 MG tablet One po qam and one and 1/2 po qhs  80 tablet  12  . lisinopril (PRINIVIL,ZESTRIL) 20 MG tablet Take 20 mg by mouth every morning.       No facility-administered medications prior to visit.    PHYSICAL EXAM Filed Vitals:   12/06/13 1124  BP: 122/86  Pulse: 70  Height: 5\' 1"  (1.549 m)  Weight: 153 lb (69.4 kg)   Body mass index is 28.92 kg/(m^2).   Generalized: Well developed, is tearful, emotional. Head: normocephalic and atraumatic. Oropharynx benign  Neck: Supple, no carotid bruits  Cardiac: Regular rate rhythm, no murmur  Musculoskeletal: No deformity   Neurological examination  Mentation: Alert oriented to time, place, history taking. Follows all commands speech and language fluent Cranial nerve II-XII: Fundoscopic exam not done. Pupils were equal round reactive to light extraocular movements were full, visual field were full on confrontational test. Facial sensation and strength were normal. hearing was intact to finger rubbing bilaterally. Uvula tongue midline. head turning and shoulder shrug and were normal and symmetric.Tongue protrusion into cheek strength was normal. Motor: Mild weak grip at left hand, mild fixation of left upper extremity on rapid rotating movement Sensory: Decreased to light touch at the left upper extremity.  Coordination: Cerebellar testing reveals good finger-nose-finger and heel-to-shin bilaterally.  Gait and station: Gait  is normal. Tandem gait is normal. Romberg is negative. Reflexes: Deep tendon reflexes are symmetric and normal bilaterally.   DIAGNOSTIC DATA (LABS, IMAGING, TESTING) - I reviewed patient records, labs, notes, testing and imaging myself where available.  Lab Results  Component Value Date   WBC 6.2 08/03/2013   HGB 12.8 08/03/2013   HCT 37.3 08/03/2013   MCV 92.1 08/03/2013   PLT 184 08/03/2013      Component Value Date/Time   NA 140 08/03/2013  1215   K 3.6* 08/03/2013 1215   CL 106 08/03/2013 1215   CO2 20 08/03/2013 1215   GLUCOSE 83 08/03/2013 1215   BUN 16 08/03/2013 1215   BUN 12.9 05/24/2013 1001   CREATININE 0.82 08/03/2013 1215   CALCIUM 9.3 08/03/2013 1215   PROT 7.6 11/19/2011 1210   ALBUMIN 4.1 11/19/2011 1210   AST 19 11/19/2011 1210   ALT 15 11/19/2011 1210   ALKPHOS 65 11/19/2011 1210   BILITOT 0.3 11/19/2011 1210   GFRNONAA >90 08/03/2013 1215   GFRAA >90 08/03/2013 1215   Lab Results  Component Value Date   VITAMINB12 342 09/09/2013   Lab Results  Component Value Date   TSH 0.722 09/09/2013    ASSESSMENT: Katie [name] is a 38 y.o. female with PMHx of Right parietal AVM, bleeding to right lateral ventricle, failed endovascular embolization, status post focal radiation therapy in January 2015, continue complains of constant frequent headaches, also has a history of complex partial seizure, having more frequent breakthrough events, currently on Lamictal, Topirimate and Keppra.    1. Keep Topamax 100 mg twice a day, Keppra 500 mg twice a day, lamotrigine 100 mg one and half tablets twice a day 2. her complains of dizziness, can be related to medicine side effect, 3 repeat MRI of the brain showed extension of the right parietal lesion,  Differentiation diagnosis most consistent with chronic infarction, vs. locally edema due to AVM 4. Continue followup with neurosurgeon Dr. Christella Noa 5. Fioricet as needed for headaches, hydrocodone as needed 6 return to clinic in one to 2 months    Marcial Pacas, M.D. Ph.D.  [facility] [address] [place], [place] [address] [phone]

## 2013-12-12 ENCOUNTER — Encounter: Payer: Self-pay | Admitting: Radiation Oncology

## 2013-12-12 ENCOUNTER — Ambulatory Visit
Admission: RE | Admit: 2013-12-12 | Discharge: 2013-12-12 | Disposition: A | Discharge status: Home / Self Care | Payer: 59 | Source: Ambulatory Visit | Attending: Radiation Oncology | Admitting: Radiation Oncology

## 2013-12-12 VITALS — BP 109/76 | HR 64 | Temp 98.1°F | Resp 12 | Wt 136.6 lb

## 2013-12-12 DIAGNOSIS — Q282 Arteriovenous malformation of cerebral vessels: Secondary | ICD-10-CM

## 2013-12-12 NOTE — Addendum Note (Signed)
Encounter addended by: Lora Paula, MD on: 12/12/2013 11:39 AM<BR>     Documentation filed: Notes Section

## 2013-12-12 NOTE — Progress Notes (Signed)
  Radiation Oncology         [phone]) [phone] ________________________________  Name: Katie [name]  MRN: [id number]  Date: 12/12/2013  DOB: 1975/10/26  Chart Note:  This patient was here for an appointment with me today.  Please excuse any absence.  ________________________________  Sheral Apley. Tammi Klippel, M.D.

## 2013-12-12 NOTE — Progress Notes (Signed)
She rates her pain as a 3 on a scale of 0-10. Pt complains of, Generalized Weakness and Poor Appetite.  Pt alert & oriented x 3 with fluent speech, noted slight weakness over upper and lower extremities, pt reports her gait is unsteady at times. Pt reports negative for visual blurring occasionally, positive for floaters both eyes, reports she occasionally taste blood, possibly from her sinuses, PERRLA, fluctuating and recurrent ringing sound in ears mainly in Right ear. Pt presenting appropriate quality, quantity and organization of sentences. Pt reports dull pain, sharp pain headaches over the top of her head that radiates to her neck.  She reports there has times when she is laying in bed and needs to get her husbands attention and she is unable to because the left side of her body goes numb.  She reports she had an approx. One minute long seizure this morning at 5:30am, she was not injured.  She reports occasionally after intercourse she will have seizures which cause her to have facial and left sided body numbness.   The patient does not eat regular meals due to lack of appetite. .She has lost approx 17lbs since last seen.

## 2013-12-12 NOTE — Progress Notes (Signed)
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  She rates her pain as a 3 on a scale of 0-10. Pt complains of, Generalized Weakness and Poor Appetite. Pt alert & oriented x 3 with fluent speech, noted slight weakness over upper and lower extremities, pt reports her gait is unsteady at times. Pt reports negative for visual blurring occasionally, positive for floaters both eyes, reports she occasionally taste blood, possibly from her sinuses, PERRLA, fluctuating and recurrent ringing sound in ears mainly in Right ear. Pt presenting appropriate quality, quantity and organization of sentences. Pt reports dull pain, sharp pain headaches over the top of her head that radiates to her neck. She reports there has times when she is laying in bed and needs to get her husbands attention and she is unable to because the left side of her body goes numb. She reports she had an approx. One minute long seizure this morning at 5:30am, she was not injured. She reports occasionally after intercourse she will have seizures which cause her to have facial and left sided body numbness.  The patient does not eat regular meals due to lack of appetite. .She has lost approx 17lbs since last seen                               ALLERGIES:  has No Known Allergies.  Meds: Current Outpatient Prescriptions  Medication Sig Dispense Refill  . butalbital-acetaminophen-caffeine (FIORICET) 50-325-40 MG per tablet Take 1-2 tablets by mouth every 6 (six) hours as needed for headache.  20 tablet  0  . clonazePAM (KLONOPIN) 0.5 MG tablet Take  1 tablet (0.5 mg total) by mouth 2 (two) times daily as needed for anxiety. For anxiety  30 tablet  0  . GARCINIA CAMBOGIA-CHROMIUM PO Take 2 tablets by mouth daily.       Marland Kitchen HYDROcodone-acetaminophen (NORCO/VICODIN) 5-325 MG per tablet Take 1 tablet by mouth every 8 (eight) hours as needed for moderate pain.  60 tablet  0  . lamoTRIgine (LAMICTAL) 100 MG tablet Increase to  1 tab in the am ,1.5 po qhs x one week, then 1.5 tab twice daily.  90 tablet  6  . levETIRAcetam (KEPPRA) 500 MG tablet Take 1 tablet (500 mg total) by mouth 2 (two) times daily.  60 tablet  11  . ondansetron (ZOFRAN) 4 MG tablet Take 1 tablet (4 mg total) by mouth every 8 (eight) hours as needed for nausea or vomiting.  20 tablet  0  . topiramate (TOPAMAX) 100 MG tablet One po qam and one and 1/2 po qhs  80 tablet  12  . [DISCONTINUED] esomeprazole (NEXIUM) 40 MG capsule Take 40 mg by mouth daily before breakfast. Dispense as written       No current facility-administered medications for this encounter.    Physical Findings: The patient is in no acute distress. Patient is alert and oriented.  weight is 136 lb 9.6 oz (61.961 kg). Her oral temperature is 98.1 F (36.7 C). Her blood pressure  is 109/76 and her pulse is 64. Her respiration is 12 and oxygen saturation is 100%. .  No significant changes.  Lab Findings: Lab Results  Component Value Date   WBC 6.2 08/03/2013   HGB 12.8 08/03/2013   HCT 37.3 08/03/2013   MCV 92.1 08/03/2013   PLT 184 08/03/2013    @LASTCHEM @  Radiographic Findings: Ct Head Wo Contrast  11/14/2013   CLINICAL DATA:  Status post AVM embolization.  Recent headaches.  EXAM: CT HEAD WITHOUT CONTRAST  TECHNIQUE: Contiguous axial images were obtained from the base of the skull through the vertex without contrast.  COMPARISON:  MR head 05/26/2013.  CT head 12/06/2012.  FINDINGS: Radiodense material is redemonstrated throughout the vasculature comprising the previously high flow arteriovenous malformation  in the RIGHT parietal lobe. The pattern of the embolic material has not changed from previously. There is no new acute hemorrhage, and no extra-axial fluid.  In the RIGHT posterior frontal periventricular white matter (image 16 series 2), there is a new ovoid 5 x 15 mm cross-section area of hypoattenuation not definitely seen on prior MR or CT suggesting possible infarct or resolving subacute to chronic hemorrhage. MRI brain recommended for further evaluation.  Calvarium intact.  Clear sinuses and mastoids.  IMPRESSION: Status Post RIGHT parietal AVM embolization as described. New area of hypoattenuation in the RIGHT posterior frontal periventricular white matter which could represent a possible infarct or resolving hemorrhage. Recommend MRI brain.   Electronically Signed   By: Rolla Flatten M.D.   On: 11/14/2013 15:04   Mr Jeri Cos OZ Contrast  12/04/2013   CLINICAL DATA:  History of AVM with radiation and embolization. Abnormal CT.  EXAM: MRI HEAD WITHOUT AND WITH CONTRAST  TECHNIQUE: Multiplanar, multiecho pulse sequences of the brain and surrounding structures were obtained without and with intravenous contrast.  CONTRAST:  55mL MULTIHANCE GADOBENATE DIMEGLUMINE 529 MG/ML IV SOLN  COMPARISON:  CT head 11/14/2013, MRI 05/26/2013  FINDINGS: Right parietal AVM again noted with evidence of prior glue embolization. 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 Compared with the MRI of 05/26/2013, there has developed an area of increased signal in the white matter deep to the infarction extending down to the atrium of the lateral ventricle on the right. There is white matter hyperintensity lateral and superior to the atrium, corresponding to the CT abnormality. This may represent edema or chronic infarction. This area does not show abnormal enhancement. Radiation change not considered likely given the time sequence and lack of enhancement.  Dilated draining vein  into the choroid plexus on the right again noted and unchanged in size. This drains into the vein of Galen. There also appear to be some superficial draining veins in the right parietal lobe unchanged.  Diffusion-weighted imaging negative for acute infarct.  No enhancing lesions are seen following contrast infusion. No evidence of neoplasm. Ventricle size remains normal. No shift of the midline structures.  Mucosal edema right maxillary sinus  IMPRESSION: Right parietal AVM again noted with history of glue embolization and radiation. Compared with the MRI of 05/26/2013, the patient has developed white matter hyperintensity in the right parietal lobe which does not show restricted diffusion. This may represent chronic infarction versus white matter edema related to the AVM. Radiation change considered less likely.  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 No acute ischemic infarction.   Electronically Signed   By: Franchot Gallo M.D.  On: 12/04/2013 16:43   US Abdomen Limited Ruq  11/22/2013   CLINICAL DATA:  Hemangioma  EXAM: US ABDOMEN LIMITED - RIGHT UPPER QUADRANT  COMPARISON:  02/19/2010 abdominal MRI  FINDINGS: Gallbladder:  No gallstones or wall thickening visualized. No sonographic Murphy sign noted.  Common bile duct:  Diameter: 4 mm in diameter within normal limits.  Liver:  The liver shows normal echogenicity. Again noted probable hemangioma adjacent to gallbladder fossa measures 9 x 8 mm.  IMPRESSION: 1. No gallstones are noted within gallbladder.  Normal CBD. 2. Stable hemangioma adjacent to gallbladder measures 9 x 8 mm. No other hemangiomas are identified.   Electronically Signed   By: Lahoma Crocker M.D.   On: 11/22/2013 10:05    Impression:  The patient is recovering from the effects of radiation.    Plan:  We spent some time today reviewing her recent CT scan and MRI. There had been a question of a 15 mm lesion on her CT scan. I presented this to her today describing that  the lesion in fact represent a day and ischemic appearing area which appeared to correlate with flair changes on MRI potentially representing chronic infarction caused by her AVM.   Follow-up in 6 months.  _____________________________________  Sheral Apley. Tammi Klippel, M.D.

## 2013-12-15 ENCOUNTER — Ambulatory Visit: Payer: 59 | Admitting: Nurse Practitioner

## 2013-12-26 ENCOUNTER — Other Ambulatory Visit: Payer: Self-pay | Admitting: Physical Medicine and Rehabilitation

## 2013-12-26 ENCOUNTER — Ambulatory Visit
Admission: RE | Admit: 2013-12-26 | Discharge: 2013-12-26 | Disposition: A | Discharge status: Home / Self Care | Payer: 59 | Source: Ambulatory Visit | Attending: Physical Medicine and Rehabilitation | Admitting: Physical Medicine and Rehabilitation

## 2013-12-26 DIAGNOSIS — M25552 Pain in left hip: Principal | ICD-10-CM

## 2013-12-26 DIAGNOSIS — M25551 Pain in right hip: Secondary | ICD-10-CM

## 2014-01-05 ENCOUNTER — Encounter: Payer: Self-pay | Admitting: Neurology

## 2014-01-05 ENCOUNTER — Ambulatory Visit (INDEPENDENT_AMBULATORY_CARE_PROVIDER_SITE_OTHER): Payer: 59 | Admitting: Neurology

## 2014-01-05 ENCOUNTER — Telehealth: Payer: Self-pay | Admitting: Neurology

## 2014-01-05 VITALS — BP 117/84 | HR 75 | Ht 61.0 in | Wt 135.0 lb

## 2014-01-05 DIAGNOSIS — R519 Headache, unspecified: Secondary | ICD-10-CM

## 2014-01-05 DIAGNOSIS — I671 Cerebral aneurysm, nonruptured: Secondary | ICD-10-CM

## 2014-01-05 DIAGNOSIS — R51 Headache: Secondary | ICD-10-CM

## 2014-01-05 DIAGNOSIS — R569 Unspecified convulsions: Secondary | ICD-10-CM

## 2014-01-05 MED ORDER — TOPIRAMATE 100 MG PO TABS: 100.0000 mg | ORAL_TABLET | Freq: Two times a day (BID) | ORAL | Status: DC

## 2014-01-05 MED ORDER — LEVETIRACETAM 1000 MG PO TABS: 1000.0000 mg | ORAL_TABLET | Freq: Two times a day (BID) | ORAL | Status: DC

## 2014-01-05 NOTE — Telephone Encounter (Signed)
I called the pharmacy.  Spoke with Estill Bamberg.  She said they do have the Rx, however it is saved on file because it is too soon to fill.  They state the patient picked up 30 day supply on 10/01.  I called the patient back.  She is aware.

## 2014-01-05 NOTE — Telephone Encounter (Signed)
Patient stated [facility], [place] stated they hadn't received Rx request for topiramate (TOPAMAX) 100 MG tablet.  Please call and advise.

## 2014-01-05 NOTE — Progress Notes (Signed)
PATIENT: Katie [name] DOB: [birth year]  REASON FOR VISIT: sooner follow up for headaches, h/o A/V malformation HISTORY FROM: patient  HISTORY OF PRESENT ILLNESS: Katie [name] is a 38 years old right-handed Caucasian female, is referred by her primary care physician Dr. Maurice Small from [place] practice for evaluation of frequent headaches.  She had a gradual onset of headache since 2012, in November 18 2011, she sudden loss of consciousness, suffered a seizure, was taken to the [facility], CAT scan has demonstrated, right parietal AVM malformation, bleeding into her right lateral ventricle she was under the care neurosurgeon Dr. Christella Noa.  Initially she was referred to [facility] endovascular embolization, super-selective WADA which she passed and subsequently had transarterial embolization in November 2013, Postsurgically, she continued to have intractable headaches, sometimes presented to the emergency room, she was given prescription of hydrocodone 5/325 mg 60 tablets each month.   Repeat angiogram at [facility] in January 2015 demonstrate patent right AVM, moderate fast flow subcortical deep nidus of an arteriovenous malformation involving the right parietal/occipital subcortical region, supplied by the distal posterior parietal branches of the superior division of the right middle cerebral artery, the distal pericallosal artery on the right and also from the occipital branches of the right posterior cerebral artery. Venous drainage is inferiorly and posteriorly via a prominent right internal cerebral vein associated with a moderate-sized venous aneurysm proximally, with subsequent drainage into the vein of Galen and the straight sinus. No arterial aneurysms are identified.   She underwent radiation therapy at [facility] by Dr. Tammi Klippel in January 2015: 5 Dynamic Conformal Arcs to a prescription dose of 18 Gy. ExacTrac registration was performed for each couch angle. The 85.5% isodose  line was prescribed.   Most recent MRI of brain in March 2015 there is no significant interval change in the appearance of a right parietal arteriovenous malformation. Draining veins to the vein of Galen as well as superficial draining veins are stable. There is no enhancement or evidence for hemorrhage. The dural sinuses are patent and stable. Encephalomalacia associated with the lesion is unchanged. Ventricles are of normal size. No significant extra-axial fluid collection is present.   She now complains constant 3 out of 10 headaches, 3-4 times each month, it would be exacerbated to a much severe 8 out of 10 headaches, she has to take hydrocodone, never tried preventive medications in the past, she works full-time as a Educational psychologist, has mild left arm, leg heaviness, numbness, no visual change. She also continued to have spells self transient staring, confusion, suggestive of complex partial seizure last one was 2 weeks ago,  She is currently taking Keppra 750 mg twice a day.   UPDATE June 19th 2015:  She works as Educational psychologist, she kept on Emergency planning/management officer, Yesterday, she felt funny at right parietal regions, eyes, ears hurt, ringing, pounding, she did not feel herself, zoned out, this morning, she woke up with a bad headache, nauseous, she also has spells of irritability,  She has 2-3 headaches each week, lasting for days, she is taking hydrocodone 1-2 tabs prn, Dr. Christella Noa gave her rx.  UPDATE Sep 15th 2015: Since last visit in August 2015, she continues to have frequent headaches, starting from right occipital area, spreading forward to become right-sided headaches, she also complains of right ear ringing, blurry vision, losing balance. She reported one to 2 seizure-like episode each week, sudden onset of left hand, arm heaviness sensation, confused, staring for few minutes, rarely has body jerking movement.  Because of that reasons, she was put on gradual titrating dose of polypharmacy treatment for  possible complex partial seizure, this including lamotrigine 100 mg one and half tablets twice a day, Keppra 500 mg twice a day, Topamax 100 mg twice a day  Lamotrigine level was 6.4, Keppra level was 27.8.  I have reviewed repeat MRI in September 13, Right parietal AVM again noted with history of glue embolization and radiation. Compared with the MRI of 05/26/2013, the patient has developed white matter hyperintensity in the right parietal lobe which does not show restricted diffusion. This may represent chronic infarction versus white matter edema related to the AVM. Radiation change considered less likely.  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 No acute ischemic infarction  UPDATE Oct 15th 2015: She is taking Topamax 100mg  bid, keppra 500mg  tid, she has wean her self off lamotrigine since October eighth 2015, also dizziness, nauseousness, she felt less nause, feel better,   she still has headaches, vertex, right occipital region, shooting up to right parietal region.  She still has seizure like event, 2 weeks ago, at the end of Sept 2015, she was noticed by her husband that her hand was jerking,locked up, her head dropped down, eyes closed.   REVIEW OF SYSTEMS: Full 14 system review of systems performed and notable only for fatigue, facial swelling, ringing in ears, chest tightness, nauseous, joint pain, achy muscles, low back pain, neck pain, neck stiffness, memory loss, headaches, numbness, seizure, speech difficulty, weakness, behavior problem, confusion, depression, anxiety, hyperactivity.  ALLERGIES: No Known Allergies  HOME MEDICATIONS: Outpatient Prescriptions Prior to Visit  Medication Sig Dispense Refill  . butalbital-acetaminophen-caffeine (FIORICET) 50-325-40 MG per tablet Take 1-2 tablets by mouth every 6 (six) hours as needed for headache.  20 tablet  0  . clonazePAM (KLONOPIN) 0.5 MG tablet Take 1 tablet (0.5 mg total) by mouth 2 (two) times daily as  needed for anxiety. For anxiety  30 tablet  0  . GARCINIA CAMBOGIA-CHROMIUM PO Take 2 tablets by mouth daily.       Marland Kitchen HYDROcodone-acetaminophen (NORCO/VICODIN) 5-325 MG per tablet Take 1 tablet by mouth every 8 (eight) hours as needed for moderate pain.  60 tablet  0  . lamoTRIgine (LAMICTAL) 100 MG tablet Increase to  1 tab in the am ,1.5 po qhs x one week, then 1.5 tab twice daily.  90 tablet  6  . levETIRAcetam (KEPPRA) 500 MG tablet Take 1 tablet (500 mg total) by mouth 2 (two) times daily.  60 tablet  11  . ondansetron (ZOFRAN) 4 MG tablet Take 1 tablet (4 mg total) by mouth every 8 (eight) hours as needed for nausea or vomiting.  20 tablet  0  . topiramate (TOPAMAX) 100 MG tablet One po qam and one and 1/2 po qhs  80 tablet  12   No facility-administered medications prior to visit.    PHYSICAL EXAM Filed Vitals:   01/05/14 0802  BP: 117/84  Pulse: 75  Height: 5\' 1"  (1.549 m)  Weight: 135 lb (61.236 kg)   Body mass index is 25.52 kg/(m^2).   Generalized: Well developed, is tearful, emotional. Head: normocephalic and atraumatic. Oropharynx benign  Neck: Supple, no carotid bruits  Cardiac: Regular rate rhythm, no murmur  Musculoskeletal: No deformity   Neurological examination  Mentation: Alert oriented to time, place, history taking. Follows all commands speech and language fluent Cranial nerve II-XII: Fundoscopic exam not done. Pupils were equal round reactive to light extraocular  movements were full, visual field were full on confrontational test. Facial sensation and strength were normal. hearing was intact to finger rubbing bilaterally. Uvula tongue midline. head turning and shoulder shrug and were normal and symmetric.Tongue protrusion into cheek strength was normal. Motor: Mild weak grip at left hand, mild fixation of left upper extremity on rapid rotating movement Sensory: Decreased to light touch at the left upper extremity.  Coordination: Cerebellar testing reveals good  finger-nose-finger and heel-to-shin bilaterally.  Gait and station: Gait is normal. Tandem gait is normal. Romberg is negative. Reflexes: Deep tendon reflexes are symmetric and normal bilaterally.   DIAGNOSTIC DATA (LABS, IMAGING, TESTING) - I reviewed patient records, labs, notes, testing and imaging myself where available.  Lab Results  Component Value Date   WBC 6.2 08/03/2013   HGB 12.8 08/03/2013   HCT 37.3 08/03/2013   MCV 92.1 08/03/2013   PLT 184 08/03/2013      Component Value Date/Time   NA 140 08/03/2013 1215   K 3.6* 08/03/2013 1215   CL 106 08/03/2013 1215   CO2 20 08/03/2013 1215   GLUCOSE 83 08/03/2013 1215   BUN 16 08/03/2013 1215   BUN 12.9 05/24/2013 1001   CREATININE 0.82 08/03/2013 1215   CALCIUM 9.3 08/03/2013 1215   PROT 7.6 11/19/2011 1210   ALBUMIN 4.1 11/19/2011 1210   AST 19 11/19/2011 1210   ALT 15 11/19/2011 1210   ALKPHOS 65 11/19/2011 1210   BILITOT 0.3 11/19/2011 1210   GFRNONAA >90 08/03/2013 1215   GFRAA >90 08/03/2013 1215   Lab Results  Component Value Date   VITAMINB12 342 09/09/2013   Lab Results  Component Value Date   TSH 0.722 09/09/2013    ASSESSMENT: Anslie Spadafora is a 38 y.o. female with PMHx of Right parietal AVM, bleeding to right lateral ventricle, failed endovascular embolization, status post focal radiation therapy in January 2015, continue complains of constant frequent headaches, also has a history of complex partial seizure, having more frequent breakthrough events, currently on Lamictal, Topirimate and Keppra.    1. Keep Topamax 100 mg twice a day, increase Keppra to 1000 mg twice a day, she has wean her self off lamotrigine 100 mg one and half tablets twice a day without advise. 2. her complains of dizziness, can be related to medicine side effect, 3 repeat MRI of the brain showed extension of the right parietal lesion,  Differentiation diagnosis most consistent with chronic infarction, vs. locally edema due to AVM 4 Fioricet as needed  for headaches, hydrocodone as needed 5. return to clinic in one to 2 months    Marcial Pacas, M.D. Ph.D.  [facility] [address], [place], [place] [address] [phone]

## 2014-01-11 ENCOUNTER — Telehealth: Payer: Self-pay | Admitting: Neurology

## 2014-01-11 MED ORDER — LEVETIRACETAM 1000 MG PO TABS: ORAL_TABLET | ORAL | Status: DC

## 2014-01-11 NOTE — Telephone Encounter (Signed)
I called and spoke with the patient to remind her of her EEG appt.on 01/13/14. Patient stated she wants Dr. [name] to know that she had 2 seizures on Wednesday (01/04/14) and 1 seizure on Thursday(01/05/14).

## 2014-01-11 NOTE — Telephone Encounter (Signed)
I have called Katie [name],   She has two seizures, Oct 14th 2015, Oct 15th 2015, she started feeling funny, biting down on her teeth. Left arm twitching.  Increase keppra 1000mg  to, one and 1/2 bid. Called in [place] [facility].  Keep EEG appt in Oct 23rd, document all event

## 2014-01-13 ENCOUNTER — Ambulatory Visit (INDEPENDENT_AMBULATORY_CARE_PROVIDER_SITE_OTHER): Payer: 59

## 2014-01-13 DIAGNOSIS — R519 Headache, unspecified: Secondary | ICD-10-CM

## 2014-01-13 DIAGNOSIS — R51 Headache: Secondary | ICD-10-CM

## 2014-01-13 DIAGNOSIS — I671 Cerebral aneurysm, nonruptured: Secondary | ICD-10-CM

## 2014-01-13 DIAGNOSIS — R569 Unspecified convulsions: Secondary | ICD-10-CM

## 2014-01-17 NOTE — Procedures (Signed)
   HISTORY: 38 year old female, with history of right AVM, now presenting with frequent seizure    TECHNIQUE:  16 channel EEG was performed based on standard 10-16 international system. One channel was dedicated to EKG, which has demonstrates sinus rhythm of 54 beats per minutes.  Upon awakening, the posterior background activity was well-developed, in alpha range, with frequent bilateral frontal dominant smaller amplitude beta range activity, reactive to eye opening and closure.  There was no evidence of epileptiform discharge.  Photic stimulation was performed, which induced a symmetric photic driving. There was frequent electrode artifact    Hyperventilation was performed, there was no abnormality elicit.   stage II sleep was achieved.  CONCLUSION: This is a  normal awake and asleep EEG.  There is no electrodiagnostic evidence of epileptiform discharge

## 2014-01-23 ENCOUNTER — Encounter: Payer: Self-pay | Admitting: Neurology

## 2014-01-27 ENCOUNTER — Telehealth: Payer: Self-pay | Admitting: Neurology

## 2014-01-27 ENCOUNTER — Encounter: Payer: Self-pay | Admitting: Neurology

## 2014-01-27 NOTE — Telephone Encounter (Signed)
Called patient LVM and mailed letter with rescheduled appointment from 11/18 @ 8:00 am to 11/19 @ 11:30 am.

## 2014-02-02 ENCOUNTER — Telehealth: Payer: Self-pay | Admitting: Neurology

## 2014-02-02 NOTE — Telephone Encounter (Signed)
She has appt in Nov 19th, will address her questions then

## 2014-02-02 NOTE — Telephone Encounter (Signed)
Called and left patient a message to call the office back. I will forward message to Dr.Yan.

## 2014-02-02 NOTE — Telephone Encounter (Signed)
Patient calling to state that she was just woken up by her father to remind her of her appointment, patient calling to report that she had a seizure on Saturday night and headaches as well, along with pressure in her head and not feeling like herself. Please return call and advise.

## 2014-02-08 ENCOUNTER — Ambulatory Visit: Payer: 59 | Admitting: Neurology

## 2014-02-09 ENCOUNTER — Ambulatory Visit (INDEPENDENT_AMBULATORY_CARE_PROVIDER_SITE_OTHER): Payer: 59 | Admitting: Neurology

## 2014-02-09 ENCOUNTER — Encounter: Payer: Self-pay | Admitting: Neurology

## 2014-02-09 VITALS — BP 109/74 | HR 57 | Ht 61.0 in | Wt 139.0 lb

## 2014-02-09 DIAGNOSIS — Q282 Arteriovenous malformation of cerebral vessels: Secondary | ICD-10-CM

## 2014-02-09 DIAGNOSIS — Q283 Other malformations of cerebral vessels: Secondary | ICD-10-CM

## 2014-02-09 DIAGNOSIS — I671 Cerebral aneurysm, nonruptured: Secondary | ICD-10-CM

## 2014-02-09 DIAGNOSIS — R569 Unspecified convulsions: Secondary | ICD-10-CM

## 2014-02-09 MED ORDER — BUTALBITAL-APAP-CAFFEINE 50-325-40 MG PO TABS: 1.0000 | ORAL_TABLET | Freq: Four times a day (QID) | ORAL | Status: DC | PRN

## 2014-02-09 MED ORDER — LAMOTRIGINE 25 & 50 & 100 MG PO KIT: PACK | ORAL | Status: DC

## 2014-02-09 MED ORDER — VENLAFAXINE HCL ER 37.5 MG PO CP24: ORAL_CAPSULE | ORAL | Status: DC

## 2014-02-09 MED ORDER — LAMOTRIGINE 100 MG PO TABS: ORAL_TABLET | ORAL | Status: DC

## 2014-02-09 NOTE — Patient Instructions (Signed)
Tapering off Keppra,   You are taking keppra 1000mg  one and 1/2 tab twice a day,   1st week,  1/ 1.5 2nd week  1/1 3rd week   0.5/1 4th week,  0.5/0.5 5th week 1/0.5 6 th week 0/0  Titrating up Lamotrigine to 100mg  1 and 1/2 tab twice a day

## 2014-02-09 NOTE — Progress Notes (Addendum)
PATIENT: Katie [name] DOB: 05/13/1975  REASON FOR VISIT: sooner follow up for headaches, h/o A/V malformation HISTORY FROM: patient  HISTORY OF PRESENT ILLNESS: Katie [name] is a 38 years old right-handed Caucasian female, is referred by her primary care physician Dr. Maurice Small from [place] practice for evaluation of frequent headaches.  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 Christella Noa.  Initially she was referred to [facility] endovascular embolization, super-selective WADA which she passed and subsequently had transarterial embolization in November 2013, Postsurgically, she continued to have intractable headaches, sometimes presented to the emergency room, she was given prescription of hydrocodone 5/325 mg 60 tablets each month.   Repeat angiogram at [facility] in January 2015 demonstrate patent right AVM, moderate fast flow subcortical deep nidus of an arteriovenous malformation involving the right parietal/occipital subcortical region, supplied by the distal posterior parietal branches of the superior division of the right middle cerebral artery, the distal pericallosal artery on the right and also from the occipital branches of the right posterior cerebral artery. Venous drainage is inferiorly and posteriorly via a prominent right internal cerebral vein associated with a moderate-sized venous aneurysm proximally, with subsequent drainage into the vein of Galen and the straight sinus. No arterial aneurysms are identified.   She underwent radiation therapy at [facility] by Dr. Tammi Klippel in January 2015: 5 Dynamic Conformal Arcs to a prescription dose of 18 Gy. ExacTrac registration was performed for each couch angle. The 85.5% isodose  line was prescribed.   Most recent MRI of brain in March 2015 there is no significant interval change in the appearance of a right parietal arteriovenous malformation. Draining veins to the vein of Galen as well as superficial draining veins are stable. There is no enhancement or evidence for hemorrhage. The dural sinuses are patent and stable. Encephalomalacia associated with the lesion is unchanged. Ventricles are of normal size. No significant extra-axial fluid collection is present.   She now complains constant 3 out of 10 headaches, 3-4 times each month, it would be exacerbated to a much severe 8 out of 10 headaches, she has to take hydrocodone, never tried preventive medications in the past, she works full-time as a Educational psychologist, has mild left arm, leg heaviness, numbness, no visual change. She also continued to have spells self transient staring, confusion, suggestive of complex partial seizure last one was 2 weeks ago,  She is currently taking Keppra 750 mg twice a day.   UPDATE June 19th 2015:  She works as Educational psychologist, she kept on Emergency planning/management officer, Yesterday, she felt funny at right parietal regions, eyes, ears hurt, ringing, pounding, she did not feel herself, zoned out, this morning, she woke up with a bad headache, nauseous, she also has spells of irritability,  She has 2-3 headaches each week, lasting for days, she is taking hydrocodone 1-2 tabs prn, Dr. Christella Noa gave her rx.  UPDATE Sep 15th 2015: Since last visit in August 2015, she continues to have frequent headaches, starting from right occipital area, spreading forward to become right-sided headaches, she also complains of right ear ringing, blurry vision, losing balance. She reported one to 2 seizure-like episode each week, sudden onset of left hand, arm heaviness sensation, confused, staring for few minutes, rarely has body jerking movement.  Because of that reasons, she was put on gradual titrating dose of polypharmacy treatment for  possible complex partial seizure, this including lamotrigine 100 mg one and half tablets twice a day, Keppra 500 mg twice a day, Topamax 100 mg twice a day  Lamotrigine level was 6.4, Keppra level was 27.8.  I have reviewed repeat MRI in December 04 2013, Right parietal AVM again noted with history of glue embolization and radiation. Compared with the MRI of 05/26/2013, the patient has developed white matter hyperintensity in the right parietal lobe which does not show restricted diffusion. This may represent chronic infarction versus white matter edema related to the AVM. Radiation change considered less likely.  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 No acute ischemic infarction  UPDATE Oct 15th 2015: She is taking Topamax 100mg  bid, keppra 500mg  tid, she has wean her self off lamotrigine since October eighth 2015, because of dizziness, nauseousness, she felt less nause, feel better,   she still has headaches, vertex, right occipital region, shooting up to right parietal region.  She still has seizure like event, 2 weeks ago, at the end of Sept 2015, she was noticed by her husband that her hand was jerking,locked up, her head dropped down, eyes closed.   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 Cyndy Freeze in April 08 2013   REVIEW OF SYSTEMS: Full 14 system review of systems  performed and notable only for as above  ALLERGIES: No Known Allergies  HOME MEDICATIONS: Outpatient Prescriptions Prior to Visit  Medication Sig Dispense Refill  . butalbital-acetaminophen-caffeine (FIORICET) 50-325-40 MG per tablet Take 1-2 tablets by mouth every 6 (six) hours as needed for headache. 20 tablet 0  . clonazePAM (KLONOPIN) 0.5 MG tablet Take 1 tablet (0.5 mg total) by mouth 2 (two) times daily as needed for anxiety. For anxiety 30 tablet 0  . GARCINIA CAMBOGIA-CHROMIUM PO Take 2 tablets by mouth daily.     Marland Kitchen HYDROcodone-acetaminophen (NORCO/VICODIN) 5-325 MG per tablet Take 1 tablet by mouth every 8 (eight) hours as needed for moderate pain. 60 tablet 0  . levETIRAcetam (KEPPRA) 1000 MG tablet One and 1/2 tab po bid 90 tablet 11  . ondansetron (ZOFRAN) 4 MG tablet Take 1 tablet (4 mg total) by mouth every 8 (eight) hours as needed for nausea or vomiting. 20 tablet 0  . topiramate (TOPAMAX) 100 MG tablet Take 1 tablet (100 mg total) by mouth 2 (two) times daily. 60 tablet 11   No facility-administered medications prior to visit.    PHYSICAL EXAM Filed Vitals:   02/09/14 1121  BP: 109/74  Pulse: 57  Height: 5\' 1"  (1.549 m)  Weight: 139 lb (63.05 kg)   Body mass index is 26.28 kg/(m^2).   Generalized: Well developed, is tearful, emotional. Head: normocephalic and atraumatic. Oropharynx benign  Neck: Supple, no carotid bruits  Cardiac: Regular rate  rhythm, no murmur  Musculoskeletal: No deformity   Neurological examination  Mentation: Alert oriented to time, place, history taking. Follows all commands speech and language fluent Cranial nerve II-XII: Fundoscopic exam not done. Pupils were equal round reactive to light extraocular movements were full, visual field were full on confrontational test. Facial sensation and strength were normal. hearing was intact to finger rubbing bilaterally. Uvula tongue midline. head turning and shoulder shrug and were normal and  symmetric.Tongue protrusion into cheek strength was normal. Motor: Mild weak grip at left hand, mild fixation of left upper extremity on rapid rotating movement Sensory: Decreased to light touch at the left upper extremity.  Coordination: Cerebellar testing reveals good finger-nose-finger and heel-to-shin bilaterally.  Gait and station: Gait is normal. Tandem gait is normal. Romberg is negative. Reflexes: Deep tendon reflexes are symmetric and normal bilaterally.   DIAGNOSTIC DATA (LABS, IMAGING, TESTING) - I reviewed patient records, labs, notes, testing and imaging myself where available.  Lab Results  Component Value Date   WBC 6.2 08/03/2013   HGB 12.8 08/03/2013   HCT 37.3 08/03/2013   MCV 92.1 08/03/2013   PLT 184 08/03/2013      Component Value Date/Time   NA 140 08/03/2013 1215   K 3.6* 08/03/2013 1215   CL 106 08/03/2013 1215   CO2 20 08/03/2013 1215   GLUCOSE 83 08/03/2013 1215   BUN 16 08/03/2013 1215   BUN 12.9 05/24/2013 1001   CREATININE 0.82 08/03/2013 1215   CALCIUM 9.3 08/03/2013 1215   PROT 7.6 11/19/2011 1210   ALBUMIN 4.1 11/19/2011 1210   AST 19 11/19/2011 1210   ALT 15 11/19/2011 1210   ALKPHOS 65 11/19/2011 1210   BILITOT 0.3 11/19/2011 1210   GFRNONAA >90 08/03/2013 1215   GFRAA >90 08/03/2013 1215   Lab Results  Component Value Date   VITAMINB12 342 09/09/2013   Lab Results  Component Value Date   TSH 0.722 09/09/2013    ASSESSMENT: Katie [name] is a 38 y.o. female with PMHx of Right parietal AVM, bleeding to right lateral ventricle, failed endovascular embolization, status post focal radiation therapy in January 2015, continue complains of constant frequent headaches, also has a history of complex partial seizure, having more frequent breakthrough events, currently on Lamictal, Topirimate and Keppra. Recurrent seizure November eighth 2015,    1. Keep Topamax 100 mg twice a day, 2. She complains of worsening mood disorder, she is on Keppra 1000 mg one and  half tablets twice a day, continued to have seizures, Keppra can potentially worsening her mood disorder, depression anxiety, 3, tapering off Keppra, restart lamotrigine, 100 mg, titrating to 1 and half tablets twice a day 4, return to clinic in February 2016  5. Most recent recurrent seizure was January 29 2014, according to [facility], she should not drive, until seizure free for 6 months    Marcial Pacas, M.D. Ph.D.  [facility] [address] [place], [place] [address] [phone]

## 2014-02-13 ENCOUNTER — Telehealth: Payer: Self-pay | Admitting: Neurology

## 2014-02-13 NOTE — Telephone Encounter (Signed)
Called patient and left her a message to call the office back.

## 2014-02-13 NOTE — Telephone Encounter (Signed)
Pt is calling stating she cannot take Venlafaxine. She states it brings her down and does not agree with her body.  She has taken Paxil in the past.  Can you try giving her something else.  Also she has called a lawyer regarding her disability and they state that it would be good if the doctors would back her up.  Please call and advise.

## 2014-02-14 MED ORDER — PAROXETINE HCL 20 MG PO TABS: 20.0000 mg | ORAL_TABLET | Freq: Every day | ORAL | Status: DC

## 2014-02-14 NOTE — Telephone Encounter (Signed)
Called patient and left her message asking her to call me back. I will forward message to Dr.Yan.

## 2014-02-14 NOTE — Telephone Encounter (Signed)
Patient returning call to [facility], please call her back at home, she can be reached at [phone].

## 2014-02-14 NOTE — Telephone Encounter (Signed)
I have talked with Lars Mage, she has worsening depression symptoms with Effexor, Paxil worked well for her, I have called in 20 mg every day  She is in the process to apply for disability

## 2014-03-08 ENCOUNTER — Encounter (HOSPITAL_COMMUNITY): Payer: Self-pay | Admitting: Family Medicine

## 2014-03-08 ENCOUNTER — Emergency Department (HOSPITAL_COMMUNITY)
Admission: EM | Admit: 2014-03-08 | Discharge: 2014-03-08 | Disposition: A | Discharge status: Home / Self Care | Payer: 59 | Attending: Emergency Medicine | Admitting: Emergency Medicine

## 2014-03-08 ENCOUNTER — Emergency Department (HOSPITAL_COMMUNITY): Payer: 59

## 2014-03-08 DIAGNOSIS — R51 Headache: Secondary | ICD-10-CM | POA: Insufficient documentation

## 2014-03-08 DIAGNOSIS — G40909 Epilepsy, unspecified, not intractable, without status epilepticus: Secondary | ICD-10-CM | POA: Insufficient documentation

## 2014-03-08 DIAGNOSIS — Z9889 Other specified postprocedural states: Secondary | ICD-10-CM | POA: Insufficient documentation

## 2014-03-08 DIAGNOSIS — G441 Vascular headache, not elsewhere classified: Secondary | ICD-10-CM

## 2014-03-08 DIAGNOSIS — R531 Weakness: Secondary | ICD-10-CM

## 2014-03-08 DIAGNOSIS — Z86018 Personal history of other benign neoplasm: Secondary | ICD-10-CM | POA: Insufficient documentation

## 2014-03-08 DIAGNOSIS — E669 Obesity, unspecified: Secondary | ICD-10-CM | POA: Insufficient documentation

## 2014-03-08 DIAGNOSIS — M6289 Other specified disorders of muscle: Secondary | ICD-10-CM

## 2014-03-08 DIAGNOSIS — R519 Headache, unspecified: Secondary | ICD-10-CM | POA: Insufficient documentation

## 2014-03-08 DIAGNOSIS — Q282 Arteriovenous malformation of cerebral vessels: Secondary | ICD-10-CM

## 2014-03-08 DIAGNOSIS — F419 Anxiety disorder, unspecified: Secondary | ICD-10-CM | POA: Insufficient documentation

## 2014-03-08 DIAGNOSIS — I10 Essential (primary) hypertension: Secondary | ICD-10-CM | POA: Insufficient documentation

## 2014-03-08 DIAGNOSIS — F329 Major depressive disorder, single episode, unspecified: Secondary | ICD-10-CM | POA: Insufficient documentation

## 2014-03-08 DIAGNOSIS — Z3202 Encounter for pregnancy test, result negative: Secondary | ICD-10-CM | POA: Insufficient documentation

## 2014-03-08 DIAGNOSIS — Z79899 Other long term (current) drug therapy: Secondary | ICD-10-CM | POA: Insufficient documentation

## 2014-03-08 DIAGNOSIS — Z87891 Personal history of nicotine dependence: Secondary | ICD-10-CM | POA: Insufficient documentation

## 2014-03-08 HISTORY — DX: Weakness: R53.1

## 2014-03-08 LAB — URINALYSIS, ROUTINE W REFLEX MICROSCOPIC
Bilirubin Urine: NEGATIVE
Glucose, UA: NEGATIVE mg/dL
Hgb urine dipstick: NEGATIVE
Ketones, ur: NEGATIVE mg/dL
LEUKOCYTES UA: NEGATIVE
NITRITE: NEGATIVE
PH: 7.5 (ref 5.0–8.0)
Protein, ur: NEGATIVE mg/dL
SPECIFIC GRAVITY, URINE: 1.039 — AB (ref 1.005–1.030)
Urobilinogen, UA: 1 mg/dL (ref 0.0–1.0)

## 2014-03-08 LAB — CBC
HCT: 39.7 % (ref 36.0–46.0)
Hemoglobin: 13.5 g/dL (ref 12.0–15.0)
MCH: 31.6 pg (ref 26.0–34.0)
MCHC: 34 g/dL (ref 30.0–36.0)
MCV: 93 fL (ref 78.0–100.0)
Platelets: 183 10*3/uL (ref 150–400)
RBC: 4.27 MIL/uL (ref 3.87–5.11)
RDW: 13.1 % (ref 11.5–15.5)
WBC: 5.6 10*3/uL (ref 4.0–10.5)

## 2014-03-08 LAB — COMPREHENSIVE METABOLIC PANEL
ALK PHOS: 45 U/L (ref 39–117)
ALT: 18 U/L (ref 0–35)
AST: 16 U/L (ref 0–37)
Albumin: 4.1 g/dL (ref 3.5–5.2)
Anion gap: 12 (ref 5–15)
BILIRUBIN TOTAL: 0.4 mg/dL (ref 0.3–1.2)
BUN: 16 mg/dL (ref 6–23)
CHLORIDE: 105 meq/L (ref 96–112)
CO2: 22 meq/L (ref 19–32)
Calcium: 9.1 mg/dL (ref 8.4–10.5)
Creatinine, Ser: 0.74 mg/dL (ref 0.50–1.10)
GLUCOSE: 86 mg/dL (ref 70–99)
Potassium: 3.5 mEq/L — ABNORMAL LOW (ref 3.7–5.3)
SODIUM: 139 meq/L (ref 137–147)
Total Protein: 7 g/dL (ref 6.0–8.3)

## 2014-03-08 LAB — I-STAT CHEM 8, ED
BUN: 16 mg/dL (ref 6–23)
CALCIUM ION: 1.14 mmol/L (ref 1.12–1.23)
CHLORIDE: 110 meq/L (ref 96–112)
Creatinine, Ser: 0.8 mg/dL (ref 0.50–1.10)
GLUCOSE: 86 mg/dL (ref 70–99)
HCT: 42 % (ref 36.0–46.0)
Hemoglobin: 14.3 g/dL (ref 12.0–15.0)
Potassium: 3.4 mEq/L — ABNORMAL LOW (ref 3.7–5.3)
Sodium: 141 mEq/L (ref 137–147)
TCO2: 19 mmol/L (ref 0–100)

## 2014-03-08 LAB — DIFFERENTIAL
Basophils Absolute: 0 10*3/uL (ref 0.0–0.1)
Basophils Relative: 0 % (ref 0–1)
EOS ABS: 0.1 10*3/uL (ref 0.0–0.7)
EOS PCT: 1 % (ref 0–5)
LYMPHS ABS: 1.7 10*3/uL (ref 0.7–4.0)
Lymphocytes Relative: 30 % (ref 12–46)
Monocytes Absolute: 0.4 10*3/uL (ref 0.1–1.0)
Monocytes Relative: 8 % (ref 3–12)
NEUTROS PCT: 61 % (ref 43–77)
Neutro Abs: 3.4 10*3/uL (ref 1.7–7.7)

## 2014-03-08 LAB — RAPID URINE DRUG SCREEN, HOSP PERFORMED
Amphetamines: NOT DETECTED
BARBITURATES: POSITIVE — AB
Benzodiazepines: NOT DETECTED
Cocaine: NOT DETECTED
Opiates: POSITIVE — AB
TETRAHYDROCANNABINOL: NOT DETECTED

## 2014-03-08 LAB — ETHANOL: Alcohol, Ethyl (B): <11 mg/dL (ref 0–11)

## 2014-03-08 LAB — PROTIME-INR
INR: 1.12 (ref 0.00–1.49)
Prothrombin Time: 14.5 seconds (ref 11.6–15.2)

## 2014-03-08 LAB — PREGNANCY, URINE: Preg Test, Ur: NEGATIVE

## 2014-03-08 MED ORDER — DIPHENHYDRAMINE HCL 50 MG/ML IJ SOLN
25.0000 mg | Freq: Once | INTRAMUSCULAR | Status: AC
Start: 2014-03-08 — End: 2014-03-08
  Administered 2014-03-08: 25 mg via INTRAVENOUS
  Filled 2014-03-08: qty 1

## 2014-03-08 MED ORDER — METOCLOPRAMIDE HCL 5 MG/ML IJ SOLN
10.0000 mg | Freq: Once | INTRAMUSCULAR | Status: AC
  Administered 2014-03-08: 10 mg via INTRAVENOUS
  Filled 2014-03-08: qty 2

## 2014-03-08 MED ORDER — IOHEXOL 350 MG/ML SOLN
50.0000 mL | Freq: Once | INTRAVENOUS | Status: AC | PRN
  Administered 2014-03-08: 50 mL via INTRAVENOUS

## 2014-03-08 NOTE — Discharge Instructions (Signed)
Follow-up for recheck with your neurosurgeon as previously arranged.  If you were given medicines take as directed.  If you are on coumadin or contraceptives realize their levels and effectiveness is altered by many different medicines.  If you have any reaction (rash, tongues swelling, other) to the medicines stop taking and see a physician.   Please follow up as directed and return to the ER or see a physician for new or worsening symptoms.  Thank you. Filed Vitals:   03/08/14 1151 03/08/14 1257 03/08/14 1306  BP: 115/77  112/62  Pulse: 79  65  Temp: 98.2 F (36.8 C) 98.2 F (36.8 C)   TempSrc: Oral    Resp: 15  27  Height: 5\' 2"  (1.575 m)    Weight: 141 lb (63.957 kg)    SpO2: 97%  100%

## 2014-03-08 NOTE — ED Provider Notes (Signed)
CSN: [id number]     Arrival date & time 03/08/14  1144 History   First MD Initiated Contact with Patient 03/08/14 1222     Chief Complaint  Patient presents with  . Headache     (Consider location/radiation/quality/duration/timing/severity/associated sxs/prior Treatment) HPI Comments: 38 year old female with history of obesity, headache, AV malformation, cerebral aneurysm presents with left-sided facial numbness left arm weakness and left leg heaviness since 8:30 this morning. Patient denies history of similar. No stroke history or head injury. Mild head fullness and discomfort, no neck pain. Symptoms constant. No blood thinners  Patient is a 38 y.o. female presenting with headaches. The history is provided by the patient.  Headache Associated symptoms: numbness and seizures (brief arm shaking episode that resolved, patient has seizure history on Depakote for)   Associated symptoms: no abdominal pain, no back pain, no congestion, no fever, no neck pain, no neck stiffness and no vomiting     Past Medical History  Diagnosis Date  . Fibromyalgia   . Migraine   . Hypertension   . Depression   . Anxiety   . AVM (arteriovenous malformation)   . Cerebral aneurysm   . Seizures   . Liver hemangioma    Past Surgical History  Procedure Laterality Date  . Tubal ligation    . Cerebral embolization  dec 2013  . Wisdom tooth extraction  2001  . Radiation brain    . Dilitation & currettage/hystroscopy with novasure ablation N/A 08/04/2013    Procedure: DILATATION & CURETTAGE/HYSTEROSCOPY WITH NOVASURE ABLATION;  Surgeon: Annalee Genta, DO;  Location: [facility];  Service: Gynecology;  Laterality: N/A;  . Cerebral angiogram     Family History  Problem Relation Age of Onset  . Bipolar disorder Mother   . Thyroid disease Maternal Grandmother   . Glaucoma Maternal Grandmother   . Aneurysm Father    History  Substance Use Topics  . Smoking status: Former Research scientist (life sciences)  . Smokeless tobacco: Never  Used     Comment: Quit 2007  . Alcohol Use: 0.6 oz/week    1 Cans of beer per week     Comment: occasional   OB History    Gravida Para Term Preterm AB TAB SAB Ectopic Multiple Living   _0 Review of Systems  Constitutional: Negative for fever and chills.  HENT: Negative for congestion.   Eyes: Negative for visual disturbance.  Respiratory: Negative for shortness of breath.   Cardiovascular: Negative for chest pain.  Gastrointestinal: Negative for vomiting and abdominal pain.  Genitourinary: Negative for dysuria and flank pain.  Musculoskeletal: Negative for back pain, neck pain and neck stiffness.  Skin: Negative for rash.  Neurological: Positive for seizures (brief arm shaking episode that resolved, patient has seizure history on Depakote for), weakness, numbness and headaches. Negative for speech difficulty and light-headedness.      Allergies  Review of patient's allergies indicates no known allergies.  Home Medications   Prior to Admission medications   Medication Sig Start Date End Date Taking? Authorizing Provider  butalbital-acetaminophen-caffeine (FIORICET) 50-325-40 MG per tablet Take 1-2 tablets by mouth every 6 (six) hours as needed for headache. 02/09/14 02/09/15 Yes Marcial Pacas, MD  clonazePAM (KLONOPIN) 0.5 MG tablet Take 1 tablet (0.5 mg total) by mouth 2 (two) times daily as needed for anxiety. For anxiety 09/01/12  Yes Shanker Kristeen Mans, MD  HYDROcodone-acetaminophen ([place]) 7.5-325 MG per tablet Take 1 tablet by mouth  every 6 (six) hours as needed for moderate pain.  02/21/14  Yes Historical Provider, MD  lamoTRIgine (LAMICTAL) 100 MG tablet One and 1/2 po twice a day 02/09/14  Yes Marcial Pacas, MD  methocarbamol (ROBAXIN) 500 MG tablet Take 500 mg by mouth 2 (two) times daily.   Yes Historical Provider, MD  PARoxetine (PAXIL) 20 MG tablet Take 1 tablet (20 mg total) by mouth daily. 02/14/14  Yes Marcial Pacas, MD  topiramate (TOPAMAX) 100 MG tablet Take  1 tablet (100 mg total) by mouth 2 (two) times daily. 01/05/14  Yes Marcial Pacas, MD  HYDROcodone-acetaminophen (NORCO/VICODIN) 5-325 MG per tablet Take 1 tablet by mouth every 8 (eight) hours as needed for moderate pain. Patient not taking: Reported on 03/08/2014 12/06/13   Marcial Pacas, MD  LamoTRIgine (LAMICTAL ODT) 25 & 50 & 100 MG KIT Take as instructed Patient not taking: Reported on 03/08/2014 02/09/14   Marcial Pacas, MD  levETIRAcetam (KEPPRA) 1000 MG tablet One and 1/2 tab po bid Patient not taking: Reported on 03/08/2014 01/11/14   Marcial Pacas, MD  ondansetron (ZOFRAN) 4 MG tablet Take 1 tablet (4 mg total) by mouth every 8 (eight) hours as needed for nausea or vomiting. 12/06/13   Marcial Pacas, MD   BP 112/62 mmHg  Pulse 65  Temp(Src) 98.2 F (36.8 C) (Oral)  Resp 27  Ht _0  (1.575 m)  Wt 141 lb (63.957 kg)  BMI 25.78 kg/m2  SpO2 100% Physical Exam  Constitutional: She is oriented to person, place, and time. She appears well-developed and well-nourished.  HENT:  Head: Normocephalic and atraumatic.  Eyes: Conjunctivae are normal. Right eye exhibits no discharge. Left eye exhibits no discharge.  Neck: Normal range of motion. Neck supple. No tracheal deviation present.  Cardiovascular: Normal rate and regular rhythm.   Pulmonary/Chest: Effort normal and breath sounds normal.  Abdominal: Soft. She exhibits no distension. There is no tenderness. There is no guarding.  Musculoskeletal: She exhibits no edema.  Neurological: She is alert and oriented to person, place, and time. GCS eye subscore is 4. GCS verbal subscore is 5. GCS motor subscore is 6.  Patient has decreased sensation left arm and leg and left face versus right, subjective. Patient has no arm drift, no leg drift, finger nose intact, pupils equal, neck supple no meningismus. No facial droop, patient feels decreased sensation left face versus right, patient feels subjectively weaker in the left arm and leg versus right.  Skin: Skin  is warm. No rash noted.  Psychiatric: She has a normal mood and affect.  Nursing note and vitals reviewed.   ED Course  Procedures (including critical care time) Labs Review Labs Reviewed  COMPREHENSIVE METABOLIC PANEL - Abnormal; Notable for the following:    Potassium 3.5 (*)    All other components within normal limits  URINE RAPID DRUG SCREEN (HOSP PERFORMED) - Abnormal; Notable for the following:    Opiates POSITIVE (*)    Barbiturates POSITIVE (*)    All other components within normal limits  URINALYSIS, ROUTINE W REFLEX MICROSCOPIC - Abnormal; Notable for the following:    APPearance HAZY (*)    Specific Gravity, Urine 1.039 (*)    All other components within normal limits  I-STAT CHEM 8, ED - Abnormal; Notable for the following:    Potassium 3.4 (*)    All other components within normal limits  ETHANOL  PROTIME-INR  CBC  DIFFERENTIAL  PREGNANCY, URINE    Imaging Review Ct Angio Head W/cm &/  or Wo Cm  03/08/2014   CLINICAL DATA:  Patient with right parietal arteriovenous malformation, previously embolized and radiated. Presents with left-sided weakness and abnormal sensations.  EXAM: CT ANGIOGRAPHY HEAD  TECHNIQUE: Multidetector CT imaging of the head was performed using the standard protocol during bolus administration of intravenous contrast. Multiplanar CT image reconstructions and MIPs were obtained to evaluate the vascular anatomy.  CONTRAST:  77m OMNIPAQUE IOHEXOL 350 MG/ML SOLN  COMPARISON:  Multiple previous studies, most recently MR 12/04/2013, CT 11/14/2013, angiography 04/08/2013.  FINDINGS: Precontrast CT shows hyperdensity related to embolic material within the arteriovenous malformation in the right parietal region. There is some atrophy, encephalomalacia and gliosis in that region. No subarachnoid or intraparenchymal bleeding. No evidence of mass effect. No evidence of acute infarction. No hydrocephalus or extra-axial collection. No calvarial abnormality.  Sinuses, middle ears and mastoids are clear.  Both internal carotid arteries are widely patent into the brain. The siphon regions appear normal. The anterior and middle cerebral vessels are widely patent bilaterally. There is some residual arteriovenous malformation nidus with both deep and superficial venous drainage. No abnormality on the left. No posterior circulation pathology. Both vertebral arteries are widely patent to the basilar.  Review of the MIP images confirms the above findings.  IMPRESSION: No acute finding. No evidence of intraparenchymal or subarachnoid hemorrhage.  Previously treated right parietal arteriovenous malformation with previous embolization and radiation. Some regional volume loss/ gliosis. Some persistent nidus with both deep and superficial venous drainage.  No evidence of stenotic or occlusive disease.  Critical Value/emergent results were called by telephone at the time of interpretation on 03/08/2014 at 12:50 Pm to Dr. JElnora Morrison, who verbally acknowledged these results.   Electronically Signed   By: MNelson ChimesM.D.   On: 03/08/2014 13:13     EKG Interpretation None      MDM   Final diagnoses:  Headache  AVM (arteriovenous malformation) brain   Patient with history of AVM and aneurysm presents with head pressure/fullness and left sided numbness and weakness since 8:30. Given time interval code stroke called, differential including aneurysm rupture versus stroke versus atypical migraine versus other. Plan for migraine cocktail, CT head/Angiocath blood work. Neuro rec outpt fup.  Neurology evaluated and agrees with nonspecific mild symptoms subjective. CT head results reviewed and discussed with radiology, no acute findings. Discussed the case with neurosurgery will follow-up outpatient. Blood work reviewed unremarkable.  Results and differential diagnosis were discussed with the patient/parent/guardian. Close follow up outpatient was discussed, comfortable  with the plan.   Medications  metoCLOPramide (REGLAN) injection 10 mg (10 mg Intravenous Given 03/08/14 1346)  diphenhydrAMINE (BENADRYL) injection 25 mg (25 mg Intravenous Given 03/08/14 1347)  iohexol (OMNIPAQUE) 350 MG/ML injection 50 mL (50 mLs Intravenous Contrast Given 03/08/14 1238)    Filed Vitals:   03/08/14 1151 03/08/14 1257 03/08/14 1306  BP: 115/77  112/62  Pulse: 79  65  Temp: 98.2 F (36.8 C) 98.2 F (36.8 C)   TempSrc: Oral    Resp: 15  27  Height: _0  (1.575 m)    Weight: 141 lb (63.957 kg)    SpO2: 97%  100%    Final diagnoses:  Headache  AVM (arteriovenous malformation) brain        JMariea Clonts MD 03/08/14 1534

## 2014-03-08 NOTE — ED Notes (Signed)
Pt complaining of sudden onset of head pain, pressure and fullness in head. sts also left side of face and arm feels numb. Pt left eye slightly closed which is not normal. Pt slightly weaker on the left arm.

## 2014-03-08 NOTE — Consult Note (Signed)
Referring Physician: Reather Converse    Chief Complaint: left sided weakness HPI:                                                                                                                                         Katie [name] is an 38 y.o. female who woke up this AM feeling normal.  She went to work and then felt a pain in the right aspect of her head.  This was followed bu a funny heavy sensation in her left arm and leg.  She also endorses a HA which she states sis a 7/10 and throbbing. She states her coworkers felt her left eye seemed "puffy". On arrival to ED she was brought to CT and had a CT angio of head. Currently she still feels her left arm and leg feel heavy and left face, arm and leg has decreased sensation.   Date last known well: Date: 03/08/2014 Time last known well: Time: 08:30 tPA Given: No: recent AVM bleed  Past Medical History  Diagnosis Date  . Fibromyalgia   . Migraine   . Hypertension   . Depression   . Anxiety   . AVM (arteriovenous malformation)   . Cerebral aneurysm   . Seizures   . Liver hemangioma     Past Surgical History  Procedure Laterality Date  . Tubal ligation    . Cerebral embolization  dec 2013  . Wisdom tooth extraction  2001  . Radiation brain    . Dilitation & currettage/hystroscopy with novasure ablation N/A 08/04/2013    Procedure: DILATATION & CURETTAGE/HYSTEROSCOPY WITH NOVASURE ABLATION;  Surgeon: Annalee Genta, DO;  Location: [facility];  Service: Gynecology;  Laterality: N/A;  . Cerebral angiogram      Family History  Problem Relation Age of Onset  . Bipolar disorder Mother   . Thyroid disease Maternal Grandmother   . Glaucoma Maternal Grandmother   . Aneurysm Father    Social History:  reports that she has quit smoking. She has never used smokeless tobacco. She reports that she drinks about 0.6 oz of alcohol per week. She reports that she does not use illicit drugs.  Allergies: No Known Allergies  Medications:  Current Facility-Administered Medications  Medication Dose Route Frequency Provider Last Rate Last Dose  . diphenhydrAMINE (BENADRYL) injection 25 mg  25 mg Intravenous Once Enid Skeens, MD      . metoCLOPramide (REGLAN) injection 10 mg  10 mg Intravenous Once Enid Skeens, MD       Current Outpatient Prescriptions  Medication Sig Dispense Refill  . butalbital-acetaminophen-caffeine (FIORICET) 50-325-40 MG per tablet Take 1-2 tablets by mouth every 6 (six) hours as needed for headache. 20 tablet 0  . clonazePAM (KLONOPIN) 0.5 MG tablet Take 1 tablet (0.5 mg total) by mouth 2 (two) times daily as needed for anxiety. For anxiety 30 tablet 0  . HYDROcodone-acetaminophen ([place]CO) 7.5-325 MG per tablet Take 1 tablet by mouth every 6 (six) hours as needed for moderate pain.   0  . lamoTRIgine (LAMICTAL) 100 MG tablet One and 1/2 po twice a day 90 tablet 11  . methocarbamol (ROBAXIN) 500 MG tablet Take 500 mg by mouth 2 (two) times daily.    Marland Kitchen PARoxetine (PAXIL) 20 MG tablet Take 1 tablet (20 mg total) by mouth daily. 30 tablet 11  . topiramate (TOPAMAX) 100 MG tablet Take 1 tablet (100 mg total) by mouth 2 (two) times daily. 60 tablet 11  . HYDROcodone-acetaminophen (NORCO/VICODIN) 5-325 MG per tablet Take 1 tablet by mouth every 8 (eight) hours as needed for moderate pain. (Patient not taking: Reported on 03/08/2014) 60 tablet 0  . LamoTRIgine (LAMICTAL ODT) 25 & 50 & 100 MG KIT Take as instructed (Patient not taking: Reported on 03/08/2014) 1 kit 0  . levETIRAcetam (KEPPRA) 1000 MG tablet One and 1/2 tab po bid (Patient not taking: Reported on 03/08/2014) 90 tablet 11  . ondansetron (ZOFRAN) 4 MG tablet Take 1 tablet (4 mg total) by mouth every 8 (eight) hours as needed for nausea or vomiting. 20 tablet 0  . [DISCONTINUED] esomeprazole (NEXIUM) 40 MG capsule Take 40 mg by mouth daily before  breakfast. Dispense as written       ROS:                                                                                                                                       History obtained from the patient  General ROS: negative for - chills, fatigue, fever, night sweats, weight gain or weight loss Psychological ROS: negative for - behavioral disorder, hallucinations, memory difficulties, mood swings or suicidal ideation Ophthalmic ROS: negative for - blurry vision, double vision, eye pain or loss of vision ENT ROS: negative for - epistaxis, nasal discharge, oral lesions, sore throat, tinnitus or vertigo Allergy and Immunology ROS: negative for - hives or itchy/watery eyes Hematological and Lymphatic ROS: negative for - bleeding problems, bruising or swollen lymph nodes Endocrine ROS: negative for - galactorrhea, hair pattern changes, polydipsia/polyuria or temperature intolerance Respiratory ROS: negative for - cough, hemoptysis, shortness of breath or wheezing Cardiovascular ROS:  negative for - chest pain, dyspnea on exertion, edema or irregular heartbeat Gastrointestinal ROS: negative for - abdominal pain, diarrhea, hematemesis, nausea/vomiting or stool incontinence Genito-Urinary ROS: negative for - dysuria, hematuria, incontinence or urinary frequency/urgency Musculoskeletal ROS: negative for - joint swelling or muscular weakness Neurological ROS: as noted in HPI Dermatological ROS: negative for rash and skin lesion changes  Neurologic Examination:                                                                                                      Blood pressure 112/62, pulse 65, temperature 98.2 F (36.8 C), temperature source Oral, resp. rate 27, height _0  (1.575 m), weight 63.957 kg (141 lb), SpO2 100 %.  HEENT-  Normocephalic, no lesions, without obvious abnormality.  Normal external eye and conjunctiva.  Normal TM's bilaterally.  Normal auditory canals and external ears.  Normal external nose, mucus membranes and septum.  Normal pharynx. Cardiovascular- regular rate and rhythm, S1, S2 normal, no murmur, click, rub or gallop, pulses palpable throughout   Lungs- chest clear, no wheezing, rales, normal symmetric air entry Abdomen- soft, non-tender; bowel sounds normal; no masses,  no organomegaly Extremities- less then 2 second capillary refill Lymph-no adenopathy palpable Musculoskeletal-no joint tenderness, deformity or swelling Skin-warm and dry, no hyperpigmentation, vitiligo, or suspicious lesions  Neurological Examination Mental Status: Alert, oriented, thought content appropriate.  Speech fluent without evidence of aphasia.  Able to follow 3 step commands without difficulty. Cranial Nerves: II: Discs flat bilaterally; Visual fields grossly normal, pupils equal, round, reactive to light and accommodation III,IV, VI: ptosis not present, extra-ocular motions intact bilaterally V,VII: smile symmetric, facial light touch sensation decreased on the left and splits midline to tuning fork VIII: hearing normal bilaterally IX,X: gag reflex present XI: bilateral shoulder shrug XII: midline tongue extension Motor: Right : Upper extremity   5/5    Left:     Upper extremity   5/5  Lower extremity   5/5     Lower extremity   5/5 Tone and bulk:normal tone throughout; no atrophy noted.  Give-way weakness noted on the left.   Sensory: Pinprick and light touch stated to be decreased on the left arm, chest and leg.   Deep Tendon Reflexes: 2+ and symmetric throughout Plantars: Right: downgoing   Left: downgoing Cerebellar: normal finger-to-nose, normal heel-to-shin test Gait: not tested due to safety.        Lab Results: Basic Metabolic Panel:  Recent Labs Lab 03/08/14 1230 03/08/14 1242  NA 139 141  K 3.5* 3.4*  CL 105 110  CO2 22  --   GLUCOSE 86 86  BUN 16 16  CREATININE 0.74 0.80  CALCIUM 9.1  --     Liver Function Tests:  Recent Labs Lab  03/08/14 1230  AST 16  ALT 18  ALKPHOS 45  BILITOT 0.4  PROT 7.0  ALBUMIN 4.1   No results for input(s): LIPASE, AMYLASE in the last 168 hours. No results for input(s): AMMONIA in the last 168 hours.  CBC:  Recent Labs Lab 03/08/14 1230 03/08/14 1242  WBC 5.6  --   NEUTROABS 3.4  --   HGB 13.5 14.3  HCT 39.7 42.0  MCV 93.0  --   PLT 183  --     Cardiac Enzymes: No results for input(s): CKTOTAL, CKMB, CKMBINDEX, TROPONINI in the last 168 hours.  Lipid Panel: No results for input(s): CHOL, TRIG, HDL, CHOLHDL, VLDL, LDLCALC in the last 168 hours.  CBG: No results for input(s): GLUCAP in the last 168 hours.  Microbiology: Results for orders placed or performed during the hospital encounter of 12/08/12  Urine culture     Status: None   Collection Time: 12/08/12 12:11 PM  Result Value Ref Range Status   Specimen Description URINE, CLEAN CATCH  Final   Special Requests NONE  Final   Culture  Setup Time   Final    12/08/2012 14:26 Performed at [facility]   Final    >=100,000 COLONIES/ML Performed at [facility]   Culture   Final    Multiple bacterial morphotypes present, none predominant. Suggest appropriate recollection if clinically indicated. Performed at [facility]   Report Status 12/09/2012 FINAL  Final    Coagulation Studies:  Recent Labs  03/08/14 1230  LABPROT 14.5  INR 1.12    Imaging: Ct Angio Head W/cm &/or Wo Cm  03/08/2014   CLINICAL DATA:  Patient with right parietal arteriovenous malformation, previously embolized and radiated. Presents with left-sided weakness and abnormal sensations.  EXAM: CT ANGIOGRAPHY HEAD  TECHNIQUE: Multidetector CT imaging of the head was performed using the standard protocol during bolus administration of intravenous contrast. Multiplanar CT image reconstructions and MIPs were obtained to evaluate the vascular anatomy.  CONTRAST:  17mL OMNIPAQUE IOHEXOL 350 MG/ML SOLN   COMPARISON:  Multiple previous studies, most recently MR 12/04/2013, CT 11/14/2013, angiography 04/08/2013.  FINDINGS: Precontrast CT shows hyperdensity related to embolic material within the arteriovenous malformation in the right parietal region. There is some atrophy, encephalomalacia and gliosis in that region. No subarachnoid or intraparenchymal bleeding. No evidence of mass effect. No evidence of acute infarction. No hydrocephalus or extra-axial collection. No calvarial abnormality. Sinuses, middle ears and mastoids are clear.  Both internal carotid arteries are widely patent into the brain. The siphon regions appear normal. The anterior and middle cerebral vessels are widely patent bilaterally. There is some residual arteriovenous malformation nidus with both deep and superficial venous drainage. No abnormality on the left. No posterior circulation pathology. Both vertebral arteries are widely patent to the basilar.  Review of the MIP images confirms the above findings.  IMPRESSION: No acute finding. No evidence of intraparenchymal or subarachnoid hemorrhage.  Previously treated right parietal arteriovenous malformation with previous embolization and radiation. Some regional volume loss/ gliosis. Some persistent nidus with both deep and superficial venous drainage.  No evidence of stenotic or occlusive disease.  Critical Value/emergent results were called by telephone at the time of interpretation on 03/08/2014 at 12:50 Pm to Dr. Elnora Morrison , who verbally acknowledged these results.   Electronically Signed   By: Nelson Chimes M.D.   On: 03/08/2014 13:13    Etta Quill PA-C [facility] [phone]  03/08/2014, 1:23 PM   Patient seen and examined.  Clinical course and management discussed.  Necessary edits performed.  I agree with the above.  Assessment and plan of care developed and discussed below.     Assessment: 38 y.o. female presenting with headache and left sided hemiparesis.   Neurological examination has multiple functional  features.  Head CT personally reviewed and shows no acute hemorrhage.  AVM stable.  Patient followed by Dr. Christella Noa on an outpatient basis.    Stroke Risk Factors - hypertension  Recommendations: 1.  Symptomatic headache treatment 2.  Would discuss patient with Dr. Christella Noa.  No further neurologic intervention recommended at this time.    Case discussed with Dr. Theodosia Quay, MD [facility] [phone]  03/08/2014  1:32 PM

## 2014-03-08 NOTE — ED Notes (Signed)
IV removed from rt. Forearm, 2x2 gauze applied with tape.

## 2014-03-08 NOTE — Code Documentation (Signed)
38yo female arriving to [facility] at 60 via private vehicle.  Patient with h/o AVM rupture who experience a "pop" sensation in her head this morning at 0830.  She developed headache that she describes as pressure, 7/10 pain, and subsequent "heaviness" in her left arm and leg.  Patient went to a restaurant where her friends thought that her eye was drooping, and she decided to come to the hospital.  Code Stroke called in the ED.  Patient to CT.  Stroke team at the bedside.  NIHSS 1, see documentation for details and code stroke times.  Patient with decreased sensation on the left side.  Dr. Doy Mince at the bedside.  No acute stroke treatment at this time.  Will treat headache.  Bedside handoff with ED RN Doroteo Bradford.

## 2014-03-08 NOTE — ED Notes (Signed)
Patient transported to CT 

## 2014-03-14 ENCOUNTER — Other Ambulatory Visit: Payer: Self-pay | Admitting: Neurosurgery

## 2014-03-14 DIAGNOSIS — Q282 Arteriovenous malformation of cerebral vessels: Secondary | ICD-10-CM

## 2014-03-23 ENCOUNTER — Ambulatory Visit
Admission: RE | Admit: 2014-03-23 | Discharge: 2014-03-23 | Disposition: A | Discharge status: Home / Self Care | Payer: 59 | Source: Ambulatory Visit | Attending: Neurosurgery | Admitting: Neurosurgery

## 2014-03-23 DIAGNOSIS — Q282 Arteriovenous malformation of cerebral vessels: Secondary | ICD-10-CM

## 2014-03-23 MED ORDER — GADOBENATE DIMEGLUMINE 529 MG/ML IV SOLN
13.0000 mL | Freq: Once | INTRAVENOUS | Status: AC | PRN
  Administered 2014-03-23: 13 mL via INTRAVENOUS

## 2014-03-29 ENCOUNTER — Ambulatory Visit (INDEPENDENT_AMBULATORY_CARE_PROVIDER_SITE_OTHER): Payer: 59 | Admitting: Neurology

## 2014-03-29 ENCOUNTER — Encounter: Payer: Self-pay | Admitting: Neurology

## 2014-03-29 VITALS — Ht 62.0 in | Wt 140.0 lb

## 2014-03-29 DIAGNOSIS — R51 Headache: Secondary | ICD-10-CM

## 2014-03-29 DIAGNOSIS — Q282 Arteriovenous malformation of cerebral vessels: Secondary | ICD-10-CM

## 2014-03-29 DIAGNOSIS — Q283 Other malformations of cerebral vessels: Secondary | ICD-10-CM

## 2014-03-29 DIAGNOSIS — R519 Headache, unspecified: Secondary | ICD-10-CM

## 2014-03-29 MED ORDER — BUTALBITAL-APAP-CAFFEINE 50-325-40 MG PO TABS: 1.0000 | ORAL_TABLET | Freq: Four times a day (QID) | ORAL | Status: DC | PRN

## 2014-03-29 MED ORDER — ONDANSETRON HCL 4 MG PO TABS: 4.0000 mg | ORAL_TABLET | Freq: Three times a day (TID) | ORAL | Status: DC | PRN

## 2014-03-29 MED ORDER — LACOSAMIDE 100 MG PO TABS: 100.0000 mg | ORAL_TABLET | Freq: Two times a day (BID) | ORAL | Status: DC

## 2014-03-29 NOTE — Progress Notes (Signed)
PATIENT: Katie [name] DOB: [birth year]  REASON FOR VISIT: sooner follow up for headaches, h/o A/V malformation  HISTORY OF PRESENT ILLNESS: Katie [name] is a 39 years old right-handed Caucasian female, her primary care physician is Dr. Maurice Small from [place] practice   She had a gradual onset of headache since 2012, in November 18 2011, she suffered a seizure, was taken to the [facility], CAT scan has demonstrated, right parietal AVM malformation, bleeding into her right lateral ventricle she was under the care neurosurgeon Dr. Christella Noa.  Initially she was referred to [facility] endovascular embolization, super-selective WADA which she passed and subsequently had transarterial embolization in November 2013, Postsurgically, she continued to have intractable headaches,frequent presentation to the emergency room, take hydrocodone 5/325 mg up to 60 tablets each month.   Repeat angiogram at [facility] in January 2015 demonstrate patent right AVM, moderate fast flow subcortical deep nidus of an arteriovenous malformation involving the right parietal/occipital subcortical region, supplied by the distal posterior parietal branches of the superior division of the right middle cerebral artery, the distal pericallosal artery on the right and also from the occipital branches of the right posterior cerebral artery. Venous drainage is inferiorly and posteriorly via a prominent right internal cerebral vein associated with a moderate-sized venous aneurysm proximally, with subsequent drainage into the vein of Galen and the straight sinus. No arterial aneurysms are identified.   She underwent radiation therapy at [facility] by Dr. Tammi Klippel in January 2015: 5 Dynamic Conformal Arcs to a prescription dose of 18 Gy. ExacTrac registration was performed for each couch angle. The 85.5% isodose line was prescribed.   Most recent MRI of brain in March 2015 there is no significant interval change in the  appearance of a right parietal arteriovenous malformation. Draining veins to the vein of Galen as well as superficial draining veins are stable. There is no enhancement or evidence for hemorrhage. The dural sinuses are patent and stable. Encephalomalacia associated with the lesion is unchanged. Ventricles are of normal size. No significant extra-axial fluid collection is present.   She continues to complains of frequent headaches,  constant 3 out of 10 headaches, 3-4 times each month, it would be exacerbated to a much severe 8 out of 10 headaches, she has to take hydrocodone, never tried preventive medications in the past,   She works full-time as a Educational psychologist, has mild left arm, leg heaviness, numbness, no visual change. She also continued to have spells self transient staring, confusion, suggestive of complex partial seizure, while taking Keppra 750 mg twice a day.   She was put on gradual titrating dose of polypharmacy treatment for possible complex partial seizure, this including lamotrigine 100 mg one and half tablets twice a day, Keppra 500 mg twice a day, Topamax 100 mg twice a day, complains of dizziness.  MRI in December 04 2013, Right parietal AVM again noted with history of glue embolization and radiation. Compared with the MRI of 05/26/2013, the patient has developed white matter hyperintensity in the right parietal lobe which does not show restricted diffusion. This may represent chronic infarction versus white matter edema related to the AVM. Radiation change considered less likely.  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 No acute ischemic infarction   UPDATE Nov 19th 2015:  She continues to complains of frequent pressure headaches, sometimes with facial numbness, daily, moderate to severe, last half day. 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 CCyndy Freezein April 08 2013   UPDATE Jan 6th 2016: She presented to the emergency room in March 08 2014, she felt jolt in her right eye, left face went numb, left eye looked droopy,  Hands were shaking lasting for one minute, she felt confused afterwards, this could represent a complex partial seizure  Repeat MRI of the brain in March 22 2014 showed no change compared to previous scan in September 2015,  She has appt with Dr. CArneta Clichein Jan 8th 2016.  She is getting trigger point injection through Dr. BSherlene Shams   She still have frequent headaches, every morning, holocranial headaches, some time worse than the other, several headache can last 6 hours, constant nauseous,  She has seizure twice a week, confuse, staring spell.   REVIEW OF SYSTEMS: Full 14 system review of systems performed and notable only for as above  ALLERGIES: No Known Allergies  HOME MEDICATIONS: Outpatient Prescriptions Prior to Visit  Medication Sig Dispense Refill  . butalbital-acetaminophen-caffeine (FIORICET) 50-325-40 MG per tablet Take 1-2 tablets by mouth every 6 (six) hours as needed for headache. 20 tablet 0  . clonazePAM (KLONOPIN) 0.5 MG tablet Take 1 tablet (0.5 mg total) by mouth 2 (two) times daily as needed for anxiety. For anxiety 30 tablet 0  . HYDROcodone-acetaminophen (NORCO) 7.5-325 MG per tablet Take 1 tablet by mouth every 6 (six) hours as needed for moderate pain.   0  . HYDROcodone-acetaminophen (NORCO/VICODIN) 5-325 MG per tablet Take 1 tablet by mouth every 8 (eight) hours as needed for moderate pain. 60 tablet 0  . LamoTRIgine (LAMICTAL ODT) 25 & 50 & 100 MG KIT Take as instructed 1  kit 0  . lamoTRIgine (LAMICTAL) 100 MG tablet One and 1/2 po twice a day 90 tablet 11  . levETIRAcetam (KEPPRA) 1000 MG tablet One and 1/2 tab po bid 90 tablet 11  . methocarbamol (ROBAXIN) 500 MG tablet Take 500 mg by mouth 2 (two) times daily.    . ondansetron (ZOFRAN) 4 MG tablet Take 1 tablet (4 mg total) by mouth every 8 (eight) hours as needed for nausea or vomiting. 20 tablet 0  . PARoxetine (PAXIL) 20 MG tablet Take 1 tablet (20 mg total) by mouth daily. 30 tablet 11  . topiramate (TOPAMAX) 100 MG tablet Take 1 tablet (100 mg total) by mouth 2 (two) times daily. 60 tablet 11   No facility-administered medications prior to visit.    PHYSICAL EXAM Filed Vitals:   03/29/14 0840  Height: _0  (1.575 m)  Weight: 140 lb (63.504 kg)   Body mass index is 25.6 kg/(m^2).   Generalized: Well developed, is tearful, emotional. Head: normocephalic and atraumatic. Oropharynx benign  Neck: Supple, no carotid bruits  Cardiac: Regular rate rhythm, no murmur  Musculoskeletal: No deformity   Neurological examination  Mentation: Alert oriented to time, place, history taking. Follows all commands speech and language fluent Cranial nerve II-XII: Fundoscopic exam not done. Pupils were equal round reactive to light extraocular movements were full, visual field were full on confrontational test. Facial sensation and strength were normal.  hearing was intact to finger rubbing bilaterally. Uvula tongue midline. head turning and shoulder shrug and were normal and symmetric.Tongue protrusion into cheek strength was normal. Motor: Mild weak grip at left hand, mild fixation of left upper extremity on rapid rotating movement Sensory: Decreased to light touch at the left upper extremity.  Coordination: Cerebellar testing reveals good finger-nose-finger and heel-to-shin bilaterally.  Gait and station: Gait is normal. Tandem gait is normal. Romberg is negative. Reflexes: Deep tendon reflexes are symmetric and  normal bilaterally.   DIAGNOSTIC DATA (LABS, IMAGING, TESTING) - I reviewed patient records, labs, notes, testing and imaging myself where available.  Lab Results  Component Value Date   WBC 5.6 03/08/2014   HGB 14.3 03/08/2014   HCT 42.0 03/08/2014   MCV 93.0 03/08/2014   PLT 183 03/08/2014      Component Value Date/Time   NA 140 08/03/2013 1215   K 3.6* 08/03/2013 1215   CL 106 08/03/2013 1215   CO2 20 08/03/2013 1215   GLUCOSE 83 08/03/2013 1215   BUN 16 08/03/2013 1215   BUN 12.9 05/24/2013 1001   CREATININE 0.82 08/03/2013 1215   CALCIUM 9.3 08/03/2013 1215   PROT 7.6 11/19/2011 1210   ALBUMIN 4.1 11/19/2011 1210   AST 19 11/19/2011 1210   ALT 15 11/19/2011 1210   ALKPHOS 65 11/19/2011 1210   BILITOT 0.3 11/19/2011 1210   GFRNONAA >90 08/03/2013 1215   GFRAA >90 08/03/2013 1215   Lab Results  Component Value Date   VITAMINB12 342 09/09/2013   Lab Results  Component Value Date   TSH 0.722 09/09/2013    ASSESSMENT: Riddhi Grether is a 39 y.o. female with PMHx of Right parietal AVM, bleeding to right lateral ventricle, failed endovascular embolization, status post focal radiation therapy in January 2015, continue complains of constant frequent headaches, with migraine features, also has a history of complex partial seizure, having more frequent breakthrough seizures, currently on Lamictal, Topirimate,   1. Keep Topamax 100 mg twice a day, Lamictal twice a day 2. Add on Vimpat 100 mg twice a day, she continue have frequent recurrent complex partial seizure 3. Botox preauthorization for her frequent headaches  Marcial Pacas, M.D. Ph.D.  [facility] [address], [place], [place] [address] [phone]

## 2014-04-18 ENCOUNTER — Telehealth: Payer: Self-pay | Admitting: Neurology

## 2014-04-18 NOTE — Telephone Encounter (Signed)
Patient does not want to have botox injections she will be returning to Dr. Ace Gins. States she is afraid of the side effects.

## 2014-04-24 ENCOUNTER — Telehealth: Payer: Self-pay | Admitting: Neurology

## 2014-04-24 NOTE — Telephone Encounter (Signed)
Pt states that her insurance will only cover part of the Lacosamide (VIMPAT) 100 MG TABS and her part is $80 and she cannot afford it.  She wants to know if there is another seizure medication she can try.  She also wants you to know that she slipped on ice twice and hit her head both times but did not lose consciousness.  Please call and advise.

## 2014-04-24 NOTE — Telephone Encounter (Signed)
I called back.  Recommended the patient obtain discount card from [email or web address], or as well she can call [employer] at [phone].  She was agreeable to trying this, and will call us back if anything further is needed.

## 2014-04-25 ENCOUNTER — Other Ambulatory Visit: Payer: Self-pay | Admitting: Obstetrics & Gynecology

## 2014-04-25 DIAGNOSIS — N644 Mastodynia: Secondary | ICD-10-CM

## 2014-04-25 DIAGNOSIS — N6311 Unspecified lump in the right breast, upper outer quadrant: Secondary | ICD-10-CM

## 2014-05-02 ENCOUNTER — Other Ambulatory Visit: Payer: 59

## 2014-05-09 ENCOUNTER — Other Ambulatory Visit: Payer: 59

## 2014-05-09 ENCOUNTER — Inpatient Hospital Stay: Admission: RE | Admit: 2014-05-09 | Payer: 59 | Source: Ambulatory Visit

## 2014-05-25 ENCOUNTER — Telehealth: Payer: Self-pay | Admitting: Neurology

## 2014-05-25 NOTE — Telephone Encounter (Signed)
Patient checking status of paperwork dropped off at front desk on 2/26 for completion in order to get Rx Lacosamide (VIMPAT) 100 MG TABS at no cost to her.  Also requesting refill for Rx HYDROcodone-acetaminophen (NORCO) 7.5-325 MG per tablet.  Please call when ready for pick up.

## 2014-05-25 NOTE — Telephone Encounter (Signed)
Dr. Ace Gins refilled hydrocodone for her.   Katie [name] - she is calling to check on her patient assistance application - pt would like a call letting her know the status - thanks.

## 2014-05-26 NOTE — Telephone Encounter (Signed)
I called again.  Got no answer.  Unable to leave message.

## 2014-05-26 NOTE — Telephone Encounter (Signed)
All info has already been sent to [employer], and the process can take a few weeks to complete.  I called the patient back.  Phone rang several times, with no answer, and no option to leave message.

## 2014-05-29 NOTE — Telephone Encounter (Signed)
I was finally able to reach the patient today (Monday).  She is aware app is pending.

## 2014-05-30 ENCOUNTER — Telehealth: Payer: Self-pay | Admitting: Neurology

## 2014-05-30 NOTE — Telephone Encounter (Signed)
Spoke to patient - she has not started her Vimpat yet because she was waiting on patient assistance. The company has now provided her samples while her application is being processed.  She is going to start the Vimpat today.  Her last episode occurred on 05/26/14.

## 2014-05-30 NOTE — Telephone Encounter (Signed)
On March 4 she was out with her dad and she got really hot and her legs started shaking and she had to lean over the rail because she felt like she was going to go out and then her head started hurting and this lasted for about 2-3 minutes. She picked up the seizure meds and is about to start those.  The best phone number to contact her is [phone] that is the home number if no answer you can call her cell phone at [phone] that will only be only until Friday and then cut off.

## 2014-06-12 ENCOUNTER — Ambulatory Visit: Payer: 59 | Admitting: Radiation Oncology

## 2014-06-14 NOTE — Progress Notes (Signed)
  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  Reports frequent seizures involving facial numbness, ringing in the ears and body jerking. Reports having a seizure yesterday. Reports on average she would have three seizures per week until started on Vemipat. Reports persistent sick stomach. Requesting refill of zofran. Weight and vitals stable. Reports blurry vision, floaters and flashes of silver like light. Reports fatigue. Speech fluid and responses are appropriate. Steady gait noted despite patient reporting she feels unsteady. Reports generalized weakness and poor appetite  ALLERGIES:  has No Known Allergies.  Meds: Current Outpatient Prescriptions  Medication Sig Dispense Refill  . butalbital-acetaminophen-caffeine (FIORICET) 50-325-40 MG per tablet Take 1-2 tablets by mouth every 6 (six) hours as needed for headache. 20 tablet 5  . clonazePAM (KLONOPIN) 0.5 MG tablet Take 1 tablet (0.5 mg total) by mouth 2 (two) times daily as needed for anxiety. For anxiety 30 tablet 0  . HYDROcodone-acetaminophen (NORCO) 7.5-325 MG per tablet Take 1 tablet by mouth every 6 (six) hours as needed for moderate pain.   0  . Lacosamide (VIMPAT) 100 MG TABS Take 1 tablet (100 mg total) by mouth 2 (two) times daily. 60 tablet 11  . lamoTRIgine (LAMICTAL) 100 MG tablet One and 1/2 po twice a day 90 tablet 11  . methocarbamol (ROBAXIN) 500 MG tablet Take 500 mg by mouth 2 (two) times daily.    Marland Kitchen PARoxetine (PAXIL) 20 MG tablet Take 1 tablet (20 mg total) by mouth daily. 30 tablet 11  . topiramate (TOPAMAX) 100  MG tablet Take 1 tablet (100 mg total) by mouth 2 (two) times daily. 60 tablet 11  . metroNIDAZOLE (FLAGYL) 500 MG tablet Take 500 mg by mouth 2 (two) times daily.  0  . ondansetron (ZOFRAN) 4 MG tablet Take 1 tablet (4 mg total) by mouth every 8 (eight) hours as needed for nausea or vomiting. (Patient not taking: Reported on 06/15/2014) 30 tablet 5  . [DISCONTINUED] esomeprazole (NEXIUM) 40 MG capsule Take 40 mg by mouth daily before breakfast. Dispense as written     No current facility-administered medications for this encounter.    Physical Findings: The patient is in no acute distress. Patient is alert and oriented.  weight is 142 lb 8 oz (64.638 kg). Her oral temperature is 98 F (36.7 C). Her blood pressure is 109/79 and her pulse is 79. Her respiration is 16 and oxygen saturation is 99%. .  No significant changes.  Lab Findings: Lab Results  Component Value Date   WBC 5.6 03/08/2014   HGB 14.3 03/08/2014   HCT 42.0 03/08/2014   MCV 93.0 03/08/2014   PLT 183 03/08/2014    @LASTCHEM @  Radiographic Findings: MRI 03/23/14 AVM present surrounding gliosis   Impression:  No evidence of intracranial bleed.  Plan:  Follow-up in 6 months.  _____________________________________  Sheral Apley. Tammi Klippel, M.D.

## 2014-06-15 ENCOUNTER — Encounter: Payer: Self-pay | Admitting: Radiation Oncology

## 2014-06-15 ENCOUNTER — Ambulatory Visit
Admission: RE | Admit: 2014-06-15 | Discharge: 2014-06-15 | Disposition: A | Discharge status: Home / Self Care | Payer: 59 | Source: Ambulatory Visit | Attending: Radiation Oncology | Admitting: Radiation Oncology

## 2014-06-15 VITALS — BP 109/79 | HR 79 | Temp 98.0°F | Resp 16 | Wt 142.5 lb

## 2014-06-15 DIAGNOSIS — Q282 Arteriovenous malformation of cerebral vessels: Secondary | ICD-10-CM

## 2014-06-15 NOTE — Progress Notes (Signed)
Reports frequent seizures involving facial numbness, ringing in the ears and body jerking. Reports having a seizure yesterday. Reports on average she would have three seizures per week until started on Vemipat. Reports persistent sick stomach. Requesting refill of zofran. Weight and vitals stable. Reports blurry vision, floaters and flashes of silver like light. Reports fatigue. Speech fluid and responses are appropriate. Steady gait noted despite patient reporting she feels unsteady. Reports generalized weakness and poor appetite.

## 2014-06-19 ENCOUNTER — Telehealth: Payer: Self-pay | Admitting: Neurology

## 2014-06-19 NOTE — Telephone Encounter (Signed)
I called [employer] at [phone].  Spoke with Herb.  He said they already spoke with this patient on 03/24 and advised her that they cannot accept her signature as "Mrs. Babula" she must resign the form with her full name.  I called the patient back.  Says she did drop off new form at the office today.

## 2014-06-19 NOTE — Telephone Encounter (Signed)
Patient requesting samples of Vimpat 100 mg. Please call at [phone].

## 2014-06-20 ENCOUNTER — Telehealth: Payer: Self-pay | Admitting: Neurology

## 2014-06-20 NOTE — Telephone Encounter (Signed)
Spoke to [place] after lunch - verified her medications, frequencies and dosages are correct - states she has not missed any doses.  Reports having 7 petit mal seizures today.

## 2014-06-20 NOTE — Telephone Encounter (Signed)
Patient stated she had 7 seizures today and questioning what should she do?  I informed patient I would forward message to [place] MD due to Dr. [name] out of office, but she insisted she wanted a return call from [place], [place].Marland KitchenMarland KitchenPlease call and advise.

## 2014-06-20 NOTE — Telephone Encounter (Signed)
She has transportation in the morning and accepted an appt with Megan at [facility].  Symptoms have improved this afternoon - she has been resting - reports some blurred vision and nausea from a headache (says this is not uncommon for her).  Instructed her if symptoms worsen to seek treatment at the ED and not delay her care - she verbalized understanding.

## 2014-06-21 ENCOUNTER — Telehealth: Payer: Self-pay | Admitting: *Deleted

## 2014-06-21 ENCOUNTER — Ambulatory Visit (INDEPENDENT_AMBULATORY_CARE_PROVIDER_SITE_OTHER): Payer: 59 | Admitting: Adult Health

## 2014-06-21 ENCOUNTER — Encounter: Payer: Self-pay | Admitting: Adult Health

## 2014-06-21 VITALS — BP 121/86 | HR 68 | Ht 62.0 in | Wt 145.0 lb

## 2014-06-21 DIAGNOSIS — R51 Headache: Secondary | ICD-10-CM

## 2014-06-21 DIAGNOSIS — R569 Unspecified convulsions: Secondary | ICD-10-CM | POA: Diagnosis not present

## 2014-06-21 DIAGNOSIS — Q282 Arteriovenous malformation of cerebral vessels: Secondary | ICD-10-CM

## 2014-06-21 DIAGNOSIS — Q283 Other malformations of cerebral vessels: Secondary | ICD-10-CM | POA: Diagnosis not present

## 2014-06-21 DIAGNOSIS — R519 Headache, unspecified: Secondary | ICD-10-CM

## 2014-06-21 MED ORDER — LACOSAMIDE 150 MG PO TABS: 150.0000 mg | ORAL_TABLET | Freq: Two times a day (BID) | ORAL | Status: DC

## 2014-06-21 MED ORDER — ONDANSETRON HCL 4 MG PO TABS: 4.0000 mg | ORAL_TABLET | Freq: Three times a day (TID) | ORAL | Status: DC | PRN

## 2014-06-21 MED ORDER — BUTALBITAL-APAP-CAFFEINE 50-325-40 MG PO TABS: 1.0000 | ORAL_TABLET | Freq: Four times a day (QID) | ORAL | Status: DC | PRN

## 2014-06-21 NOTE — Telephone Encounter (Signed)
Thank you!  I have contacted [employer] and advised them of the dose change.

## 2014-06-21 NOTE — Progress Notes (Addendum)
PATIENT: Katie [name] DOB: July 06, 1975  REASON FOR VISIT: follow up- Seizures HISTORY FROM: patient  HISTORY OF PRESENT ILLNESS:  Katie [name] is a 39 year old female with a history of AVM malformation, headaches and seizures. She returns today complaining of having approximately 11 seizures since this past Monday. She states that her seizures will start off as a headache that is located in the middle of the forehead. She states heavy breathing will then occur and she may stare off. She states it feels like her eyes roll back in her head. However she is completely aware of was going on during her seizures. She states that she can hear was going on when she has them. Afterwards she is tired and has to lay down to rest. States up until Monday she had only had 1 seizure last week. She states that this week she had to double her 50 mg tablets of Vimpat to make 100 mg since she no longer had any samples of the 100 mg tablets. Patient is currently in the process of getting patient assistance with Vimpat. She denies being sick, lack of sleep or stress. Patient also reports some blurred vision that has been ongoing. She states that this was going on before she started the Vimpat. She also has intractable headaches. She is been getting trigger point injections with Dr. Ace Gins. She uses Fioricet and Zofran for her headaches. He turns today for an evaluation.   HISTORY 03/29/14 [facility]): Katie [name] is a 39 years old right-handed Caucasian female, her primary care physician is Dr. Maurice Small from [facility]   She had a gradual onset of headache since 2012, in November 18 2011, she suffered a seizure, was taken to the [facility], CAT scan has demonstrated, right parietal AVM malformation, bleeding into her right lateral ventricle she was under the care neurosurgeon Dr. Christella Noa.  Initially she was referred to [facility] endovascular embolization, super-selective WADA which she passed and  subsequently had transarterial embolization in November 2013, Postsurgically, she continued to have intractable headaches,frequent presentation to the emergency room, take hydrocodone 5/325 mg up to 60 tablets each month.   Repeat angiogram at [facility] in January 2015 demonstrate patent right AVM, moderate fast flow subcortical deep nidus of an arteriovenous malformation involving the right parietal/occipital subcortical region, supplied by the distal posterior parietal branches of the superior division of the right middle cerebral artery, the distal pericallosal artery on the right and also from the occipital branches of the right posterior cerebral artery. Venous drainage is inferiorly and posteriorly via a prominent right internal cerebral vein associated with a moderate-sized venous aneurysm proximally, with subsequent drainage into the vein of Galen and the straight sinus. No arterial aneurysms are identified.   She underwent radiation therapy at [facility] by Dr. Tammi Klippel in January 2015: 5 Dynamic Conformal Arcs to a prescription dose of 18 Gy. ExacTrac registration was performed for each couch angle. The 85.5% isodose line was prescribed.   Most recent MRI of brain in March 2015 there is no significant interval change in the appearance of a right parietal arteriovenous malformation. Draining veins to the vein of Galen as well as superficial draining veins are stable. There is no enhancement or evidence for hemorrhage. The dural sinuses are patent and stable. Encephalomalacia associated with the lesion is unchanged. Ventricles are of normal size. No significant extra-axial fluid collection is present.   She continues to complains of frequent headaches, constant 3 out of 10 headaches, 3-4 times each  month, it would be exacerbated to a much severe 8 out of 10 headaches, she has to take hydrocodone, never tried preventive medications in the past,   She works full-time as a Educational psychologist, has mild left arm, leg  heaviness, numbness, no visual change. She also continued to have spells self transient staring, confusion, suggestive of complex partial seizure, while taking Keppra 750 mg twice a day.   She was put on gradual titrating dose of polypharmacy treatment for possible complex partial seizure, this including lamotrigine 100 mg one and half tablets twice a day, Keppra 500 mg twice a day, Topamax 100 mg twice a day, complains of dizziness.  MRI in December 04 2013, Right parietal AVM again noted with history of glue embolization and radiation. Compared with the MRI of 05/26/2013, the patient has developed white matter hyperintensity in the right parietal lobe which does not show restricted diffusion. This may represent chronic infarction versus white matter edema related to the AVM. Radiation change considered less likely. 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 No acute ischemic infarction  UPDATE Nov 19th 2015:  She continues to complains of frequent pressure headaches, sometimes with facial numbness, daily, moderate to severe, last half day. 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 Cyndy Freeze in April 08 2013   UPDATE Jan 6th 2016: She presented to the emergency room in March 08 2014, she felt jolt in her right eye, left face went numb, left eye looked droopy, Hands were shaking lasting for one minute, she felt confused afterwards, this could represent a complex partial seizure  Repeat MRI of the brain in March 22 2014 showed no change  compared to previous scan in September 2015,  She has appt with Dr. Arneta Cliche in Jan 8th 2016.  She is getting trigger point injection through Dr. Sherlene Shams,   She still have frequent headaches, every morning, holocranial headaches, some time worse than the other, several headache can last 6 hours, constant nauseous,  She has seizure twice a week, confuse, staring spell.  REVIEW OF SYSTEMS: Out of a complete 14 system review of symptoms, the patient complains only of the following symptoms, and all other reviewed systems are negative.  Ear pain, ringing in ears, blurred vision, palpitations, constipation, diarrhea, nausea, daytime sleepiness, frequency of urination, joint pain, walking difficulty, neck pain, neck stiffness, itching, headache, seizure, weakness, tremors, confusion, depression, hallucinations ALLERGIES: No Known Allergies  HOME MEDICATIONS: Outpatient Prescriptions Prior to Visit  Medication Sig Dispense Refill  . butalbital-acetaminophen-caffeine (FIORICET) 50-325-40 MG per tablet Take 1-2 tablets by mouth every 6 (six) hours as needed for headache. 20 tablet 5  . clonazePAM (KLONOPIN) 0.5 MG tablet Take 1 tablet (0.5 mg total) by mouth 2 (two) times daily as needed for anxiety. For anxiety 30 tablet 0  . HYDROcodone-acetaminophen (NORCO) 7.5-325 MG per tablet Take 1 tablet by mouth every 6 (six) hours as needed for moderate pain.   0  . Lacosamide (VIMPAT) 100 MG TABS Take 1 tablet (100 mg total) by mouth 2 (two) times daily. [place]  tablet 11  . lamoTRIgine (LAMICTAL) 100 MG tablet One and 1/2 po twice a day 90 tablet 11  . methocarbamol (ROBAXIN) 500 MG tablet Take 500 mg by mouth 2 (two) times daily.    . metroNIDAZOLE (FLAGYL) 500 MG tablet Take 500 mg by mouth 2 (two) times daily.  0  . ondansetron (ZOFRAN) 4 MG tablet Take 1 tablet (4 mg total) by mouth every 8 (eight) hours as needed for nausea or vomiting. (Patient not taking: Reported on 06/15/2014) 30 tablet 5  . PARoxetine  (PAXIL) 20 MG tablet Take 1 tablet (20 mg total) by mouth daily. 30 tablet 11  . topiramate (TOPAMAX) 100 MG tablet Take 1 tablet (100 mg total) by mouth 2 (two) times daily. 60 tablet 11   No facility-administered medications prior to visit.    PAST MEDICAL HISTORY: Past Medical History  Diagnosis Date  . Fibromyalgia   . Migraine   . Hypertension   . Depression   . Anxiety   . AVM (arteriovenous malformation)   . Cerebral aneurysm   . Seizures   . Liver hemangioma     PAST SURGICAL HISTORY: Past Surgical History  Procedure Laterality Date  . Tubal ligation    . Cerebral embolization  dec 2013  . Wisdom tooth extraction  2001  . Radiation brain    . Dilitation & currettage/hystroscopy with novasure ablation N/A 08/04/2013    Procedure: DILATATION & CURETTAGE/HYSTEROSCOPY WITH NOVASURE ABLATION;  Surgeon: Annalee Genta, DO;  Location: [facility];  Service: Gynecology;  Laterality: N/A;  . Cerebral angiogram      FAMILY HISTORY: Family History  Problem Relation Age of Onset  . Bipolar disorder Mother   . Thyroid disease Maternal Grandmother   . Glaucoma Maternal Grandmother   . Aneurysm Father     SOCIAL HISTORY: History   Social History  . Marital Status: Married    Spouse Name: [name]  . Number of Children: 2  . Years of Education: college   Occupational History  .      Disabled   Social History Main Topics  . Smoking status: Former Research scientist (life sciences)  . Smokeless tobacco: Never Used     Comment: Quit 2007  . Alcohol Use: 0.6 oz/week    1 Cans of beer per week     Comment: occasional  . Drug Use: No  . Sexual Activity: Yes    Birth Control/ Protection: None   Other Topics Concern  . Not on file   Social History Narrative   Patient lives at home with her husband [name])    Disabled.   Education college   Both handed   Caffeine two per week            PHYSICAL EXAM  Filed Vitals:   06/21/14 0805  BP: 121/86  Pulse: 68  Height: 5\' 2"  (1.575 m)    Weight: 145 lb (65.772 kg)   Body mass index is 26.51 kg/(m^2).  Generalized: Well developed, in no acute distress   Neurological examination  Mentation: Alert oriented to time, place, history taking. Follows all commands speech and language fluent Cranial nerve II-XII: Pupils were equal round reactive to light. Extraocular movements were full, visual field were full on confrontational test. Facial sensation and strength were normal. Uvula tongue midline. Head turning and shoulder shrug  were normal and symmetric. Motor: The motor testing reveals 5 over 5 strength of all 4 extremities slightly weaker in the left upper extremity. Good symmetric motor tone  is noted throughout.  Sensory: Sensory testing is intact to soft touch on all 4 extremities. No evidence of extinction is noted.  Coordination: Cerebellar testing reveals good finger-nose-finger and heel-to-shin bilaterally.  Gait and station: Gait is normal. Tandem gait is normal. Romberg is negative. No drift is seen.  Reflexes: Deep tendon reflexes are symmetric and normal bilaterally.  Marland Kitchen   DIAGNOSTIC DATA (LABS, IMAGING, TESTING) - I reviewed patient records, labs, notes, testing and imaging myself where available.    ASSESSMENT AND PLAN 39 y.o. year old female  has a past medical history of Fibromyalgia; Migraine; Hypertension; Depression; Anxiety; AVM (arteriovenous malformation); Cerebral aneurysm; Seizures; and Liver hemangioma. here with   1. Seizures 2. Headaches 3. AVM  Patient has continued to have seizures. Since Monday she had 11 seizures. I will increase the patient's Vimpat to 150 mg BID. She was given samples from our office to last her for 3 weeks until she can get patient assistance. She will continue taking Topamax and Lamictal. If her seizure frequency does not improve she should let us know. Patient also uses few are set and Zofran for her headaches. I will refill those today. Patient was advised to set up an  appointment with Dr. Ace Gins for trigger point injections. Patient advised that if her symptoms worsen or she develops new symptoms she should let us know. Otherwise she will follow-up in one month with Dr. Earlie Raveling, MSN, NP-C 06/21/2014, 8:06 AM [facility] [address] [place],  [address] [phone]  Note: This document was prepared with digital dictation and possible smart phrase technology. Any transcriptional errors that result from this process are unintentional.  Personally examined patient and images, and have documentes history, physical, neuro exam,assessment and plan as stated above.   Sarina Ill, MD Stroke Neurology [phone] [facility]

## 2014-06-21 NOTE — Telephone Encounter (Signed)
Pt was having increased seizure activity and was worked-in by [employer].  She increased her Vimpat to 150mg , BID.  We have provided her with enough samples to last three weeks.  I know you are working with her on patient assistance and wanted you to be aware of her new dosage.

## 2014-06-21 NOTE — Patient Instructions (Signed)
Increase Vimpat to 150 mg twice a day.  Please call if seizure frequency does not improve.

## 2014-06-21 NOTE — Telephone Encounter (Signed)
Patient arrived, with her husband, for her appt today.

## 2014-06-26 ENCOUNTER — Ambulatory Visit: Payer: 59 | Admitting: Neurology

## 2014-06-28 ENCOUNTER — Telehealth: Payer: Self-pay | Admitting: Neurology

## 2014-06-28 NOTE — Telephone Encounter (Signed)
Pt is calling regarding Patient Assistance with Vimpat.  Pt has some questions and would like a call back.

## 2014-06-28 NOTE — Progress Notes (Signed)
I have reviewed and agreed above plan. 

## 2014-06-28 NOTE — Telephone Encounter (Signed)
I contacted PAP at [phone].  Spoke with [employer].  She verified the patient was approved for assistance and her meds should arrive within the next 7 days.  I called the patient back.  She is aware.

## 2014-07-04 ENCOUNTER — Telehealth: Payer: Self-pay | Admitting: *Deleted

## 2014-07-04 DIAGNOSIS — R569 Unspecified convulsions: Secondary | ICD-10-CM

## 2014-07-04 MED ORDER — LAMOTRIGINE 100 MG PO TABS: ORAL_TABLET | ORAL | Status: DC

## 2014-07-04 NOTE — Telephone Encounter (Signed)
Per Dr. [name], she should increase lamotrigine 100mg  to 2 tabs BID, keep her 07/26/14 follow up appt and come in for labs one week prior to this appt.  Patient aware and agreeable to plan.  She will call back with any further concerns.

## 2014-07-04 NOTE — Telephone Encounter (Signed)
Patient called today to report having three additional seizures since her last medication increase on 06/21/14.  Verified that she has not missed any medication doses and she is taking the following:  Lamictal 100mg , 1.5 tabs BID, Topamax 100mg , 1 tab BID and Vimpat 150mg , 1 tab BID.  Says she is experiencing blurred vision, flashes of orange and black spots, nausea and headache.  Most recent seizure occurred this morning.

## 2014-07-05 ENCOUNTER — Telehealth: Payer: Self-pay

## 2014-07-05 NOTE — Telephone Encounter (Signed)
Called patient to inform med available for pick up. No answer. Will try later. Placed med up front.

## 2014-07-07 ENCOUNTER — Telehealth: Payer: Self-pay | Admitting: *Deleted

## 2014-07-07 ENCOUNTER — Emergency Department: Admit: 2014-07-07 | Disposition: A | Discharge status: Home / Self Care | Payer: Self-pay | Admitting: Student

## 2014-07-07 NOTE — Telephone Encounter (Signed)
Katie [name]: Let patient know, she has access to her chart, we can also fax over her record, but we do not generate a separate letter.

## 2014-07-07 NOTE — Telephone Encounter (Signed)
Called and informed patient. 

## 2014-07-13 DIAGNOSIS — Z0289 Encounter for other administrative examinations: Secondary | ICD-10-CM

## 2014-07-25 ENCOUNTER — Ambulatory Visit: Payer: 59 | Admitting: Neurology

## 2014-07-26 ENCOUNTER — Ambulatory Visit: Payer: Self-pay | Admitting: Neurology

## 2014-10-13 DIAGNOSIS — Z0289 Encounter for other administrative examinations: Secondary | ICD-10-CM

## 2014-12-04 ENCOUNTER — Telehealth: Payer: Self-pay | Admitting: Radiation Oncology

## 2014-12-04 ENCOUNTER — Ambulatory Visit
Admission: RE | Admit: 2014-12-04 | Discharge: 2014-12-04 | Disposition: A | Discharge status: Home / Self Care | Payer: 59 | Source: Ambulatory Visit | Attending: Radiation Oncology | Admitting: Radiation Oncology

## 2014-12-04 NOTE — Telephone Encounter (Signed)
Patient did not show for follow up appointment. Phoned call to inquire. No answer. Left message requesting return call.

## 2014-12-11 ENCOUNTER — Ambulatory Visit: Admission: RE | Admit: 2014-12-11 | Payer: 59 | Source: Ambulatory Visit | Admitting: Radiation Oncology

## 2014-12-25 ENCOUNTER — Ambulatory Visit
Admission: RE | Admit: 2014-12-25 | Discharge: 2014-12-25 | Disposition: A | Discharge status: Home / Self Care | Payer: 59 | Source: Ambulatory Visit | Attending: Radiation Oncology | Admitting: Radiation Oncology

## 2014-12-25 ENCOUNTER — Encounter: Payer: Self-pay | Admitting: Radiation Oncology

## 2014-12-25 VITALS — BP 107/73 | HR 53 | Resp 16 | Wt 157.0 lb

## 2014-12-25 DIAGNOSIS — Q282 Arteriovenous malformation of cerebral vessels: Secondary | ICD-10-CM

## 2014-12-25 NOTE — Progress Notes (Signed)
Weight and vitals stable. Reports headaches are returning. Patient states, "I need to go get another injection for my headaches." Patient confirms that following injections her headache resolves. Reports she continues to take Zofran to manage nausea. Reports occasional diplopia. Reports blurred vision worse when riding in a car. Reports she continues to battle fatigue. Reports seizures continue involving facial numbness, ringing in the ears and body jerking. Reports her hearing is muffled.  BP 107/73 mmHg  Pulse 53  Resp 16  Wt 157 lb (71.215 kg) Wt Readings from Last 3 Encounters:  12/25/14 157 lb (71.215 kg)  06/21/14 145 lb (65.772 kg)  06/15/14 142 lb 8 oz (64.638 kg)

## 2014-12-25 NOTE — Progress Notes (Signed)
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  Weight and vitals stable. Reports headaches are returning. Patient states, "I need to go get another injection for my headaches." Patient confirms that following injections her headache resolves. Reports she continues to take Zofran to manage nausea. Reports occasional diplopia. Reports blurred vision worse when riding in a car. Reports seeing 'floaters' and different colored spots in her vision, both orange and green. She plans to see an eye doctor soon. Reports she continues to battle fatigue. Reports seizures continue involving facial numbness, ringing in the ears and body jerking. Reports seizures occur x2 daily. Notes that seizures often happen when she becomes focused on one point, this happens most while riding in the car. She follows with neurologist, Dr. [name]. Reports her hearing is muffled. Reports weakness in left leg; explains she must begin climbing stairs with her right foot to avoid falling. Last night she heard loud  dripping sounds while laying down to sleep. This has happened once before about one year and a half ago, however it was not as nearly as loud as last night.   ALLERGIES:  has No Known Allergies.  Meds: Current Outpatient Prescriptions  Medication Sig Dispense Refill  . butalbital-acetaminophen-caffeine (FIORICET) 50-325-40 MG per tablet Take 1-2 tablets by mouth every 6 (six) hours as needed for headache. 20 tablet 5  . clonazePAM (KLONOPIN) 0.5 MG tablet Take 1 tablet (0.5 mg total) by mouth 2 (two) times daily as needed for  anxiety. For anxiety 30 tablet 0  . HYDROcodone-acetaminophen (NORCO) 7.5-325 MG per tablet Take 1 tablet by mouth every 6 (six) hours as needed for moderate pain.   0  . Lacosamide 150 MG TABS Take 1 tablet (150 mg total) by mouth 2 (two) times daily. 42 tablet 0  . lamoTRIgine (LAMICTAL) 100 MG tablet 2 tabs po twice a day 90 tablet 11  . ondansetron (ZOFRAN) 4 MG tablet Take 1 tablet (4 mg total) by mouth every 8 (eight) hours as needed for nausea or vomiting. 30 tablet 5  . PARoxetine (PAXIL) 20 MG tablet Take 1 tablet (20 mg total) by mouth daily. 30 tablet 11  . topiramate (TOPAMAX) 100 MG tablet Take 1 tablet (100 mg total) by mouth 2 (two) times daily. 60 tablet 11  . [DISCONTINUED] esomeprazole (NEXIUM) 40 MG capsule Take 40 mg by mouth daily before breakfast. Dispense as written     No current facility-administered medications for this encounter.    Physical Findings: The patient is in no acute distress. Patient is alert and oriented.  vitals were not taken for this visit..  No significant changes.  Neurological: No pronator drift. Some weakness in the left hand.  Lab Findings: Lab Results  Component Value Date   WBC 5.6 03/08/2014   HGB 14.3 03/08/2014   HCT 42.0 03/08/2014   MCV 93.0 03/08/2014   PLT 183 03/08/2014    Radiographic Findings: MRI 03/23/14 AVM present surrounding gliosis   Impression:  No evidence of intracranial bleed. No significant neurological changes noted. The patient's auditory symptoms may be  related to AVM blood flow.   Plan:  Follow up in 6 months. Potentially repeat angiography sometime during 2017.   This document serves as a record of services personally performed by Tyler Pita, MD. It was created on his behalf by Arlyce Harman, a trained medical scribe. The creation of this record is based on the scribe's personal observations and the provider's statements to them. This document has been checked and approved by the attending provider.     _____________________________________  Sheral Apley. Tammi Klippel, M.D.

## 2015-01-16 ENCOUNTER — Emergency Department (HOSPITAL_COMMUNITY)
Admission: EM | Admit: 2015-01-16 | Discharge: 2015-01-16 | Disposition: A | Discharge status: Home / Self Care | Payer: No Typology Code available for payment source | Attending: Emergency Medicine | Admitting: Emergency Medicine

## 2015-01-16 ENCOUNTER — Encounter (HOSPITAL_COMMUNITY): Payer: Self-pay | Admitting: Emergency Medicine

## 2015-01-16 DIAGNOSIS — Q2730 Arteriovenous malformation, site unspecified: Secondary | ICD-10-CM | POA: Insufficient documentation

## 2015-01-16 DIAGNOSIS — Z79899 Other long term (current) drug therapy: Secondary | ICD-10-CM | POA: Diagnosis not present

## 2015-01-16 DIAGNOSIS — S199XXA Unspecified injury of neck, initial encounter: Secondary | ICD-10-CM | POA: Diagnosis not present

## 2015-01-16 DIAGNOSIS — G43909 Migraine, unspecified, not intractable, without status migrainosus: Secondary | ICD-10-CM | POA: Diagnosis not present

## 2015-01-16 DIAGNOSIS — F419 Anxiety disorder, unspecified: Secondary | ICD-10-CM | POA: Insufficient documentation

## 2015-01-16 DIAGNOSIS — I10 Essential (primary) hypertension: Secondary | ICD-10-CM | POA: Insufficient documentation

## 2015-01-16 DIAGNOSIS — Z87891 Personal history of nicotine dependence: Secondary | ICD-10-CM | POA: Insufficient documentation

## 2015-01-16 DIAGNOSIS — M797 Fibromyalgia: Secondary | ICD-10-CM | POA: Diagnosis not present

## 2015-01-16 DIAGNOSIS — Y92410 Unspecified street and highway as the place of occurrence of the external cause: Secondary | ICD-10-CM | POA: Diagnosis not present

## 2015-01-16 DIAGNOSIS — Y9389 Activity, other specified: Secondary | ICD-10-CM | POA: Insufficient documentation

## 2015-01-16 DIAGNOSIS — Z86018 Personal history of other benign neoplasm: Secondary | ICD-10-CM | POA: Insufficient documentation

## 2015-01-16 DIAGNOSIS — Y998 Other external cause status: Secondary | ICD-10-CM | POA: Insufficient documentation

## 2015-01-16 DIAGNOSIS — F329 Major depressive disorder, single episode, unspecified: Secondary | ICD-10-CM | POA: Insufficient documentation

## 2015-01-16 DIAGNOSIS — R569 Unspecified convulsions: Secondary | ICD-10-CM | POA: Diagnosis present

## 2015-01-16 DIAGNOSIS — S0990XA Unspecified injury of head, initial encounter: Secondary | ICD-10-CM | POA: Insufficient documentation

## 2015-01-16 LAB — CBC WITH DIFFERENTIAL/PLATELET
BASOS PCT: 0 %
Basophils Absolute: 0 10*3/uL (ref 0.0–0.1)
Eosinophils Absolute: 0.1 10*3/uL (ref 0.0–0.7)
Eosinophils Relative: 1 %
HEMATOCRIT: 39.4 % (ref 36.0–46.0)
HEMOGLOBIN: 13.5 g/dL (ref 12.0–15.0)
LYMPHS ABS: 1.7 10*3/uL (ref 0.7–4.0)
Lymphocytes Relative: 24 %
MCH: 32.1 pg (ref 26.0–34.0)
MCHC: 34.3 g/dL (ref 30.0–36.0)
MCV: 93.8 fL (ref 78.0–100.0)
MONOS PCT: 6 %
Monocytes Absolute: 0.4 10*3/uL (ref 0.1–1.0)
NEUTROS ABS: 4.9 10*3/uL (ref 1.7–7.7)
NEUTROS PCT: 69 %
Platelets: 167 10*3/uL (ref 150–400)
RBC: 4.2 MIL/uL (ref 3.87–5.11)
RDW: 12.9 % (ref 11.5–15.5)
WBC: 7 10*3/uL (ref 4.0–10.5)

## 2015-01-16 LAB — BASIC METABOLIC PANEL
ANION GAP: 7 (ref 5–15)
BUN: 12 mg/dL (ref 6–20)
CALCIUM: 9 mg/dL (ref 8.9–10.3)
CHLORIDE: 110 mmol/L (ref 101–111)
CO2: 25 mmol/L (ref 22–32)
Creatinine, Ser: 0.66 mg/dL (ref 0.44–1.00)
GFR calc non Af Amer: >60 mL/min (ref >60)
Glucose, Bld: 91 mg/dL (ref 65–99)
Potassium: 3.9 mmol/L (ref 3.5–5.1)
Sodium: 142 mmol/L (ref 135–145)

## 2015-01-16 LAB — RAPID URINE DRUG SCREEN, HOSP PERFORMED
Amphetamines: NOT DETECTED
Barbiturates: NOT DETECTED
Benzodiazepines: NOT DETECTED
Cocaine: NOT DETECTED
OPIATES: POSITIVE — AB
Tetrahydrocannabinol: NOT DETECTED

## 2015-01-16 NOTE — ED Notes (Signed)
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
## 2015-01-16 NOTE — Discharge Instructions (Signed)
Cervical Sprain A cervical sprain is an injury in the neck in which the strong, fibrous tissues (ligaments) that connect your neck bones stretch or tear. Cervical sprains can range from mild to severe. Severe cervical sprains can cause the neck vertebrae to be unstable. This can lead to damage of the spinal cord and can result in serious nervous system problems. The amount of time it takes for a cervical sprain to get better depends on the cause and extent of the injury. Most cervical sprains heal in 1 to 3 weeks. CAUSES  Severe cervical sprains may be caused by:   Contact sport injuries (such as from football, rugby, wrestling, hockey, auto racing, gymnastics, diving, martial arts, or boxing).   Motor vehicle collisions.   Whiplash injuries. This is an injury from a sudden forward and backward whipping movement of the head and neck.  Falls.  Mild cervical sprains may be caused by:   Being in an awkward position, such as while cradling a telephone between your ear and shoulder.   Sitting in a chair that does not offer proper support.   Working at a poorly Landscape architect station.   Looking up or down for long periods of time.  SYMPTOMS   Pain, soreness, stiffness, or a burning sensation in the front, back, or sides of the neck. This discomfort may develop immediately after the injury or slowly, 24 hours or more after the injury.   Pain or tenderness directly in the middle of the back of the neck.   Shoulder or upper back pain.   Limited ability to move the neck.   Headache.   Dizziness.   Weakness, numbness, or tingling in the hands or arms.   Muscle spasms.   Difficulty swallowing or chewing.   Tenderness and swelling of the neck.  DIAGNOSIS  Most of the time your health care provider can diagnose a cervical sprain by taking your history and doing a physical exam. Your health care provider will ask about previous neck injuries and any known neck  problems, such as arthritis in the neck. X-rays may be taken to find out if there are any other problems, such as with the bones of the neck. Other tests, such as a CT scan or MRI, may also be needed.  TREATMENT  Treatment depends on the severity of the cervical sprain. Mild sprains can be treated with rest, keeping the neck in place (immobilization), and pain medicines. Severe cervical sprains are immediately immobilized. Further treatment is done to help with pain, muscle spasms, and other symptoms and may include:  Medicines, such as pain relievers, numbing medicines, or muscle relaxants.   Physical therapy. This may involve stretching exercises, strengthening exercises, and posture training. Exercises and improved posture can help stabilize the neck, strengthen muscles, and help stop symptoms from returning.  HOME CARE INSTRUCTIONS   Put ice on the injured area.   Put ice in a plastic bag.   Place a towel between your skin and the bag.   Leave the ice on for 15-20 minutes, 3-4 times a day.   If your injury was severe, you may have been given a cervical collar to wear. A cervical collar is a two-piece collar designed to keep your neck from moving while it heals.  Do not remove the collar unless instructed by your health care provider.  If you have long hair, keep it outside of the collar.  Ask your health care provider before making any adjustments to your collar. Minor  adjustments may be required over time to improve comfort and reduce pressure on your chin or on the back of your head.  Ifyou are allowed to remove the collar for cleaning or bathing, follow your health care provider's instructions on how to do so safely.  Keep your collar clean by wiping it with mild soap and water and drying it completely. If the collar you have been given includes removable pads, remove them every 1-2 days and hand wash them with soap and water. Allow them to air dry. They should be completely  dry before you wear them in the collar.  If you are allowed to remove the collar for cleaning and bathing, wash and dry the skin of your neck. Check your skin for irritation or sores. If you see any, tell your health care provider.  Do not drive while wearing the collar.   Only take over-the-counter or prescription medicines for pain, discomfort, or fever as directed by your health care provider.   Keep all follow-up appointments as directed by your health care provider.   Keep all physical therapy appointments as directed by your health care provider.   Make any needed adjustments to your workstation to promote good posture.   Avoid positions and activities that make your symptoms worse.   Warm up and stretch before being active to help prevent problems.  SEEK MEDICAL CARE IF:   Your pain is not controlled with medicine.   You are unable to decrease your pain medicine over time as planned.   Your activity level is not improving as expected.  SEEK IMMEDIATE MEDICAL CARE IF:   You develop any bleeding.  You develop stomach upset.  You have signs of an allergic reaction to your medicine.   Your symptoms get worse.   You develop new, unexplained symptoms.   You have numbness, tingling, weakness, or paralysis in any part of your body.  MAKE SURE YOU:   Understand these instructions.  Will watch your condition.  Will get help right away if you are not doing well or get worse.   This information is not intended to replace advice given to you by your health care provider. Make sure you discuss any questions you have with your health care provider.   Document Released: 01/05/2007 Document Revised: 03/15/2013 Document Reviewed: 09/15/2012 Elsevier Interactive Patient Education [employer].  Seizure, Adult A seizure is abnormal electrical activity in the brain. Seizures usually last from 30 seconds to 2 minutes. There are various types of  seizures. Before a seizure, you may have a warning sensation (aura) that a seizure is about to occur. An aura may include the following symptoms:   Fear or anxiety.  Nausea.  Feeling like the room is spinning (vertigo).  Vision changes, such as seeing flashing lights or spots. Common symptoms during a seizure include:  A change in attention or behavior (altered mental status).  Convulsions with rhythmic jerking movements.  Drooling.  Rapid eye movements.  Grunting.  Loss of bladder and bowel control.  Bitter taste in the mouth.  Tongue biting. After a seizure, you may feel confused and sleepy. You may also have an injury resulting from convulsions during the seizure. HOME CARE INSTRUCTIONS   If you are given medicines, take them exactly as prescribed by your health care provider.  Keep all follow-up appointments as directed by your health care provider.  Do not swim or drive or engage in risky activity during which a seizure could cause further injury  to you or others until your health care provider says it is OK.  Get adequate rest.  Teach friends and family what to do if you have a seizure. They should:  Lay you on the ground to prevent a fall.  Put a cushion under your head.  Loosen any tight clothing around your neck.  Turn you on your side. If vomiting occurs, this helps keep your airway clear.  Stay with you until you recover.  Know whether or not you need emergency care. SEEK IMMEDIATE MEDICAL CARE IF:  The seizure lasts longer than 5 minutes.  The seizure is severe or you do not wake up immediately after the seizure.  You have an altered mental status after the seizure.  You are having more frequent or worsening seizures. Someone should drive you to the emergency department or call local emergency services (911 in [place] MAKE SURE YOU:  Understand these instructions.  Will watch your condition.  Will get help right away if you are not doing  well or get worse.   This information is not intended to replace advice given to you by your health care provider. Make sure you discuss any questions you have with your health care provider.   Document Released: 03/07/2000 Document Revised: 03/31/2014 Document Reviewed: 10/20/2012 Elsevier Interactive Patient Education [employer].

## 2015-01-16 NOTE — ED Notes (Signed)
Pt is in stable condition upon d/c and is escorted from ED via wheelchair. 
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
## 2015-01-25 ENCOUNTER — Ambulatory Visit (INDEPENDENT_AMBULATORY_CARE_PROVIDER_SITE_OTHER): Payer: 59 | Admitting: Neurology

## 2015-01-25 ENCOUNTER — Other Ambulatory Visit: Payer: Self-pay | Admitting: Neurology

## 2015-01-25 ENCOUNTER — Telehealth: Payer: Self-pay | Admitting: *Deleted

## 2015-01-25 ENCOUNTER — Encounter: Payer: Self-pay | Admitting: Neurology

## 2015-01-25 VITALS — BP 119/79 | HR 69 | Ht 62.0 in | Wt 157.0 lb

## 2015-01-25 DIAGNOSIS — IMO0002 Reserved for concepts with insufficient information to code with codable children: Secondary | ICD-10-CM

## 2015-01-25 DIAGNOSIS — Q283 Other malformations of cerebral vessels: Secondary | ICD-10-CM | POA: Diagnosis not present

## 2015-01-25 DIAGNOSIS — R569 Unspecified convulsions: Secondary | ICD-10-CM | POA: Diagnosis not present

## 2015-01-25 DIAGNOSIS — G43709 Chronic migraine without aura, not intractable, without status migrainosus: Secondary | ICD-10-CM | POA: Insufficient documentation

## 2015-01-25 DIAGNOSIS — Q282 Arteriovenous malformation of cerebral vessels: Secondary | ICD-10-CM

## 2015-01-25 HISTORY — DX: Reserved for concepts with insufficient information to code with codable children: IMO0002

## 2015-01-25 MED ORDER — TOPIRAMATE 100 MG PO TABS: 100.0000 mg | ORAL_TABLET | Freq: Two times a day (BID) | ORAL | Status: DC

## 2015-01-25 MED ORDER — LACOSAMIDE 150 MG PO TABS: 150.0000 mg | ORAL_TABLET | Freq: Two times a day (BID) | ORAL | Status: DC

## 2015-01-25 MED ORDER — BUTALBITAL-APAP-CAFFEINE 50-325-40 MG PO TABS: 1.0000 | ORAL_TABLET | Freq: Four times a day (QID) | ORAL | Status: AC | PRN

## 2015-01-25 MED ORDER — LAMOTRIGINE ER 200 MG PO TB24: 400.0000 mg | ORAL_TABLET | Freq: Every day | ORAL | Status: DC

## 2015-01-25 NOTE — Telephone Encounter (Signed)
Pt called would like refill for butalbital-acetaminophen-caffeine (FIORICET) 50-325-40 MG per tablet . HA is still persistent.  Please call and advise at [phone] or (h) [phone]. Will be in [place] for another hour.
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
## 2015-01-25 NOTE — Telephone Encounter (Signed)
Request entered, forwarded to provider for approval.  

## 2015-01-25 NOTE — Telephone Encounter (Signed)
Rx for generic Fioricet signed, faxed and confirmed to the pharmacy at [phone].

## 2015-02-01 ENCOUNTER — Other Ambulatory Visit: Payer: Self-pay | Admitting: *Deleted

## 2015-02-01 DIAGNOSIS — G43719 Chronic migraine without aura, intractable, without status migrainosus: Secondary | ICD-10-CM

## 2015-02-01 MED ORDER — ONABOTULINUMTOXINA 200 UNITS IJ SOLR: INTRAMUSCULAR | Status: DC

## 2015-02-03 ENCOUNTER — Emergency Department
Admission: EM | Admit: 2015-02-03 | Discharge: 2015-02-03 | Disposition: A | Discharge status: Home / Self Care | Payer: 59 | Attending: Emergency Medicine | Admitting: Emergency Medicine

## 2015-02-03 ENCOUNTER — Encounter: Payer: Self-pay | Admitting: Emergency Medicine

## 2015-02-03 DIAGNOSIS — L508 Other urticaria: Secondary | ICD-10-CM

## 2015-02-03 DIAGNOSIS — L509 Urticaria, unspecified: Secondary | ICD-10-CM | POA: Insufficient documentation

## 2015-02-03 DIAGNOSIS — Z792 Long term (current) use of antibiotics: Secondary | ICD-10-CM | POA: Insufficient documentation

## 2015-02-03 DIAGNOSIS — B354 Tinea corporis: Secondary | ICD-10-CM | POA: Insufficient documentation

## 2015-02-03 DIAGNOSIS — I10 Essential (primary) hypertension: Secondary | ICD-10-CM | POA: Diagnosis not present

## 2015-02-03 DIAGNOSIS — Z79899 Other long term (current) drug therapy: Secondary | ICD-10-CM | POA: Diagnosis not present

## 2015-02-03 DIAGNOSIS — Z87891 Personal history of nicotine dependence: Secondary | ICD-10-CM | POA: Insufficient documentation

## 2015-02-03 DIAGNOSIS — Z7952 Long term (current) use of systemic steroids: Secondary | ICD-10-CM | POA: Diagnosis not present

## 2015-02-03 DIAGNOSIS — R21 Rash and other nonspecific skin eruption: Secondary | ICD-10-CM | POA: Diagnosis present

## 2015-02-03 MED ORDER — RANITIDINE HCL 150 MG PO TABS: 150.0000 mg | ORAL_TABLET | Freq: Two times a day (BID) | ORAL | Status: AC

## 2015-02-03 MED ORDER — CLOTRIMAZOLE-BETAMETHASONE 1-0.05 % EX CREA: TOPICAL_CREAM | CUTANEOUS | Status: AC

## 2015-02-03 MED ORDER — FAMOTIDINE 20 MG PO TABS
20.0000 mg | ORAL_TABLET | Freq: Once | ORAL | Status: AC
  Administered 2015-02-03: 20 mg via ORAL
  Filled 2015-02-03: qty 1

## 2015-02-03 MED ORDER — PREDNISONE 10 MG PO TABS: ORAL_TABLET | ORAL | Status: DC

## 2015-02-03 MED ORDER — DEXAMETHASONE SODIUM PHOSPHATE 10 MG/ML IJ SOLN
10.0000 mg | Freq: Once | INTRAMUSCULAR | Status: AC
  Administered 2015-02-03: 10 mg via INTRAMUSCULAR
  Filled 2015-02-03: qty 1

## 2015-02-03 MED ORDER — DIPHENHYDRAMINE HCL 25 MG PO CAPS
50.0000 mg | ORAL_CAPSULE | Freq: Once | ORAL | Status: AC
  Administered 2015-02-03: 50 mg via ORAL
  Filled 2015-02-03: qty 2

## 2015-02-03 NOTE — Discharge Instructions (Signed)
Hives Hives are itchy, red, puffy (swollen) areas of the skin. Hives can change in size and location on your body. Hives can come and go for hours, days, or weeks. Hives do not spread from person to person (noncontagious). Scratching, exercise, and stress can make your hives worse. HOME CARE  Avoid things that cause your hives (triggers).  Take antihistamine medicines as told by your doctor. Do not drive while taking an antihistamine.  Take any other medicines for itching as told by your doctor.  Wear loose-fitting clothing.  Keep all doctor visits as told. GET HELP RIGHT AWAY IF:   You have a fever.  Your tongue or lips are puffy.  You have trouble breathing or swallowing.  You feel tightness in the throat or chest.  You have belly (abdominal) pain.  You have lasting or severe itching that is not helped by medicine.  You have painful or puffy joints. These problems may be the first sign of a life-threatening allergic reaction. Call your local emergency services (911 in [place] MAKE SURE YOU:   Understand these instructions.  Will watch your condition.  Will get help right away if you are not doing well or get worse.   This information is not intended to replace advice given to you by your health care provider. Make sure you discuss any questions you have with your health care provider.   Document Released: 12/18/2007 Document Revised: 09/09/2011 Document Reviewed: 06/03/2011 Elsevier Interactive Patient Education 2016 [place] with your doctor if any continued problems. Continue taking Benadryl as needed for itching. Prednisone began today with 6 tablets and continued taper each day. Zantac twice a day as needed for itching. Return to the emergency room if any severe worsening of her symptoms. Follow-up with allergy testing if any continued problems after this episode.

## 2015-02-03 NOTE — ED Provider Notes (Signed)
[facility] Emergency Department Provider Note  ____________________________________________  Time seen: Approximately 11:13 AM  I have reviewed the triage vital signs and the nursing notes.   HISTORY  Chief Complaint Rash   HPI Katie [name] is a 39 y.o. female is here with complaint of rash. Patient states that last evening he began on her left forearm. Now she states that she's had for rash on her leg that comes and goes. She also has a rash on her torso and left leg. She has waves of itching but also comes and goes. She describes rash is red with bumps in the center that does appear from time to time. She is unaware of any environmental, food or allergic contacts. She denies any previous problems such as asthma or allergies. Last evening she took one Benadryl which helped minimally. Today the itching is worse.She denies any pain at this time is 0/10.. She denies any difficulty breathing or swallowing.   Past Medical History  Diagnosis Date  . Fibromyalgia   . Migraine   . Hypertension   . Depression   . Anxiety   . AVM (arteriovenous malformation)   . Cerebral aneurysm   . Seizures ([place])   . Liver hemangioma     Patient Active Problem List   Diagnosis Date Noted  . Chronic migraine 01/25/2015  . Left-sided weakness 03/08/2014  . Headache   . Seizures ([place]) 06/03/2013  . Frequent headaches 06/03/2013  . AVM (arteriovenous malformation) brain 02/22/2013  . Cerebral aneurysm   . Myalgia and myositis 02/25/2012  . TORTICOLLIS 06/10/2010  . PAIN IN JOINT, MULTIPLE SITES 03/27/2010  . FREQUENCY, URINARY 03/01/2010  . HIP PAIN, LEFT 01/28/2010  . LIVER HEMANGIOMA 06/07/2009  . SHOULDER PAIN, RIGHT 10/31/2008  . DYSMENORRHEA 10/12/2008  . HEADACHE, TENSION 05/11/2008  . ALLERGIC RHINITIS 01/05/2008  . OVARIAN CYST, LEFT 11/03/2007  . OBESITY 06/04/2007  . ANXIETY 03/04/2007  . DEPRESSION 03/04/2007  . GERD 03/04/2007  . NEPHROLITHIASIS, HX OF  03/04/2007    Past Surgical History  Procedure Laterality Date  . Tubal ligation    . Cerebral embolization  dec 2013  . Wisdom tooth extraction  2001  . Radiation brain    . Dilitation & currettage/hystroscopy with novasure ablation N/A 08/04/2013    Procedure: DILATATION & CURETTAGE/HYSTEROSCOPY WITH NOVASURE ABLATION;  Surgeon: Annalee Genta, DO;  Location: [facility];  Service: Gynecology;  Laterality: N/A;  . Cerebral angiogram      Current Outpatient Rx  Name  Route  Sig  Dispense  Refill  . Botulinum Toxin Type A 200 UNITS SOLR      Botox injections every three months.   1 each   3   . butalbital-acetaminophen-caffeine (FIORICET) 50-325-40 MG tablet   Oral   Take 1-2 tablets by mouth every 6 (six) hours as needed for headache.   20 tablet   5     Pharmacy Fax [phone]   . clonazePAM (KLONOPIN) 1 MG tablet            3   . clotrimazole-betamethasone (LOTRISONE) cream      Apply to affected area 2 times daily   15 g   1   . HYDROcodone-acetaminophen (NORCO) 7.5-325 MG per tablet   Oral   Take 1 tablet by mouth every 6 (six) hours as needed for moderate pain.       0   . Lacosamide (VIMPAT) 150 MG TABS   Oral   Take 1 tablet (  150 mg total) by mouth 2 (two) times daily.   60 tablet   11   . LamoTRIgine XR 200 MG TB24   Oral   Take 2 tablets (400 mg total) by mouth at bedtime.   60 tablet   11   . methocarbamol (ROBAXIN) 750 MG tablet      TK 2 TS PO TID      3   . metroNIDAZOLE (METROGEL) 0.75 % vaginal gel      USE UTD TWICE WEEKLY TO PREVENT BACTERIAL VAGINOSIS      9   . mupirocin ointment (BACTROBAN) 2 %      APPLY A THIN LAYER TO NOSTRILS TID FOR 5 DAYS THEN PRN FOR SKIN LESIONS      0   . ondansetron (ZOFRAN) 4 MG tablet   Oral   Take 1 tablet (4 mg total) by mouth every 8 (eight) hours as needed for nausea or vomiting.   30 tablet   5   . PARoxetine (PAXIL) 40 MG tablet      TK 1 T PO ONCE A DAY IN THE MORNING      3   .  predniSONE (DELTASONE) 10 MG tablet      Take 6 tablets  today, on day 2 take 5 tablets, day 3 take 4 tablets, day 4 take 3 tablets, day 5 take  2 tablets and 1 tablet the last day   21 tablet   0   . ranitidine (ZANTAC) 150 MG tablet   Oral   Take 1 tablet (150 mg total) by mouth 2 (two) times daily.   30 tablet   1   . topiramate (TOPAMAX) 100 MG tablet   Oral   Take 1 tablet (100 mg total) by mouth 2 (two) times daily.   60 tablet   11     Allergies Review of patient's allergies indicates no known allergies.  Family History  Problem Relation Age of Onset  . Bipolar disorder Mother   . Thyroid disease Maternal Grandmother   . Glaucoma Maternal Grandmother   . Aneurysm Father     Social History Social History  Substance Use Topics  . Smoking status: Former Research scientist (life sciences)  . Smokeless tobacco: Never Used     Comment: Quit 2007  . Alcohol Use: 0.6 oz/week    1 Cans of beer per week     Comment: occasional    Review of Systems Constitutional: No fever/chills Eyes: No visual changes. ENT: No sore throat. Cardiovascular: Denies chest pain. Respiratory: Denies shortness of breath. Gastrointestinal: No abdominal pain.  No nausea, no vomiting. . Genitourinary: Negative for dysuria. Musculoskeletal: Negative for back pain. Skin: Negative for rash. Neurological: Negative for headaches, focal weakness or numbness.  10-point ROS otherwise negative.  ____________________________________________   PHYSICAL EXAM:  VITAL SIGNS: ED Triage Vitals  Enc Vitals Group     BP --      Pulse --      Resp --      Temp --      Temp src --      SpO2 --      Weight --      Height --      Head Cir --      Peak Flow --      Pain Score --      Pain Loc --      Pain Edu? --      Excl. in [place]? --  Constitutional: Alert and oriented. Well appearing and in no acute distress. Eyes: Conjunctivae are normal. PERRL. EOMI. Head: Atraumatic. Nose: No congestion/rhinnorhea. Neck:  No stridor.   Cardiovascular: Normal rate, regular rhythm. Grossly normal heart sounds.  Good peripheral circulation. Respiratory: Normal respiratory effort.  No retractions. Lungs CTAB. Gastrointestinal: Soft and nontender. No distention.  Musculoskeletal: No lower extremity tenderness nor edema.  No joint effusions. Neurologic:  Normal speech and language. No gross focal neurologic deficits are appreciated. No gait instability. Skin:  Skin is warm, dry and intact. There is a erythematous macular papular rash irregular in shape over both lower extremities, abdomen and right arm. Patient is actively scratching at this time. There is also a circular area with central clearing that is very suspicious for ringworm. Patient states she has been using over-the-counter medication without any relief. Psychiatric: Mood and affect are normal. Speech and behavior are normal.  ____________________________________________   LABS (all labs ordered are listed, but only abnormal results are displayed)  Labs Reviewed - No data to display  PROCEDURES  Procedure(s) performed: None  Critical Care performed: No  ____________________________________________   INITIAL IMPRESSION / ASSESSMENT AND PLAN / ED COURSE  Pertinent labs & imaging results that were available during my care of the patient were reviewed by me and considered in my medical decision making (see chart for details).  Prior discharge patient to sit there is decreased itching and rash is diminishing somewhat. She is more comfortable and she has been all day. She was discharged with prescription prednisone Dosepak 6 day taper along with Zantac 150 mg 1 twice a day. She is also given a prescription for Lotrisone cream for a ringworm that she has. She'll continue Benadryl as needed for itching. She'll also follow up if she continues to have problems as she is unsure what the allergen may be but suspects that it could be the cats and she broke out  within 15 minutes of exposure. ____________________________________________   FINAL CLINICAL IMPRESSION(S) / ED DIAGNOSES  Final diagnoses:  Urticaria, acute  Tinea corporis      Johnn Hai, PA-C 02/03/15 1612  Ahmed Prima, MD 02/04/15 [phone]

## 2015-02-11 ENCOUNTER — Emergency Department
Admission: EM | Admit: 2015-02-11 | Discharge: 2015-02-11 | Disposition: A | Discharge status: Home / Self Care | Payer: 59 | Attending: Emergency Medicine | Admitting: Emergency Medicine

## 2015-02-11 ENCOUNTER — Encounter: Payer: Self-pay | Admitting: Emergency Medicine

## 2015-02-11 DIAGNOSIS — T782XXD Anaphylactic shock, unspecified, subsequent encounter: Secondary | ICD-10-CM | POA: Insufficient documentation

## 2015-02-11 DIAGNOSIS — I10 Essential (primary) hypertension: Secondary | ICD-10-CM | POA: Insufficient documentation

## 2015-02-11 DIAGNOSIS — Z87891 Personal history of nicotine dependence: Secondary | ICD-10-CM | POA: Insufficient documentation

## 2015-02-11 DIAGNOSIS — T7840XD Allergy, unspecified, subsequent encounter: Secondary | ICD-10-CM | POA: Insufficient documentation

## 2015-02-11 DIAGNOSIS — X58XXXD Exposure to other specified factors, subsequent encounter: Secondary | ICD-10-CM | POA: Insufficient documentation

## 2015-02-11 DIAGNOSIS — Z79899 Other long term (current) drug therapy: Secondary | ICD-10-CM | POA: Diagnosis not present

## 2015-02-11 MED ORDER — FAMOTIDINE IN NACL 20-0.9 MG/50ML-% IV SOLN
20.0000 mg | Freq: Once | INTRAVENOUS | Status: AC
  Administered 2015-02-11: 20 mg via INTRAVENOUS
  Filled 2015-02-11: qty 50

## 2015-02-11 MED ORDER — METHYLPREDNISOLONE SODIUM SUCC 125 MG IJ SOLR
125.0000 mg | Freq: Once | INTRAMUSCULAR | Status: AC
  Administered 2015-02-11: 125 mg via INTRAVENOUS
  Filled 2015-02-11: qty 2

## 2015-02-11 MED ORDER — PREDNISONE 10 MG PO TABS: 50.0000 mg | ORAL_TABLET | Freq: Every day | ORAL | Status: DC

## 2015-02-11 MED ORDER — DIPHENHYDRAMINE HCL 50 MG/ML IJ SOLN
25.0000 mg | Freq: Once | INTRAMUSCULAR | Status: AC
  Administered 2015-02-11: 25 mg via INTRAVENOUS
  Filled 2015-02-11: qty 1

## 2015-02-11 MED ORDER — EPINEPHRINE 0.3 MG/0.3ML IJ SOAJ: 0.3000 mg | Freq: Once | INTRAMUSCULAR | Status: AC

## 2015-02-11 NOTE — ED Provider Notes (Signed)
[facility] Emergency Department Provider Note   ____________________________________________  Time seen: 8:30 AM I have reviewed the triage vital signs and the triage nursing note.  HISTORY  Chief Complaint Allergic Reaction   Historian Patient  HPI Katie [name] is a 39 y.o. female is here for evaluation of diffuse hives as well as lip swelling and feeling of right-sided throat fullness. This started yesterday. Patient has had problems with allergic reaction to unknown irritant over the past several weeks. She does have an appointment set up to see a dermatologist as well as an allergist. She is currently on Zyrtec and Zantac at home. She has tried Benadryl with no relief. She was told to return if she had any face or lip swelling, which is why she is here. No new skin irritants. They're planning to get rather cats to see if that might be what she is reacting to, however they've had cats for quite some time.    Past Medical History  Diagnosis Date  . Fibromyalgia   . Migraine   . Hypertension   . Depression   . Anxiety   . AVM (arteriovenous malformation)   . Cerebral aneurysm   . Seizures ([place])   . Liver hemangioma     Patient Active Problem List   Diagnosis Date Noted  . Chronic migraine 01/25/2015  . Left-sided weakness 03/08/2014  . Headache   . Seizures ([place]) 06/03/2013  . Frequent headaches 06/03/2013  . AVM (arteriovenous malformation) brain 02/22/2013  . Cerebral aneurysm   . Myalgia and myositis 02/25/2012  . TORTICOLLIS 06/10/2010  . PAIN IN JOINT, MULTIPLE SITES 03/27/2010  . FREQUENCY, URINARY 03/01/2010  . HIP PAIN, LEFT 01/28/2010  . LIVER HEMANGIOMA 06/07/2009  . SHOULDER PAIN, RIGHT 10/31/2008  . DYSMENORRHEA 10/12/2008  . HEADACHE, TENSION 05/11/2008  . ALLERGIC RHINITIS 01/05/2008  . OVARIAN CYST, LEFT 11/03/2007  . OBESITY 06/04/2007  . ANXIETY 03/04/2007  . DEPRESSION 03/04/2007  . GERD 03/04/2007  .  NEPHROLITHIASIS, HX OF 03/04/2007    Past Surgical History  Procedure Laterality Date  . Tubal ligation    . Cerebral embolization  dec 2013  . Wisdom tooth extraction  2001  . Radiation brain    . Dilitation & currettage/hystroscopy with novasure ablation N/A 08/04/2013    Procedure: DILATATION & CURETTAGE/HYSTEROSCOPY WITH NOVASURE ABLATION;  Surgeon: Annalee Genta, DO;  Location: [facility];  Service: Gynecology;  Laterality: N/A;  . Cerebral angiogram      Current Outpatient Rx  Name  Route  Sig  Dispense  Refill  . Botulinum Toxin Type A 200 UNITS SOLR      Botox injections every three months.   1 each   3   . butalbital-acetaminophen-caffeine (FIORICET) 50-325-40 MG tablet   Oral   Take 1-2 tablets by mouth every 6 (six) hours as needed for headache.   20 tablet   5     Pharmacy Fax [phone]   . clonazePAM (KLONOPIN) 1 MG tablet            3   . clotrimazole-betamethasone (LOTRISONE) cream      Apply to affected area 2 times daily   15 g   1   . EPINEPHrine (EPIPEN 2-PAK) 0.3 mg/0.3 mL IJ SOAJ injection   Intramuscular   Inject 0.3 mLs (0.3 mg total) into the muscle once.   1 Device   2   . HYDROcodone-acetaminophen (NORCO) 7.5-325 MG per tablet   Oral   Take 1  tablet by mouth every 6 (six) hours as needed for moderate pain.       0   . Lacosamide (VIMPAT) 150 MG TABS   Oral   Take 1 tablet (150 mg total) by mouth 2 (two) times daily.   60 tablet   11   . LamoTRIgine XR 200 MG TB24   Oral   Take 2 tablets (400 mg total) by mouth at bedtime.   60 tablet   11   . methocarbamol (ROBAXIN) 750 MG tablet      TK 2 TS PO TID      3   . metroNIDAZOLE (METROGEL) 0.75 % vaginal gel      USE UTD TWICE WEEKLY TO PREVENT BACTERIAL VAGINOSIS      9   . mupirocin ointment (BACTROBAN) 2 %      APPLY A THIN LAYER TO NOSTRILS TID FOR 5 DAYS THEN PRN FOR SKIN LESIONS      0   . ondansetron (ZOFRAN) 4 MG tablet   Oral   Take 1 tablet (4 mg total)  by mouth every 8 (eight) hours as needed for nausea or vomiting.   30 tablet   5   . PARoxetine (PAXIL) 40 MG tablet      TK 1 T PO ONCE A DAY IN THE MORNING      3   . predniSONE (DELTASONE) 10 MG tablet   Oral   Take 5 tablets (50 mg total) by mouth daily. Take 50 mg by mouth daily for 4 more days Then take 40 mg by mouth daily for 1 day Then take 30 mg by mouth daily for 1 day Then take 20 mg by mouth daily for 1 day Then take 10 mg by mouth for 1 day.   30 tablet   0   . ranitidine (ZANTAC) 150 MG tablet   Oral   Take 1 tablet (150 mg total) by mouth 2 (two) times daily.   30 tablet   1   . topiramate (TOPAMAX) 100 MG tablet   Oral   Take 1 tablet (100 mg total) by mouth 2 (two) times daily.   60 tablet   11     Allergies Review of patient's allergies indicates no known allergies.  Family History  Problem Relation Age of Onset  . Bipolar disorder Mother   . Thyroid disease Maternal Grandmother   . Glaucoma Maternal Grandmother   . Aneurysm Father     Social History Social History  Substance Use Topics  . Smoking status: Former Research scientist (life sciences)  . Smokeless tobacco: Never Used     Comment: Quit 2007  . Alcohol Use: 0.6 oz/week    1 Cans of beer per week     Comment: occasional    Review of Systems  Constitutional: Negative for fever. Eyes: Negative for visual changes. ENT: Negative for sore throat. Cardiovascular: Negative for chest pain. Respiratory: Negative for shortness of breath. Gastrointestinal: Negative for abdominal pain, vomiting and diarrhea. Genitourinary: Negative for dysuria. Musculoskeletal: Negative for back pain. Skin: Positive for rash/hives Neurological: Negative for headache. 10 point Review of Systems otherwise negative ____________________________________________   PHYSICAL EXAM:  VITAL SIGNS: ED Triage Vitals  Enc Vitals Group     BP 02/11/15 0754 110/70 mmHg     Pulse Rate 02/11/15 0754 82     Resp 02/11/15 0754 17      Temp 02/11/15 0754 98.1 F (36.7 C)     Temp Source 02/11/15 0754 Oral  SpO2 02/11/15 0754 98 %     Weight 02/11/15 0754 157 lb (71.215 kg)     Height 02/11/15 0754 5\' 2"  (1.575 m)     Head Cir --      Peak Flow --      Pain Score 02/11/15 0751 4     Pain Loc --      Pain Edu? --      Excl. in [place]? --      Constitutional: Alert and oriented. Well appearing and in no distress. Eyes: Conjunctivae are normal. PERRL. Normal extraocular movements. ENT   Head: Normocephalic and atraumatic.   Nose: No congestion/rhinnorhea.   Mouth/Throat: Mucous membranes are moist. No pharyngeal erythema or swelling visualized. Tongue normal size.  Patient states those are swollen, however they do not appear swollen visually to my eye, however they are to the patient and her significant other   Neck: No stridor. Cardiovascular/Chest: Normal rate, regular rhythm.  No murmurs, rubs, or gallops. Respiratory: Normal respiratory effort without tachypnea nor retractions. Breath sounds are clear and equal bilaterally. No wheezes/rales/rhonchi. Gastrointestinal: Soft. No distention, no guarding, no rebound. Nontender   Genitourinary/rectal:Deferred Musculoskeletal: Nontender with normal range of motion in all extremities. No joint effusions.  No lower extremity tenderness.  Swelling/edema associated with high Neurologic:  Normal speech and language. No gross or focal neurologic deficits are appreciated. Skin:  Skin is warm, dry and intact. Large areas of hives over chest, back of the trunk, arms and legs. Psychiatric: Mood and affect are normal. Speech and behavior are normal. Patient exhibits appropriate insight and judgment.  ____________________________________________   EKG I, Lisa Roca, MD, the attending physician have personally viewed and interpreted all ECGs.  No EKG performed ____________________________________________  LABS (pertinent  positives/negatives)  None  ____________________________________________  RADIOLOGY All Xrays were viewed by me. Imaging interpreted by Radiologist.  None __________________________________________  PROCEDURES  Procedure(s) performed: None  Critical Care performed: None  ____________________________________________   ED COURSE / ASSESSMENT AND PLAN  CONSULTATIONS: None  Pertinent labs & imaging results that were available during my care of the patient were reviewed by me and considered in my medical decision making (see chart for details).   Patient is having a systemic allergic reaction.  No acute concern about airway, however given the patient's complaint of lip swelling and fullness of the right throat, patient was given IV medications for a systemic allergic reaction. Patient is stable at present.  Unknown allergic irritant/precipitant.  ----------------------------------------- 12:36 PM on 02/11/2015 -----------------------------------------  Patient did have resolution of hives and itching as well as lip swelling and throat discomfort. At approximately 12:30 she did have recurrence of hives on her arms. This is an approximate 4 hours after Benadryl. Patient was given a repeat dose of Benadryl. We discussed the return precautions, and patient is comfortable going home with her significant other this point time. She will go pick up an EpiPen right now. We discussed return precautions and they're comfortable with observing and bring her back if necessary.  Patient / Family / Caregiver informed of clinical course, medical decision-making process, and agree with plan.   I discussed return precautions, follow-up instructions, and discharged instructions with patient and/or family.  ___________________________________________   FINAL CLINICAL IMPRESSION(S) / ED DIAGNOSES   Final diagnoses:  Allergic reaction, subsequent encounter  Anaphylaxis, subsequent encounter        Lisa Roca, MD 02/11/15 1237

## 2015-02-11 NOTE — ED Notes (Addendum)
Possible allergic reaction  Swelling to lips and hands  With  hives  Has been seen for same twice  No resp distress noted

## 2015-02-11 NOTE — ED Notes (Signed)
Pt reports itching has returned. MD notified

## 2015-02-11 NOTE — Discharge Instructions (Signed)
You were evaluated for allergic reaction due to an unknown precipitant. You're being started on prednisone taper. Continue Zantac and Zyrtec at home. He may continue to take Benadryl 25 mg every 4 hours as needed for any hives or itching.  Return to the emergency department for any worsening condition including any trouble breathing, wheezing, swelling of throat tongue or lips.  Anaphylactic Reaction An anaphylactic reaction is a sudden, severe allergic reaction that involves the whole body. It can be life threatening. A hospital stay is often required. People with asthma, eczema, or hay fever are slightly more likely to have an anaphylactic reaction. CAUSES  An anaphylactic reaction may be caused by anything to which you are allergic. After being exposed to the allergic substance, your immune system becomes sensitized to it. When you are exposed to that allergic substance again, an allergic reaction can occur. Common causes of an anaphylactic reaction include:  Medicines.  Foods, especially peanuts, wheat, shellfish, milk, and eggs.  Insect bites or stings.  Blood products.  Chemicals, such as dyes, latex, and contrast material used for imaging tests. SYMPTOMS  When an allergic reaction occurs, the body releases histamine and other substances. These substances cause symptoms such as tightening of the airway. Symptoms often develop within seconds or minutes of exposure. Symptoms may include:  Skin rash or hives.  Itching.  Chest tightness.  Swelling of the eyes, tongue, or lips.  Trouble breathing or swallowing.  Lightheadedness or fainting.  Anxiety or confusion.  Stomach pains, vomiting, or diarrhea.  Nasal congestion.  A fast or irregular heartbeat (palpitations). DIAGNOSIS  Diagnosis is based on your history of recent exposure to allergic substances, your symptoms, and a physical exam. Your caregiver may also perform blood or urine tests to confirm the  diagnosis. TREATMENT  Epinephrine medicine is the main treatment for an anaphylactic reaction. Other medicines that may be used for treatment include antihistamines, steroids, and albuterol. In severe cases, fluids and medicine to support blood pressure may be given through an intravenous line (IV). Even if you improve after treatment, you need to be observed to make sure your condition does not get worse. This may require a stay in the hospital. [place] a medical alert bracelet or necklace stating your allergy.  You and your family must learn how to use an anaphylaxis kit or give an epinephrine injection to temporarily treat an emergency allergic reaction. Always carry your epinephrine injection or anaphylaxis kit with you. This can be lifesaving if you have a severe reaction.  Do not drive or perform tasks after treatment until the medicines used to treat your reaction have worn off, or until your caregiver says it is okay.  If you have hives or a rash:  Take medicines as directed by your caregiver.  You may use an over-the-counter antihistamine (diphenhydramine) as needed.  Apply cold compresses to the skin or take baths in cool water. Avoid hot baths or showers. SEEK MEDICAL CARE IF:   You develop symptoms of an allergic reaction to a new substance. Symptoms may start right away or minutes later.  You develop a rash, hives, or itching.  You develop new symptoms. SEEK IMMEDIATE MEDICAL CARE IF:   You have swelling of the mouth, difficulty breathing, or wheezing.  You have a tight feeling in your chest or throat.  You develop hives, swelling, or itching all over your body.  You develop severe vomiting or diarrhea.  You feel faint or pass out. This  is an emergency. Use your epinephrine injection or anaphylaxis kit as you have been instructed. Call your local emergency services (911 in [place] Even if you improve after the injection, you need to be examined at  a hospital emergency department. MAKE SURE YOU:   Understand these instructions.  Will watch your condition.  Will get help right away if you are not doing well or get worse.   This information is not intended to replace advice given to you by your health care provider. Make sure you discuss any questions you have with your health care provider.   Document Released: 03/10/2005 Document Revised: 03/15/2013 Document Reviewed: 09/20/2014 Elsevier Interactive Patient Education [employer].

## 2015-02-12 ENCOUNTER — Telehealth: Payer: Self-pay | Admitting: Neurology

## 2015-02-12 NOTE — Telephone Encounter (Signed)
Called patient to inform her that the pharmacy has been trying to reach her in order to gain consent for shipment of her medication. She did not answer so I left her a VM with the pharmacy's phone number asking her to call them.

## 2015-02-12 NOTE — Telephone Encounter (Signed)
Called patient and spoke with relayed she needed to Call [facility] and give consent . Patient will call me back after she calls [facility].

## 2015-02-14 ENCOUNTER — Telehealth: Payer: Self-pay | Admitting: *Deleted

## 2015-02-14 NOTE — Telephone Encounter (Signed)
I called the pharmacy.  Patient's currently formulary is not covering Lamotrigine ER.  Ins has been contacted and provided with clinical info.  Request is currently under review Ref # [id number] - [id number], it has been marked Urgent.   I called and spoke with the patient.  She expressed understanding and says she still has meds on hand, so she is okay at this time.

## 2015-02-21 ENCOUNTER — Ambulatory Visit (INDEPENDENT_AMBULATORY_CARE_PROVIDER_SITE_OTHER): Payer: 59 | Admitting: Neurology

## 2015-02-21 ENCOUNTER — Encounter: Payer: Self-pay | Admitting: Neurology

## 2015-02-21 VITALS — BP 122/83 | HR 75 | Ht 62.0 in | Wt 169.0 lb

## 2015-02-21 DIAGNOSIS — IMO0002 Reserved for concepts with insufficient information to code with codable children: Secondary | ICD-10-CM

## 2015-02-21 DIAGNOSIS — Q283 Other malformations of cerebral vessels: Secondary | ICD-10-CM | POA: Diagnosis not present

## 2015-02-21 DIAGNOSIS — R569 Unspecified convulsions: Secondary | ICD-10-CM | POA: Diagnosis not present

## 2015-02-21 DIAGNOSIS — G43709 Chronic migraine without aura, not intractable, without status migrainosus: Secondary | ICD-10-CM | POA: Diagnosis not present

## 2015-02-21 DIAGNOSIS — Q282 Arteriovenous malformation of cerebral vessels: Secondary | ICD-10-CM

## 2015-02-21 MED ORDER — LAMOTRIGINE 100 MG PO TABS: 200.0000 mg | ORAL_TABLET | Freq: Two times a day (BID) | ORAL | Status: DC

## 2015-02-21 MED ORDER — TOPIRAMATE 100 MG PO TABS: ORAL_TABLET | ORAL | Status: DC

## 2015-02-21 MED ORDER — LAMOTRIGINE 100 MG PO TABS: 200.0000 mg | ORAL_TABLET | Freq: Every day | ORAL | Status: DC

## 2015-02-21 NOTE — Progress Notes (Signed)
**  Botox Lot [id number], Exp 08/2017, [facility] [id number], specialty pharmacy**mck,rn
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
## 2015-03-07 ENCOUNTER — Telehealth: Payer: Self-pay | Admitting: Neurology

## 2015-03-07 NOTE — Telephone Encounter (Signed)
Pt called and states that she has not received her shipment of Vimpat yet , which was to arrive today via [employer]. She was advised that [employer] will run for some time still and to keep a look out. She expressed understanding and said she will call back if anything changes.

## 2015-04-06 ENCOUNTER — Telehealth: Payer: Self-pay | Admitting: Neurology

## 2015-04-06 NOTE — Telephone Encounter (Signed)
Katie [name] with [facility] calling needing auth for botox for refill to ship. Previous RX has expired. Please call at [phone] opt 1.

## 2015-04-09 NOTE — Telephone Encounter (Signed)
Noted. Will call closer to the patients apt and give new Rx.

## 2015-04-25 ENCOUNTER — Telehealth: Payer: Self-pay | Admitting: *Deleted

## 2015-04-25 NOTE — Telephone Encounter (Signed)
On 04-25-15 fax medical records to disability determination services it was consult note, sim and tx planning note, end of tx note, follow up note

## 2015-04-30 ENCOUNTER — Other Ambulatory Visit: Payer: Self-pay | Admitting: Obstetrics & Gynecology

## 2015-04-30 DIAGNOSIS — N6452 Nipple discharge: Secondary | ICD-10-CM

## 2015-04-30 DIAGNOSIS — N644 Mastodynia: Secondary | ICD-10-CM

## 2015-05-03 ENCOUNTER — Other Ambulatory Visit: Payer: Self-pay

## 2015-05-10 NOTE — Telephone Encounter (Signed)
Called patient to inform her the insurance verification for her upcoming botox apt came back not covered due to a inactive policy. Will need new insurance information to proceed.

## 2015-05-16 NOTE — Telephone Encounter (Signed)
Spoke with the patient who informed me her insurance had changed. Submitted her new policy. The patient stated that she has had quite a few seizures in the past few weeks and requested to speak with the nurse regarding his. Please call and advise.

## 2015-05-16 NOTE — Telephone Encounter (Signed)
Spoke to [place] - per Dr. [name], may be a candidate for VNS - an appt has been scheduled for her on 05/21/15.  She will continue her medications, as prescribed.

## 2015-05-17 ENCOUNTER — Other Ambulatory Visit: Payer: Self-pay

## 2015-05-21 ENCOUNTER — Ambulatory Visit: Payer: Self-pay | Admitting: Neurology

## 2015-05-21 ENCOUNTER — Telehealth: Payer: Self-pay | Admitting: *Deleted

## 2015-05-21 NOTE — Telephone Encounter (Signed)
No showed follow up appt.  Spoke to patient, after her scheduled time - stated she felt bad and forgot to call our office.

## 2015-05-22 ENCOUNTER — Encounter: Payer: Self-pay | Admitting: Neurology

## 2015-05-23 ENCOUNTER — Other Ambulatory Visit: Payer: Self-pay

## 2015-05-28 ENCOUNTER — Ambulatory Visit (INDEPENDENT_AMBULATORY_CARE_PROVIDER_SITE_OTHER): Payer: BLUE CROSS/BLUE SHIELD | Admitting: Neurology

## 2015-05-28 ENCOUNTER — Encounter: Payer: Self-pay | Admitting: Neurology

## 2015-05-28 VITALS — BP 126/96 | HR 97 | Ht 62.0 in | Wt 174.0 lb

## 2015-05-28 DIAGNOSIS — Q282 Arteriovenous malformation of cerebral vessels: Secondary | ICD-10-CM

## 2015-05-28 DIAGNOSIS — Q283 Other malformations of cerebral vessels: Secondary | ICD-10-CM | POA: Diagnosis not present

## 2015-05-28 DIAGNOSIS — IMO0002 Reserved for concepts with insufficient information to code with codable children: Secondary | ICD-10-CM

## 2015-05-28 DIAGNOSIS — G43709 Chronic migraine without aura, not intractable, without status migrainosus: Secondary | ICD-10-CM | POA: Diagnosis not present

## 2015-05-28 DIAGNOSIS — R569 Unspecified convulsions: Secondary | ICD-10-CM

## 2015-05-28 DIAGNOSIS — I671 Cerebral aneurysm, nonruptured: Secondary | ICD-10-CM

## 2015-05-28 MED ORDER — LAMOTRIGINE 100 MG PO TABS: ORAL_TABLET | ORAL | Status: DC
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
## 2015-05-29 ENCOUNTER — Ambulatory Visit (INDEPENDENT_AMBULATORY_CARE_PROVIDER_SITE_OTHER): Payer: BLUE CROSS/BLUE SHIELD | Admitting: Neurology

## 2015-05-29 ENCOUNTER — Other Ambulatory Visit (INDEPENDENT_AMBULATORY_CARE_PROVIDER_SITE_OTHER): Payer: Self-pay

## 2015-05-29 ENCOUNTER — Telehealth: Payer: Self-pay

## 2015-05-29 DIAGNOSIS — G43709 Chronic migraine without aura, not intractable, without status migrainosus: Secondary | ICD-10-CM

## 2015-05-29 DIAGNOSIS — Z0289 Encounter for other administrative examinations: Secondary | ICD-10-CM

## 2015-05-29 DIAGNOSIS — Q282 Arteriovenous malformation of cerebral vessels: Secondary | ICD-10-CM

## 2015-05-29 DIAGNOSIS — I671 Cerebral aneurysm, nonruptured: Secondary | ICD-10-CM

## 2015-05-29 DIAGNOSIS — R569 Unspecified convulsions: Secondary | ICD-10-CM

## 2015-05-29 DIAGNOSIS — IMO0002 Reserved for concepts with insufficient information to code with codable children: Secondary | ICD-10-CM

## 2015-05-29 NOTE — Telephone Encounter (Signed)
PT wants to go through with the VNS ... Please advise

## 2015-05-29 NOTE — Telephone Encounter (Signed)
I have called in as technical support Remo Lipps, he will get in touch with our office, she will be referred for neurosurgical evaluation by Dr.Nundkumar.

## 2015-05-29 NOTE — Telephone Encounter (Signed)
I have called and left message for her, there only a few neurosurgeons in town can perform vagal nerve stimulator surgery, we will talk with technical support Remo Lipps, get back to her with detail

## 2015-05-29 NOTE — Telephone Encounter (Signed)
She would ike to be referred to Dr. Ashok Pall at [facility] (she saw him for her AVM).

## 2015-05-31 LAB — COMPREHENSIVE METABOLIC PANEL
A/G RATIO: 2 (ref 1.1–2.5)
ALK PHOS: 65 IU/L (ref 39–117)
ALT: 13 IU/L (ref 0–32)
AST: 17 IU/L (ref 0–40)
Albumin: 4.6 g/dL (ref 3.5–5.5)
BUN/Creatinine Ratio: 23 — ABNORMAL HIGH (ref 8–20)
BUN: 19 mg/dL (ref 6–20)
CHLORIDE: 106 mmol/L (ref 96–106)
CO2: 20 mmol/L (ref 18–29)
Calcium: 9.1 mg/dL (ref 8.7–10.2)
Creatinine, Ser: 0.83 mg/dL (ref 0.57–1.00)
GFR calc Af Amer: 104 mL/min/{1.73_m2} (ref >59)
GFR calc non Af Amer: 90 mL/min/{1.73_m2} (ref >59)
GLUCOSE: 107 mg/dL — AB (ref 65–99)
Globulin, Total: 2.3 g/dL (ref 1.5–4.5)
POTASSIUM: 4.8 mmol/L (ref 3.5–5.2)
Sodium: 142 mmol/L (ref 134–144)
Total Protein: 6.9 g/dL (ref 6.0–8.5)

## 2015-05-31 LAB — CBC
Hematocrit: 42.7 % (ref 34.0–46.6)
Hemoglobin: 14.1 g/dL (ref 11.1–15.9)
MCH: 31.4 pg (ref 26.6–33.0)
MCHC: 33 g/dL (ref 31.5–35.7)
MCV: 95 fL (ref 79–97)
PLATELETS: 226 10*3/uL (ref 150–379)
RBC: 4.49 x10E6/uL (ref 3.77–5.28)
RDW: 13.2 % (ref 12.3–15.4)
WBC: 5.2 10*3/uL (ref 3.4–10.8)

## 2015-05-31 LAB — TOPIRAMATE LEVEL: TOPIRAMATE LVL: 7.5 ug/mL (ref 2.0–25.0)

## 2015-05-31 LAB — TSH: TSH: 1.45 u[IU]/mL (ref 0.450–4.500)

## 2015-05-31 LAB — LAMOTRIGINE LEVEL: LAMOTRIGINE LVL: 3.8 ug/mL (ref 2.0–20.0)

## 2015-05-31 NOTE — Procedures (Signed)
   HISTORY: 40 year old female with history of right AVM, status post radiation, presenting with recurrent seizure, depression, headaches,  TECHNIQUE:  16 channel EEG was performed based on standard 10-16 international system. One channel was dedicated to EKG, which has demonstrates normal sinus rhythm of 72 beats per minutes.  Upon awakening, the posterior background activity was well-developed, in alpha range, there was also frequent bilateral frontal predominant small amplitude beta range activity, reactive to eye opening and closure. There was frequent lateral frontal muscle artifact at the beginning of the tracing  There was intermittent T6, F 8 occasionally oh 2 sharp transient.  Photic stimulation was performed, which induced a symmetric photic driving.  Hyperventilation was performed, there was no abnormality elicit.  Stage II sleep was achieved.  CONCLUSION: This is a mild abnormal awake and asleep,  there is evidence of right parietal irritability suggest patient is prone to develop partial seizure.

## 2015-06-18 ENCOUNTER — Ambulatory Visit: Payer: BLUE CROSS/BLUE SHIELD | Admitting: Neurology

## 2015-06-18 ENCOUNTER — Telehealth: Payer: Self-pay | Admitting: *Deleted

## 2015-06-18 NOTE — Telephone Encounter (Signed)
No showed follow up appointment. 

## 2015-06-19 ENCOUNTER — Encounter: Payer: Self-pay | Admitting: Neurology

## 2015-06-20 ENCOUNTER — Telehealth: Payer: Self-pay | Admitting: Neurology

## 2015-06-20 NOTE — Telephone Encounter (Signed)
Left message for her to call our office to schedule appt.

## 2015-06-20 NOTE — Telephone Encounter (Signed)
[facility] [phone] called to get clarification on whether or not patient is continuing to take topiramate (TOPAMAX) 100 MG tablet? Was just refilled on 06/14/15,  Did receive prescription for lamoTRIgine (LAMICTAL) 100 MG tablet. Please advise.

## 2015-06-20 NOTE — Telephone Encounter (Signed)
Returned call to [place] and reviewed med list.

## 2015-07-02 ENCOUNTER — Telehealth: Payer: Self-pay | Admitting: Radiation Oncology

## 2015-07-02 ENCOUNTER — Ambulatory Visit
Admission: RE | Admit: 2015-07-02 | Payer: No Typology Code available for payment source | Source: Ambulatory Visit | Admitting: Radiation Oncology

## 2015-07-02 NOTE — Telephone Encounter (Signed)
Patient has not shown for 0930 appointment with Dr. Tammi Klippel. Phoned patient. No answer. Left message requesting return call.

## 2015-07-11 ENCOUNTER — Telehealth: Payer: Self-pay | Admitting: *Deleted

## 2015-07-11 NOTE — Telephone Encounter (Signed)
CALLED PATIENT TO ASK ABOUT RESCHEDULING MISSED FU ON 07-02-15, LVM FOR A RETURN CALL

## 2015-07-25 ENCOUNTER — Other Ambulatory Visit: Payer: Self-pay

## 2015-08-14 ENCOUNTER — Telehealth: Payer: Self-pay | Admitting: Neurology

## 2015-08-14 ENCOUNTER — Other Ambulatory Visit: Payer: Self-pay

## 2015-08-14 ENCOUNTER — Ambulatory Visit: Payer: Self-pay | Admitting: Adult Health

## 2015-08-14 NOTE — Telephone Encounter (Signed)
Spoke to [place] - reports having prolonged periods of teeth clinching, in addition to seizure activity last pm. She does admit to some confusion on her lamotrigine dosage and had been taking 300mg  in the am and 300mg  in the evening, until one week ago when she noticed the directions on her prescription to be different.  She reduced her dose down to the prescribed amount of 200mg  in the am and 300mg  in the evening.  She has been taking both Vimpat and Topamax as prescribed.  She was interested in having a VNS placed but after speaking with the company was unable to afford the copay.  She has been placed on Megan's schedule this week to discuss her anticonvulsant medications.  I reviewed our office no show policy with her due to no showing twice within a month's time.  She also has a balance with our office that she plans to set up a payment plan to resolve.

## 2015-08-14 NOTE — Telephone Encounter (Signed)
Patient is calling and stating that she had 6 seizures last night and when she got up today she passed out and woke up on the floor.  She states she  has been taking her medication.  I suggested she might want to go to the ER but the patient seemed unable to think?  Please call.

## 2015-08-15 ENCOUNTER — Encounter: Payer: Self-pay | Admitting: *Deleted

## 2015-08-15 ENCOUNTER — Encounter: Payer: Self-pay | Admitting: Adult Health

## 2015-08-15 ENCOUNTER — Telehealth: Payer: Self-pay | Admitting: *Deleted

## 2015-08-15 ENCOUNTER — Ambulatory Visit (INDEPENDENT_AMBULATORY_CARE_PROVIDER_SITE_OTHER): Payer: BLUE CROSS/BLUE SHIELD | Admitting: Adult Health

## 2015-08-15 VITALS — BP 123/88 | HR 88 | Ht 62.0 in | Wt 181.8 lb

## 2015-08-15 DIAGNOSIS — R569 Unspecified convulsions: Secondary | ICD-10-CM

## 2015-08-15 DIAGNOSIS — R51 Headache: Secondary | ICD-10-CM

## 2015-08-15 DIAGNOSIS — Z5181 Encounter for therapeutic drug level monitoring: Secondary | ICD-10-CM

## 2015-08-15 DIAGNOSIS — R109 Unspecified abdominal pain: Secondary | ICD-10-CM | POA: Diagnosis not present

## 2015-08-15 DIAGNOSIS — Q2730 Arteriovenous malformation, site unspecified: Secondary | ICD-10-CM | POA: Diagnosis not present

## 2015-08-15 DIAGNOSIS — R519 Headache, unspecified: Secondary | ICD-10-CM

## 2015-08-15 MED ORDER — LACOSAMIDE 200 MG PO TABS: 200.0000 mg | ORAL_TABLET | Freq: Two times a day (BID) | ORAL | Status: DC

## 2015-08-15 NOTE — Telephone Encounter (Signed)
Patient also inquired about driving while I was on the phone with her today.  I told her it was against medical advice and [place] law to drive until seizure free for at least six months.  I also talked with her about the importance of keeping her appointments and taking her medications, as prescribed.  She admitted to driving, at times, when her children needed to be picked up from school or get to an activity.  I again reviewed the reasons that driving is inappropriate.  This has also previously been discussed with her by Ward Givens, NP and Dr. Marcial Pacas. She verbalized understanding.

## 2015-08-15 NOTE — Telephone Encounter (Signed)
Spoke to [place] - she needs help with patient assistance for Vimpat.
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
## 2015-08-15 NOTE — Progress Notes (Signed)
I have reviewed and agreed above plan. 

## 2015-08-15 NOTE — Patient Instructions (Addendum)
Continue Topamax, lamictal and Vimpat Follow through with CT head at Dr. Lacy Duverney request Blood work today If your symptoms worsen or you develop new symptoms please let us know.  Consider VNS- I will have rep call you Of course if you have sudden changes or severe symptoms call 911 or go to emergency room

## 2015-08-16 ENCOUNTER — Encounter (HOSPITAL_COMMUNITY): Payer: Self-pay

## 2015-08-16 ENCOUNTER — Emergency Department (HOSPITAL_COMMUNITY)
Admission: EM | Admit: 2015-08-16 | Discharge: 2015-08-16 | Disposition: A | Discharge status: Home / Self Care | Payer: BLUE CROSS/BLUE SHIELD | Attending: Emergency Medicine | Admitting: Emergency Medicine

## 2015-08-16 ENCOUNTER — Telehealth: Payer: Self-pay | Admitting: *Deleted

## 2015-08-16 ENCOUNTER — Emergency Department (HOSPITAL_COMMUNITY): Payer: BLUE CROSS/BLUE SHIELD

## 2015-08-16 DIAGNOSIS — F329 Major depressive disorder, single episode, unspecified: Secondary | ICD-10-CM | POA: Insufficient documentation

## 2015-08-16 DIAGNOSIS — R51 Headache: Secondary | ICD-10-CM | POA: Diagnosis not present

## 2015-08-16 DIAGNOSIS — Z87891 Personal history of nicotine dependence: Secondary | ICD-10-CM | POA: Insufficient documentation

## 2015-08-16 DIAGNOSIS — I10 Essential (primary) hypertension: Secondary | ICD-10-CM | POA: Insufficient documentation

## 2015-08-16 DIAGNOSIS — G40109 Localization-related (focal) (partial) symptomatic epilepsy and epileptic syndromes with simple partial seizures, not intractable, without status epilepticus: Secondary | ICD-10-CM | POA: Diagnosis not present

## 2015-08-16 DIAGNOSIS — R569 Unspecified convulsions: Secondary | ICD-10-CM | POA: Diagnosis present

## 2015-08-16 DIAGNOSIS — G8929 Other chronic pain: Secondary | ICD-10-CM

## 2015-08-16 DIAGNOSIS — Z87798 Personal history of other (corrected) congenital malformations: Secondary | ICD-10-CM | POA: Diagnosis not present

## 2015-08-16 DIAGNOSIS — Z8679 Personal history of other diseases of the circulatory system: Secondary | ICD-10-CM

## 2015-08-16 DIAGNOSIS — Z79899 Other long term (current) drug therapy: Secondary | ICD-10-CM | POA: Diagnosis not present

## 2015-08-16 LAB — URINALYSIS
Bilirubin, UA: NEGATIVE
Glucose, UA: NEGATIVE
KETONES UA: NEGATIVE
LEUKOCYTES UA: NEGATIVE
NITRITE UA: NEGATIVE
PH UA: 6 (ref 5.0–7.5)
Protein, UA: NEGATIVE
RBC UA: NEGATIVE
Specific Gravity, UA: 1.021 (ref 1.005–1.030)
UUROB: 0.2 mg/dL (ref 0.2–1.0)

## 2015-08-16 MED ORDER — DIPHENHYDRAMINE HCL 50 MG/ML IJ SOLN
25.0000 mg | Freq: Once | INTRAMUSCULAR | Status: AC
  Administered 2015-08-16: 25 mg via INTRAVENOUS
  Filled 2015-08-16: qty 1

## 2015-08-16 MED ORDER — METOCLOPRAMIDE HCL 10 MG PO TABS: 10.0000 mg | ORAL_TABLET | Freq: Four times a day (QID) | ORAL | Status: DC | PRN

## 2015-08-16 MED ORDER — SODIUM CHLORIDE 0.9 % IV SOLN
1000.0000 mL | Freq: Once | INTRAVENOUS | Status: AC
  Administered 2015-08-16: 1000 mL via INTRAVENOUS

## 2015-08-16 MED ORDER — SODIUM CHLORIDE 0.9 % IV SOLN
1000.0000 mL | INTRAVENOUS | Status: DC
  Administered 2015-08-16 (×2): 1000 mL via INTRAVENOUS

## 2015-08-16 MED ORDER — METOCLOPRAMIDE HCL 5 MG/ML IJ SOLN
10.0000 mg | Freq: Once | INTRAMUSCULAR | Status: AC
  Administered 2015-08-16: 10 mg via INTRAVENOUS
  Filled 2015-08-16: qty 2

## 2015-08-16 MED ORDER — DIPHENHYDRAMINE HCL 25 MG PO TABS: 25.0000 mg | ORAL_TABLET | Freq: Four times a day (QID) | ORAL | Status: DC

## 2015-08-16 NOTE — ED Notes (Signed)
Pt from home and states she's been having intermittent seizure activity x last 3 days.  States she's had headaches x last week and a half, behind the eyes.  Also has been "hearing dripping her head."   Went to see neurologist yesterday and her Vimpat was increased from 150mg  bid to 200mg  bid.  Her neurosurgeon wanted her to have a head CT done but she states her headache was too bad to go and get it done.  VSS per EMS.  SL to [facility] placed by EMS.  States she can feel them come on and states she feels one coming on now while we speak.

## 2015-08-16 NOTE — Telephone Encounter (Signed)
Alfonso Ellis from VNS came by and spoke to [place], NP relating to pt.

## 2015-08-16 NOTE — Telephone Encounter (Signed)
I LMVM for Katie [name] to return call about this pt.  Assistance for VNS on this pt?

## 2015-08-16 NOTE — ED Provider Notes (Signed)
CSN: [id number]     Arrival date & time 08/16/15  [id number] History   First MD Initiated Contact with Patient 08/16/15 1006     Chief Complaint  Patient presents with  . Seizures     (Consider location/radiation/quality/duration/timing/severity/associated sxs/prior Treatment) HPI Patient has had a headache for the ongoing past week approximately. She reports as been constant. She states this before she is in acute liver seizures it gets sharp between her eyes. Her seizures are partial in nature and she basically goes to sleep for a little bit and doesn't respond and then wakes with a small start. She says she spent most of the past week in bed. No fevers or chills. She reports she has felt nauseated. She has not had new or different incoordination or focal weakness. Patient has had some slight residual left-sided weakness after her AVM repair. She had seen her neurologist yesterday and had her Vimpat increased from 150 mg twice a day to 200 mg twice a day. She states that to this point she has not noticed a change in the frequency of her seizures. Past Medical History  Diagnosis Date  . Fibromyalgia   . Migraine   . Hypertension   . Depression   . Anxiety   . AVM (arteriovenous malformation)   . Cerebral aneurysm   . Seizures ([place])   . Liver hemangioma    Past Surgical History  Procedure Laterality Date  . Tubal ligation    . Cerebral embolization  dec 2013  . Wisdom tooth extraction  2001  . Radiation brain    . Dilitation & currettage/hystroscopy with novasure ablation N/A 08/04/2013    Procedure: DILATATION & CURETTAGE/HYSTEROSCOPY WITH NOVASURE ABLATION;  Surgeon: Annalee Genta, DO;  Location: [facility];  Service: Gynecology;  Laterality: N/A;  . Cerebral angiogram     Family History  Problem Relation Age of Onset  . Bipolar disorder Mother   . Thyroid disease Maternal Grandmother   . Glaucoma Maternal Grandmother   . Aneurysm Father    Social History  Substance Use Topics  .  Smoking status: Former Research scientist (life sciences)  . Smokeless tobacco: Never Used     Comment: Quit 2007  . Alcohol Use: 0.6 oz/week    1 Cans of beer per week     Comment: occasional   OB History    Gravida Para Term Preterm AB TAB SAB Ectopic Multiple Living   3 2 2  1 1    2      Review of Systems  10 Systems reviewed and are negative for acute change except as noted in the HPI.   Allergies  Review of patient's allergies indicates no known allergies.  Home Medications   Prior to Admission medications   Medication Sig Start Date End Date Taking? Authorizing Provider  butalbital-acetaminophen-caffeine (FIORICET) 50-325-40 MG tablet Take 1-2 tablets by mouth every 6 (six) hours as needed for headache. 01/25/15 01/25/16 Yes Marcial Pacas, MD  clonazePAM (KLONOPIN) 1 MG tablet Take 1 mg by mouth daily as needed for anxiety.  11/21/14  Yes Historical Provider, MD  HYDROcodone-acetaminophen (NORCO) 7.5-325 MG per tablet Take 1 tablet by mouth every 6 (six) hours as needed for moderate pain.  02/21/14  Yes Historical Provider, MD  lacosamide (VIMPAT) 200 MG TABS tablet Take 1 tablet (200 mg total) by mouth 2 (two) times daily. 08/15/15  Yes Ward Givens, NP  lamoTRIgine (LAMICTAL) 100 MG tablet 2 tabs in the morning and 3 tabs at night. 05/28/15  Yes  Marcial Pacas, MD  methocarbamol (ROBAXIN) 750 MG tablet TWO TABLETS BY MOUTH ONCE DAILY AS NEEDED FOR MUSCLE SPASMS 11/21/14  Yes Historical Provider, MD  metroNIDAZOLE (METROGEL) 0.75 % vaginal gel APPLY DAILY AS NEEDED 11/21/14  Yes Historical Provider, MD  montelukast (SINGULAIR) 10 MG tablet Take 10 mg by mouth at bedtime.   Yes Historical Provider, MD  ondansetron (ZOFRAN) 4 MG tablet Take 1 tablet (4 mg total) by mouth every 8 (eight) hours as needed for nausea or vomiting. 06/21/14  Yes Ward Givens, NP  PARoxetine (PAXIL) 40 MG tablet ONE TABLET BY MOUTH DAILY 12/18/14  Yes Historical Provider, MD  ranitidine (ZANTAC) 150 MG tablet Take 1 tablet (150 mg total) by  mouth 2 (two) times daily. 02/03/15 02/03/16 Yes Johnn Hai, PA-C  topiramate (TOPAMAX) 100 MG tablet One at am, two at night Patient taking differently: Take 100 mg by mouth 2 (two) times daily. 100 mg in AM and 200 mg in PM 02/21/15  Yes Marcial Pacas, MD  vitamin B-12 (CYANOCOBALAMIN) 100 MCG tablet Take 100 mcg by mouth daily.   Yes Historical Provider, MD  vitamin C (ASCORBIC ACID) 500 MG tablet Take 500 mg by mouth daily.   Yes Historical Provider, MD  Botulinum Toxin Type A 200 UNITS SOLR Botox injections every three months. 02/01/15   Marcial Pacas, MD  clotrimazole-betamethasone (LOTRISONE) cream Apply to affected area 2 times daily Patient not taking: Reported on 08/16/2015 02/03/15 02/03/16  Johnn Hai, PA-C  diphenhydrAMINE (BENADRYL) 25 MG tablet Take 1 tablet (25 mg total) by mouth every 6 (six) hours. 08/16/15   Charlesetta Shanks, MD  EPINEPHrine (EPIPEN 2-PAK) 0.3 mg/0.3 mL IJ SOAJ injection Inject 0.3 mLs (0.3 mg total) into the muscle once. 02/11/15   Lisa Roca, MD  metoCLOPramide (REGLAN) 10 MG tablet Take 1 tablet (10 mg total) by mouth every 6 (six) hours as needed for nausea (nausea/headache). Take with Benadryl 25 mg. 08/16/15   Charlesetta Shanks, MD   BP 107/71 mmHg  Pulse 76  Temp(Src) 98.8 F (37.1 C) (Oral)  Resp 14  Ht 5\' 1"  (1.549 m)  Wt 180 lb (81.647 kg)  BMI 34.03 kg/m2  SpO2 100% Physical Exam  Constitutional: She is oriented to person, place, and time. She appears well-developed and well-nourished.  Patient appears uncomfortable when she is alert and nontoxic. No respiratory distress.  HENT:  Head: Normocephalic and atraumatic.  Right Ear: External ear normal.  Left Ear: External ear normal.  Nose: Nose normal.  Mouth/Throat: Oropharynx is clear and moist.  Eyes: EOM are normal. Pupils are equal, round, and reactive to light. No scleral icterus.  Neck: Neck supple.  Cardiovascular: Normal rate, regular rhythm, normal heart sounds and intact distal  pulses.   Pulmonary/Chest: Effort normal and breath sounds normal.  Abdominal: Soft. Bowel sounds are normal. She exhibits no distension. There is no tenderness.  Musculoskeletal: Normal range of motion. She exhibits no edema or tenderness.  Neurological: She is alert and oriented to person, place, and time. She has normal strength. No cranial nerve deficit. Coordination normal. GCS eye subscore is 4. GCS verbal subscore is 5. GCS motor subscore is 6.  Patient has very mildly difference in grip strength on the left being approximately 4\5. Left lower extremity as well patient is slightly diminished plantar flexion and extension 4\5 as compared to right. Patient's cognitive function and speech is normal.  Skin: Skin is warm, dry and intact.  Psychiatric: She has a normal mood and  affect.    ED Course  Procedures (including critical care time) Labs Review Labs Reviewed - No data to display  Imaging Review Ct Head Wo Contrast  08/16/2015  CLINICAL DATA:  Headache, multiple small seizures, worsening headaches, history brain AVM, hypertension, former smoker, fibromyalgia EXAM: CT HEAD WITHOUT CONTRAST TECHNIQUE: Contiguous axial images were obtained from the base of the skull through the vertex without intravenous contrast. COMPARISON:  03/08/2014 FINDINGS: Normal ventricular morphology. No midline shift or mass effect. Beam hardening artifacts from embolic material at the posterior RIGHT parietal region from prior AVM embolization, with overall stable appearance of site. Question associated white matter hypoattenuation versus artifacts. Within limitations of above artifacts, no definite intracranial hemorrhage, mass lesion, or evidence acute infarction. No extra-axial fluid collections. Small dural calcification at RIGHT tentorium stable. Sinuses clear and bones unremarkable. IMPRESSION: Stable changes post embolization of a RIGHT parietal AVM. No acute intracranial abnormalities. Electronically Signed    By: Lavonia Dana M.D.   On: 08/16/2015 13:01   I have personally reviewed and evaluated these images and lab results as part of my medical decision-making.   EKG Interpretation None     Consult: Patient's case reviewed with Dr.Yan of neurology. She had seen the patient yesterday. She advises the patient's Lamotrigne can be increased to 300mg  po BID. Consult: Dr. Christella Noa, advises get CT head, if negative, no further studies needed. Can follow up with neurology. 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 CT head does not show any acute changes. Patient is neurologically intact. She reports a baseline slight weakness on her left from her prior AVM repair. She is nontoxic without fever or signs of infectious illness. Patient did experience improvement with migraine therapy of Benadryl, Reglan and fluids. At this time, per recommendation of Dr. [name], will have the patient increase her Vimpat dosing for partial seizure. I will also prescribed Benadryl and Reglan to try for headache management. She is counseled to contact Dr. Nadara Mustard office to schedule her next follow-up.    Charlesetta Shanks, MD 08/16/15 1335

## 2015-08-16 NOTE — Discharge Instructions (Signed)
Brain Arteriovenous Malformation At this time, your CT Head does not show bleeding. Dr. Lyman Speller has recommended increasing your lamotrigine to 300 mg in the morning and evening. You can also take Benadryl and Reglan as prescribed for headaches. Call Dr. [name] and schedule your recheck next week to assess your response to headache treatment. A brain arteriovenous malformation (AVM) is a condition that means your arteries and veins are tangled. Your veins bring blood to your heart and lungs. Your arteries bring blood away from your heart and to your brain. If they are tangled, your blood is not able to travel to where it is needed. Brain AVM may also lead to bleeding in your brain (hemorrhage), which can be life-threatening. CAUSES It is not known what causes brain AVM. Most brain AVMs are present since birth (congenital). RISK FACTORS Risk factors for brain AVM include being female. SIGNS AND SYMPTOMS Symptoms of brain AVM may include:  Seizures.  Headache.  Inability to move (paralysis).  Loss of or difficulty with:  Speech.  Memory.  Vision.  Coordination.  Muscle weakness.  Feeling confused.  Experiencing things that are not real (hallucinating). It is also possible that you may not have any symptoms. DIAGNOSIS Diagnosis may include:  Medical history and physical exam.  MRI or magnetic resonance angiogram (MRA).  CT scan.  Cerebral angiogram.  Electroencephalogram (EEG). TREATMENT Treatment will depend on the size, location, and severity of the brain AVM. Treatment may include:  Surgery to remove the AVM.  Embolization. This involves closing off the vessels of the AVM by injecting glue into them.  Radiosurgery. This involves focusing radiation on the AVM.  Medicines to control your symptoms. HOME CARE INSTRUCTIONS  Take medicines only as directed by your health care provider.  Do not take blood thinners, such as aspirin or warfarin, unless instructed by your health  care provider.  Keep all follow-up visits as directed by your health care provider. This is important.  Avoid lifting heavy objects as directed by your health care provider. SEEK IMMEDIATE MEDICAL CARE IF:  You have a sudden, severe headache with no known cause.  You have nausea or vomiting occurring with another symptom.  You have sudden weakness or numbness of your face, arm, or leg, especially on one side of your body.  You have sudden trouble walking or difficulty moving your arms or legs.  You have sudden confusion.  You have sudden trouble speaking (aphasia) or understanding.  You have sudden trouble seeing in one or both of your eyes.  You have a sudden loss of balance or coordination.  You have a stiff neck.  You have difficulty breathing.  You have a partial or total loss of consciousness. These symptoms may represent a serious problem that is an emergency. Do not wait to see if the symptoms will go away. Get medical help right away. Call your local emergency services (911 in the [place] Do not drive yourself to the hospital.   This information is not intended to replace advice given to you by your health care provider. Make sure you discuss any questions you have with your health care provider.   Document Released: 02/28/2002 Document Revised: 03/31/2014 Document Reviewed: 10/13/2013 Elsevier Interactive Patient Education [employer].

## 2015-08-16 NOTE — Telephone Encounter (Signed)
Called patient and left a message with her son . Patient's  Is in the hospital he states he will have his mother call me when she returns . I am calling patient because I need proof of income so I can get process started to get help with her Vimpat .

## 2015-08-16 NOTE — ED Notes (Signed)
Bed: WA11 Expected date:  Expected time:  Means of arrival:  Comments: [facility] 

## 2015-08-16 NOTE — Telephone Encounter (Signed)
I have talked with emergency room doctor Clovis Fredrickson, patient was seen yesterday, presented to emergency room today, complains of multiple "small" seizures, she is on polypharmacy treatment, including lamotrigine 100 mg 2/3, Vimpat 200 mg twice a day, Topamax, 100/200 mg,  Patient had no shoulder appointment few times recently, now presented to the emergency room for worsening headaches, Last MRI of the brain scan was December 2015, she is also followed up by neurosurgeon Dr. Cyndy Freeze  I have suggested, if she continue has multiple partial seizures, may consider increase lamotrigine to 100 mg 3/3, Follow-up appointment in September 18 2015, call clinic for worsening symptoms.

## 2015-08-17 ENCOUNTER — Other Ambulatory Visit: Payer: Self-pay | Admitting: Obstetrics & Gynecology

## 2015-08-17 ENCOUNTER — Ambulatory Visit
Admission: RE | Admit: 2015-08-17 | Discharge: 2015-08-17 | Disposition: A | Discharge status: Home / Self Care | Payer: BLUE CROSS/BLUE SHIELD | Source: Ambulatory Visit | Attending: Obstetrics & Gynecology | Admitting: Obstetrics & Gynecology

## 2015-08-17 ENCOUNTER — Telehealth: Payer: Self-pay | Admitting: Adult Health

## 2015-08-17 DIAGNOSIS — N644 Mastodynia: Secondary | ICD-10-CM

## 2015-08-17 DIAGNOSIS — N6452 Nipple discharge: Secondary | ICD-10-CM

## 2015-08-17 LAB — CBC WITH DIFFERENTIAL/PLATELET
BASOS: 1 %
Basophils Absolute: 0 10*3/uL (ref 0.0–0.2)
EOS (ABSOLUTE): 0.1 10*3/uL (ref 0.0–0.4)
EOS: 1 %
HEMOGLOBIN: 14.1 g/dL (ref 11.1–15.9)
Hematocrit: 42.3 % (ref 34.0–46.6)
IMMATURE GRANS (ABS): 0 10*3/uL (ref 0.0–0.1)
IMMATURE GRANULOCYTES: 0 %
LYMPHS: 31 %
Lymphocytes Absolute: 1.9 10*3/uL (ref 0.7–3.1)
MCH: 32 pg (ref 26.6–33.0)
MCHC: 33.3 g/dL (ref 31.5–35.7)
MCV: 96 fL (ref 79–97)
MONOCYTES: 7 %
Monocytes Absolute: 0.4 10*3/uL (ref 0.1–0.9)
NEUTROS ABS: 3.7 10*3/uL (ref 1.4–7.0)
NEUTROS PCT: 60 %
Platelets: 213 10*3/uL (ref 150–379)
RBC: 4.41 x10E6/uL (ref 3.77–5.28)
RDW: 13.9 % (ref 12.3–15.4)
WBC: 6.1 10*3/uL (ref 3.4–10.8)

## 2015-08-17 LAB — COMPREHENSIVE METABOLIC PANEL
A/G RATIO: 1.6 (ref 1.2–2.2)
ALT: 21 IU/L (ref 0–32)
AST: 24 IU/L (ref 0–40)
Albumin: 4.4 g/dL (ref 3.5–5.5)
Alkaline Phosphatase: 64 IU/L (ref 39–117)
BUN/Creatinine Ratio: 13 (ref 9–23)
BUN: 10 mg/dL (ref 6–20)
Bilirubin Total: <0.2 mg/dL (ref 0.0–1.2)
CALCIUM: 9.1 mg/dL (ref 8.7–10.2)
CO2: 20 mmol/L (ref 18–29)
CREATININE: 0.75 mg/dL (ref 0.57–1.00)
Chloride: 104 mmol/L (ref 96–106)
GFR, EST AFRICAN AMERICAN: 118 mL/min/{1.73_m2} (ref >59)
GFR, EST NON AFRICAN AMERICAN: 102 mL/min/{1.73_m2} (ref >59)
GLUCOSE: 96 mg/dL (ref 65–99)
Globulin, Total: 2.7 g/dL (ref 1.5–4.5)
Potassium: 4.2 mmol/L (ref 3.5–5.2)
Sodium: 142 mmol/L (ref 134–144)
TOTAL PROTEIN: 7.1 g/dL (ref 6.0–8.5)

## 2015-08-17 LAB — TOPIRAMATE LEVEL: TOPIRAMATE LVL: 11.6 ug/mL (ref 2.0–25.0)

## 2015-08-17 LAB — LAMOTRIGINE LEVEL: Lamotrigine Lvl: 4.1 ug/mL (ref 2.0–20.0)

## 2015-08-17 MED ORDER — LAMOTRIGINE 100 MG PO TABS: 300.0000 mg | ORAL_TABLET | Freq: Two times a day (BID) | ORAL | Status: DC

## 2015-08-17 NOTE — Telephone Encounter (Signed)
I called the patient. Her labwork is normal. She was in the ED on 5/25 for seizures. Dr. [name] was consulted and instructed that her Lamictal could be increased to 300 mg BID. The patient reports that she does not have a prescription for this. I will call a new prescription. Her Vimpat was increased at the office visit to 200 mg BID. Patient has an appointment with Dr. [name] in June. However she states that she was instructed by the ED to f/u in 1 week. I advised that I would forward this to Dr. [name] and her nurse.

## 2015-08-17 NOTE — Telephone Encounter (Signed)
Katie [name]/[facility] [phone] called reg lamoTRIgine (LAMICTAL) 100 MG tablet . She is questioning the directions. Please call

## 2015-08-17 NOTE — Telephone Encounter (Signed)
I called Katie [name]- clarified instructions.

## 2015-08-21 NOTE — Telephone Encounter (Signed)
She had medication changes on 08/15/15 and 08/17/15.  She will take the new recommended doses and keep her appt 09/18/15.  Instructed her to call our office to report any further seizure activity.

## 2015-08-23 NOTE — Telephone Encounter (Signed)
Called and left another message for patient asking her to call me back.

## 2015-08-27 NOTE — Telephone Encounter (Signed)
Called and left patient another message to call me back.

## 2015-09-03 NOTE — Telephone Encounter (Signed)
Spoke to patient and she will be bringing proof of income.

## 2015-09-05 ENCOUNTER — Telehealth: Payer: Self-pay | Admitting: *Deleted

## 2015-09-05 NOTE — Telephone Encounter (Signed)
Pt dmv form on [employer].

## 2015-09-10 NOTE — Telephone Encounter (Signed)
To MM/NP for review, completion and signature.

## 2015-09-11 NOTE — Telephone Encounter (Signed)
Completed. Returned to [employer].

## 2015-09-11 NOTE — Telephone Encounter (Signed)
DMV form with attached med list sent to [facility] for faxing.

## 2015-09-12 ENCOUNTER — Telehealth: Payer: Self-pay | Admitting: *Deleted

## 2015-09-12 NOTE — Telephone Encounter (Signed)
Pt DMV form faxed on 09/12/2015. [phone]

## 2015-09-13 NOTE — Telephone Encounter (Signed)
Patient still has not brought me proof of income  For patient assistance.

## 2015-09-17 ENCOUNTER — Encounter: Payer: Self-pay | Admitting: Radiation Oncology

## 2015-09-17 ENCOUNTER — Ambulatory Visit
Admission: RE | Admit: 2015-09-17 | Discharge: 2015-09-17 | Disposition: A | Discharge status: Home / Self Care | Payer: BLUE CROSS/BLUE SHIELD | Source: Ambulatory Visit | Attending: Radiation Oncology | Admitting: Radiation Oncology

## 2015-09-17 VITALS — BP 109/74 | HR 75 | Resp 16 | Wt 183.0 lb

## 2015-09-17 DIAGNOSIS — Q282 Arteriovenous malformation of cerebral vessels: Secondary | ICD-10-CM

## 2015-09-17 NOTE — Progress Notes (Signed)
Radiation Oncology         [phone] ________________________________  Name: Katie [name]  MRN: [id number]  Date: 09/17/2015  DOB: 1976-03-02     Follow-Up Visit Note   CC: Jonathon Bellows, MD Ashok Pall, MD   Diagnosis: 40 y.o. female s/p SRS for a 29 mm right parietal subcortical Arteriovenous Malformation  Interval Since Last Radiation: 2 years and 5 months  04/14/2013: Right frontal AVM target was treated using 5 Dynamic Conformal Arcs to a prescription dose of 18 Gy. ExacTrac registration was performed for each couch angle. The 85.5% isodose line was prescribed.  Narrative:  The patient returns today for routine follow-up. CT of the head w/o contrast on 08/16/15 shows stable changes post embolization of a right parietal AVM and no acute intracranial abnormalities.  ROS: Denies pain. Reports frequent "almost daily seizure" activity. Describes that "twitching" occurs with seizures then she passes out. She loses consciousness, but not bladder or bowel function. Reports an occasional metallic taste accompanied with the seizure activity. She also reports she clinches her teeth during the seizure activity. Reports one occasion where she had whole body tremors and facial numbness. Reports daily headaches, persistent nausea, intermittent diplopia, ringing in the ears continues, occasions where she hears "dripping and gun shots," weakness on her whole left side continues, forgetfulness, stuttering, repeating words, and persistent fatigue. She also reports recent neck surgery. She got approved for disability and her driver's license is in the process of being revoked.  ALLERGIES:  has No Known Allergies.  Meds: Current Outpatient Prescriptions  Medication Sig Dispense Refill  . clonazePAM (KLONOPIN) 1 MG tablet Take 1 mg by mouth daily as needed for anxiety.   3  . clotrimazole-betamethasone (LOTRISONE) cream Apply to affected area 2 times daily 15 g 1  . diphenhydrAMINE (BENADRYL) 25  MG tablet Take 1 tablet (25 mg total) by mouth every 6 (six) hours. 30 tablet 0  . EPINEPHrine (EPIPEN 2-PAK) 0.3 mg/0.3 mL IJ SOAJ injection Inject 0.3 mLs (0.3 mg total) into the muscle once. 1 Device 2  . HYDROcodone-acetaminophen (NORCO) 7.5-325 MG per tablet Take 1 tablet by mouth every 6 (six) hours as needed for moderate pain.   0  . lacosamide (VIMPAT) 200 MG TABS tablet Take 1 tablet (200 mg total) by mouth 2 (two) times daily. 60 tablet 5  . lamoTRIgine (LAMICTAL) 100 MG tablet Take 3 tablets (300 mg total) by mouth 2 (two) times daily. 180 tablet 5  . methocarbamol (ROBAXIN) 750 MG tablet TWO TABLETS BY MOUTH ONCE DAILY AS NEEDED FOR MUSCLE SPASMS  3  . metoCLOPramide (REGLAN) 10 MG tablet Take 1 tablet (10 mg total) by mouth every 6 (six) hours as needed for nausea (nausea/headache). Take with Benadryl 25 mg. 30 tablet 0  . metroNIDAZOLE (METROGEL) 0.75 % vaginal gel APPLY DAILY AS NEEDED  9  . montelukast (SINGULAIR) 10 MG tablet Take 10 mg by mouth at bedtime.    . ondansetron (ZOFRAN) 4 MG tablet Take 1 tablet (4 mg total) by mouth every 8 (eight) hours as needed for nausea or vomiting. 30 tablet 5  . PARoxetine (PAXIL) 40 MG tablet ONE TABLET BY MOUTH DAILY  3  . topiramate (TOPAMAX) 100 MG tablet One at am, two at night (Patient taking differently: Take 100 mg by mouth 2 (two) times daily. 100 mg in AM and 200 mg in PM) 90 tablet 11  . vitamin B-12 (CYANOCOBALAMIN) 100 MCG tablet Take 100 mcg by mouth daily.    Marland Kitchen  vitamin C (ASCORBIC ACID) 500 MG tablet Take 500 mg by mouth daily.    . Botulinum Toxin Type A 200 UNITS SOLR Botox injections every three months. (Patient not taking: Reported on 09/17/2015) 1 each 3  . butalbital-acetaminophen-caffeine (FIORICET) 50-325-40 MG tablet Take 1-2 tablets by mouth every 6 (six) hours as needed for headache. (Patient not taking: Reported on 09/17/2015) 20 tablet 5  . ranitidine (ZANTAC) 150 MG tablet Take 1 tablet (150 mg total) by mouth 2 (two)  times daily. (Patient not taking: Reported on 09/17/2015) 30 tablet 1  . [DISCONTINUED] esomeprazole (NEXIUM) 40 MG capsule Take 40 mg by mouth daily before breakfast. Dispense as written     No current facility-administered medications for this encounter.    Physical Findings: The patient is in no acute distress. Patient is alert and oriented.  weight is 183 lb (83.008 kg). Her blood pressure is 109/74 and her pulse is 75. Her respiration is 16 and oxygen saturation is 100%. .  No significant changes.  Neurological: Mild left sided weakness. Otherwise neurologically intact.  Lab Findings: Lab Results  Component Value Date   WBC 6.1 08/15/2015   HGB 13.5 01/16/2015   HCT 42.3 08/15/2015   MCV 96 08/15/2015   PLT 213 08/15/2015    Radiographic Findings: No results found.  Impression:  No evidence of intracranial bleed. No significant neurological changes noted.  Plan:  Follow up in 6 months. She is scheduled to see Dr. Evelena Leyden tomorrow and she does not have an appointment with [facility]. Potentially repeat angiography sometime between now and the next follow up.  _____________________________________  Sheral Apley. Tammi Klippel, M.D.  This document serves as a record of services personally performed by Tyler Pita, MD. It was created on his behalf by Darcus Austin, a trained medical scribe. The creation of this record is based on the scribe's personal observations and the provider's statements to them. This document has been checked and approved by the attending provider.

## 2015-09-17 NOTE — Progress Notes (Signed)
Weight and vitals stable. Denies pain. Reports frequent "almost daily seizure" activity. Describes that "twitching" occurs with seizures then she passes out. Reports occasionally a metallic taste accompanied seizure activity. Also, reports she clinches her teeth during seizure activity. Reports one occasion where she had whole body tremors and facial numbness. Reports daily headaches. Reports persistent nausea. Reports intermittent diplopia. Reports ringing in the ears continues. Reports occasions where she hears "dripping and gun shots." Reports weakness on her left side continues. Reports persistent fatigue. Scheduled to see Dr. Evelena Leyden tomorrow. Doesn't have an appointment with [facility].   BP 109/74 mmHg  Pulse 75  Resp 16  Wt 183 lb (83.008 kg)  SpO2 100% Wt Readings from Last 3 Encounters:  09/17/15 183 lb (83.008 kg)  08/16/15 180 lb (81.647 kg)  08/15/15 181 lb 12.8 oz (82.464 kg)

## 2015-09-18 ENCOUNTER — Ambulatory Visit: Payer: BLUE CROSS/BLUE SHIELD | Admitting: Neurology

## 2015-09-18 ENCOUNTER — Telehealth: Payer: Self-pay | Admitting: Neurology

## 2015-09-18 NOTE — Telephone Encounter (Signed)
Patient called 3:01:10pm to advise, she won't be able to come to 4:30pm today, she just received call from husband's boss that husband got hurt on the job and they are taking him to ER now, wife is on her way to ER. Doesn't want this to count against her since she was told, if she misses or cancels appointment < 24 hours one more time, we will not schedule her again.

## 2015-10-17 ENCOUNTER — Other Ambulatory Visit (HOSPITAL_COMMUNITY): Payer: Self-pay | Admitting: Neurosurgery

## 2015-10-17 ENCOUNTER — Other Ambulatory Visit: Payer: Self-pay | Admitting: Neurosurgery

## 2015-10-17 DIAGNOSIS — Q282 Arteriovenous malformation of cerebral vessels: Secondary | ICD-10-CM

## 2015-10-23 ENCOUNTER — Ambulatory Visit (HOSPITAL_COMMUNITY)
Admission: RE | Admit: 2015-10-23 | Discharge: 2015-10-23 | Disposition: A | Discharge status: Home / Self Care | Payer: BLUE CROSS/BLUE SHIELD | Source: Ambulatory Visit | Attending: Neurosurgery | Admitting: Neurosurgery

## 2015-10-23 ENCOUNTER — Encounter (HOSPITAL_COMMUNITY): Payer: Self-pay

## 2015-10-23 DIAGNOSIS — Q282 Arteriovenous malformation of cerebral vessels: Secondary | ICD-10-CM

## 2015-10-23 DIAGNOSIS — Z539 Procedure and treatment not carried out, unspecified reason: Secondary | ICD-10-CM | POA: Insufficient documentation

## 2015-10-23 LAB — BASIC METABOLIC PANEL
ANION GAP: 6 (ref 5–15)
BUN: 12 mg/dL (ref 6–20)
CO2: 21 mmol/L — AB (ref 22–32)
Calcium: 9 mg/dL (ref 8.9–10.3)
Chloride: 112 mmol/L — ABNORMAL HIGH (ref 101–111)
Creatinine, Ser: 0.86 mg/dL (ref 0.44–1.00)
GFR calc Af Amer: >60 mL/min (ref >60)
GFR calc non Af Amer: >60 mL/min (ref >60)
GLUCOSE: 93 mg/dL (ref 65–99)
POTASSIUM: 4.2 mmol/L (ref 3.5–5.1)
Sodium: 139 mmol/L (ref 135–145)

## 2015-10-23 LAB — CBC WITH DIFFERENTIAL/PLATELET
Basophils Absolute: 0 10*3/uL (ref 0.0–0.1)
Basophils Relative: 0 %
Eosinophils Absolute: 0.2 10*3/uL (ref 0.0–0.7)
Eosinophils Relative: 3 %
HEMATOCRIT: 42 % (ref 36.0–46.0)
Hemoglobin: 13.7 g/dL (ref 12.0–15.0)
LYMPHS ABS: 1.9 10*3/uL (ref 0.7–4.0)
LYMPHS PCT: 33 %
MCH: 32.2 pg (ref 26.0–34.0)
MCHC: 32.6 g/dL (ref 30.0–36.0)
MCV: 98.8 fL (ref 78.0–100.0)
MONO ABS: 0.5 10*3/uL (ref 0.1–1.0)
MONOS PCT: 8 %
NEUTROS ABS: 3.3 10*3/uL (ref 1.7–7.7)
Neutrophils Relative %: 56 %
Platelets: 198 10*3/uL (ref 150–400)
RBC: 4.25 MIL/uL (ref 3.87–5.11)
RDW: 13.1 % (ref 11.5–15.5)
WBC: 5.9 10*3/uL (ref 4.0–10.5)

## 2015-10-23 LAB — URINE MICROSCOPIC-ADD ON

## 2015-10-23 LAB — URINALYSIS, ROUTINE W REFLEX MICROSCOPIC
Bilirubin Urine: NEGATIVE
GLUCOSE, UA: NEGATIVE mg/dL
HGB URINE DIPSTICK: NEGATIVE
Ketones, ur: NEGATIVE mg/dL
Nitrite: NEGATIVE
Protein, ur: NEGATIVE mg/dL
SPECIFIC GRAVITY, URINE: 1.017 (ref 1.005–1.030)
pH: 6 (ref 5.0–8.0)

## 2015-10-23 LAB — PROTIME-INR
INR: 1.11
Prothrombin Time: 14.4 seconds (ref 11.4–15.2)

## 2015-10-23 LAB — APTT: aPTT: 36 seconds (ref 24–36)

## 2015-10-23 MED ORDER — CHLORHEXIDINE GLUCONATE CLOTH 2 % EX PADS: 6.0000 | MEDICATED_PAD | Freq: Once | CUTANEOUS | Status: DC

## 2015-10-23 NOTE — Progress Notes (Signed)
Pt states " I break out in hives when it is really hot or cold.  I use benadryl 50mg  to help"

## 2015-10-23 NOTE — Discharge Instructions (Signed)
Moderate Conscious Sedation, Adult °Sedation is the use of medicines to promote relaxation and relieve discomfort and anxiety. Moderate conscious sedation is a type of sedation. Under moderate conscious sedation you are less alert than normal but are still able to respond to instructions or stimulation. Moderate conscious sedation is used during short medical and dental procedures. It is milder than deep sedation or general anesthesia and allows you to return to your regular activities sooner. °LET YOUR HEALTH CARE PROVIDER KNOW ABOUT:  °· Any allergies you have. °· All medicines you are taking, including vitamins, herbs, eye drops, creams, and over-the-counter medicines. °· Use of steroids (by mouth or creams). °· Previous problems you or members of your family have had with the use of anesthetics. °· Any blood disorders you have. °· Previous surgeries you have had. °· Medical conditions you have. °· Possibility of pregnancy, if this applies. °· Use of cigarettes, alcohol, or illegal drugs. °RISKS AND COMPLICATIONS °Generally, this is a safe procedure. However, as with any procedure, problems can occur. Possible problems include: °· Oversedation. °· Trouble breathing on your own. You may need to have a breathing tube until you are awake and breathing on your own. °· Allergic reaction to any of the medicines used for the procedure. °BEFORE THE PROCEDURE °· You may have blood tests done. These tests can help show how well your kidneys and liver are working. They can also show how well your blood clots. °· A physical exam will be done.   °· Only take medicines as directed by your health care provider. You may need to stop taking medicines (such as blood thinners, aspirin, or nonsteroidal anti-inflammatory drugs) before the procedure.   °· Do not eat or drink at least 6 hours before the procedure or as directed by your health care provider. °· Arrange for a responsible adult, family member, or friend to take you home  after the procedure. He or she should stay with you for at least 24 hours after the procedure, until the medicine has worn off. °PROCEDURE  °· An intravenous (IV) catheter will be inserted into one of your veins. Medicine will be able to flow directly into your body through this catheter. You may be given medicine through this tube to help prevent pain and help you relax. °· The medical or dental procedure will be done. °AFTER THE PROCEDURE °· You will stay in a recovery area until the medicine has worn off. Your blood pressure and pulse will be checked.   °·  Depending on the procedure you had, you may be allowed to go home when you can tolerate liquids and your pain is under control. °  °This information is not intended to replace advice given to you by your health care provider. Make sure you discuss any questions you have with your health care provider. °  °Document Released: 12/03/2000 Document Revised: 03/31/2014 Document Reviewed: 11/15/2012 °Elsevier Interactive Patient Education ©2016 [employer] ° °

## 2015-10-24 ENCOUNTER — Other Ambulatory Visit: Payer: Self-pay | Admitting: Neurosurgery

## 2015-11-29 ENCOUNTER — Other Ambulatory Visit: Payer: Self-pay | Admitting: General Surgery

## 2015-11-30 ENCOUNTER — Other Ambulatory Visit (HOSPITAL_COMMUNITY): Payer: Self-pay | Admitting: Neurosurgery

## 2015-11-30 ENCOUNTER — Encounter (HOSPITAL_COMMUNITY): Payer: Self-pay

## 2015-11-30 ENCOUNTER — Ambulatory Visit (HOSPITAL_COMMUNITY)
Admission: RE | Admit: 2015-11-30 | Discharge: 2015-11-30 | Disposition: A | Discharge status: Home / Self Care | Payer: Medicaid Other | Source: Ambulatory Visit | Attending: Neurosurgery | Admitting: Neurosurgery

## 2015-11-30 DIAGNOSIS — Q282 Arteriovenous malformation of cerebral vessels: Secondary | ICD-10-CM

## 2015-11-30 DIAGNOSIS — M797 Fibromyalgia: Secondary | ICD-10-CM | POA: Diagnosis not present

## 2015-11-30 DIAGNOSIS — F419 Anxiety disorder, unspecified: Secondary | ICD-10-CM | POA: Diagnosis not present

## 2015-11-30 DIAGNOSIS — F329 Major depressive disorder, single episode, unspecified: Secondary | ICD-10-CM | POA: Diagnosis not present

## 2015-11-30 DIAGNOSIS — Z8679 Personal history of other diseases of the circulatory system: Secondary | ICD-10-CM | POA: Diagnosis not present

## 2015-11-30 DIAGNOSIS — R569 Unspecified convulsions: Secondary | ICD-10-CM | POA: Diagnosis not present

## 2015-11-30 DIAGNOSIS — I10 Essential (primary) hypertension: Secondary | ICD-10-CM | POA: Insufficient documentation

## 2015-11-30 DIAGNOSIS — Z48812 Encounter for surgical aftercare following surgery on the circulatory system: Secondary | ICD-10-CM | POA: Diagnosis not present

## 2015-11-30 DIAGNOSIS — G43909 Migraine, unspecified, not intractable, without status migrainosus: Secondary | ICD-10-CM | POA: Insufficient documentation

## 2015-11-30 DIAGNOSIS — Z87891 Personal history of nicotine dependence: Secondary | ICD-10-CM | POA: Insufficient documentation

## 2015-11-30 HISTORY — PX: IR GENERIC HISTORICAL: IMG1180011

## 2015-11-30 LAB — BASIC METABOLIC PANEL
ANION GAP: 7 (ref 5–15)
BUN: 10 mg/dL (ref 6–20)
CALCIUM: 8.7 mg/dL — AB (ref 8.9–10.3)
CO2: 21 mmol/L — ABNORMAL LOW (ref 22–32)
Chloride: 111 mmol/L (ref 101–111)
Creatinine, Ser: 0.76 mg/dL (ref 0.44–1.00)
GLUCOSE: 87 mg/dL (ref 65–99)
Potassium: 3.6 mmol/L (ref 3.5–5.1)
SODIUM: 139 mmol/L (ref 135–145)

## 2015-11-30 LAB — URINALYSIS, ROUTINE W REFLEX MICROSCOPIC
Bilirubin Urine: NEGATIVE
Glucose, UA: NEGATIVE mg/dL
Hgb urine dipstick: NEGATIVE
Ketones, ur: NEGATIVE mg/dL
LEUKOCYTES UA: NEGATIVE
NITRITE: NEGATIVE
PH: 7 (ref 5.0–8.0)
Protein, ur: NEGATIVE mg/dL
SPECIFIC GRAVITY, URINE: 1.017 (ref 1.005–1.030)

## 2015-11-30 LAB — CBC WITH DIFFERENTIAL/PLATELET
BASOS ABS: 0 10*3/uL (ref 0.0–0.1)
BASOS PCT: 0 %
Eosinophils Absolute: 0.1 10*3/uL (ref 0.0–0.7)
Eosinophils Relative: 2 %
HEMATOCRIT: 41 % (ref 36.0–46.0)
Hemoglobin: 13.5 g/dL (ref 12.0–15.0)
Lymphocytes Relative: 33 %
Lymphs Abs: 2.3 10*3/uL (ref 0.7–4.0)
MCH: 31.8 pg (ref 26.0–34.0)
MCHC: 32.9 g/dL (ref 30.0–36.0)
MCV: 96.5 fL (ref 78.0–100.0)
MONO ABS: 0.7 10*3/uL (ref 0.1–1.0)
Monocytes Relative: 10 %
NEUTROS ABS: 3.8 10*3/uL (ref 1.7–7.7)
Neutrophils Relative %: 55 %
Platelets: 230 10*3/uL (ref 150–400)
RBC: 4.25 MIL/uL (ref 3.87–5.11)
RDW: 13 % (ref 11.5–15.5)
WBC: 6.8 10*3/uL (ref 4.0–10.5)

## 2015-11-30 LAB — APTT: APTT: 35 s (ref 24–36)

## 2015-11-30 LAB — PROTIME-INR
INR: 1.07
Prothrombin Time: 13.9 seconds (ref 11.4–15.2)

## 2015-11-30 MED ORDER — MIDAZOLAM HCL 2 MG/2ML IJ SOLN
INTRAMUSCULAR | Status: AC | PRN
  Administered 2015-11-30: 1 mg via INTRAVENOUS

## 2015-11-30 MED ORDER — MIDAZOLAM HCL 2 MG/2ML IJ SOLN
INTRAMUSCULAR | Status: AC
  Filled 2015-11-30: qty 2

## 2015-11-30 MED ORDER — FENTANYL CITRATE (PF) 100 MCG/2ML IJ SOLN
INTRAMUSCULAR | Status: AC | PRN
  Administered 2015-11-30: 25 ug via INTRAVENOUS

## 2015-11-30 MED ORDER — SODIUM CHLORIDE 0.9 % IV SOLN: INTRAVENOUS | Status: DC

## 2015-11-30 MED ORDER — LIDOCAINE HCL 1 % IJ SOLN
INTRAMUSCULAR | Status: AC
  Filled 2015-11-30: qty 20

## 2015-11-30 MED ORDER — FENTANYL CITRATE (PF) 100 MCG/2ML IJ SOLN
INTRAMUSCULAR | Status: AC
  Filled 2015-11-30: qty 2

## 2015-11-30 MED ORDER — SODIUM CHLORIDE 0.9 % IV SOLN
INTRAVENOUS | Status: AC | PRN
  Administered 2015-11-30: 10 mL/h via INTRAVENOUS

## 2015-11-30 MED ORDER — HEPARIN SODIUM (PORCINE) 1000 UNIT/ML IJ SOLN
INTRAMUSCULAR | Status: AC | PRN
  Administered 2015-11-30: 2000 [IU] via INTRAVENOUS

## 2015-11-30 MED ORDER — HYDROCODONE-ACETAMINOPHEN 5-325 MG PO TABS
1.0000 | ORAL_TABLET | ORAL | Status: DC | PRN
  Administered 2015-11-30: 2 via ORAL

## 2015-11-30 MED ORDER — LIDOCAINE HCL 1 % IJ SOLN
INTRAMUSCULAR | Status: AC | PRN
  Administered 2015-11-30: 8 mL

## 2015-11-30 MED ORDER — IOPAMIDOL (ISOVUE-300) INJECTION 61%
INTRAVENOUS | Status: AC
  Administered 2015-11-30: 45 mL
  Filled 2015-11-30: qty 100

## 2015-11-30 MED ORDER — CHLORHEXIDINE GLUCONATE CLOTH 2 % EX PADS: 6.0000 | MEDICATED_PAD | Freq: Once | CUTANEOUS | Status: DC

## 2015-11-30 MED ORDER — HEPARIN SODIUM (PORCINE) 1000 UNIT/ML IJ SOLN
INTRAMUSCULAR | Status: AC
  Filled 2015-11-30: qty 2

## 2015-11-30 MED ORDER — HYDROCODONE-ACETAMINOPHEN 5-325 MG PO TABS
ORAL_TABLET | ORAL | Status: AC
  Administered 2015-11-30: 2 via ORAL
  Filled 2015-11-30: qty 2

## 2015-11-30 NOTE — Op Note (Signed)
DIAGNOSTIC CEREBRAL ANGIOGRAM    OPERATOR:   Dr. Consuella Lose, MD  HISTORY:   The patient is a 40 y.o. yo female with a history of previously treated right parietal arteriovenous malformation. She had previously undergone Onyx embolization, followed by stereotactic radiosurgery on 04/14/2013 to 18 gray. She continues to have seizures, and is on multiple antiepileptic medications. She presents today for routine follow-up diagnostic cerebral angiogram now more than 2 years after treatment.  APPROACH:   The technical aspects of the procedure as well as its potential risks and benefits were reviewed with the patient. These risks included but were not limited bleeding, infection, allergic reaction, damage to organs/vital structures, stroke, non-diagnostic procedure, and the catastrophic outcomes of heart attack, coma, and death. With an understanding of these risks, informed consent was obtained and witnessed.    The patient was placed in the supine position on the angiography table and the skin of right groin prepped in the usual sterile fashion. The procedure was performed under local anesthesia (1%-solution of bicarbonate-bufferred Lidoacaine) and conscious sedation with Versed and fentanyl monitored by the in-suite nurse.    A 5- French sheath was introduced in the right common femoral artery using Seldinger technique.  A fluorophase sequence was used to document the sheath position.    HEPARIN: 2000 Units total.   CONTRAST AGENT: 40cc, Omnipaque 300   FLUOROSCOPY TIME: 1.8 combined AP and lateral minutes    CATHETER(S) AND WIRE(S):    5-French JB-1 glidecatheter   0.035" glidewire    VESSELS CATHETERIZED:   Right internal carotid   Left internal carotid   Right vertebral   Left vertebral   Right common femoral  VESSELS STUDIED:   Right internal carotid, head Right vertebral Left internal carotid, head Left vertebral Right femoral  PROCEDURAL NARRATIVE:   A 5-Fr JB-1 terumo  glide catheter was advanced over a 0.035 glidewire into the aortic arch. The above vessels were then sequentially catheterized and cervical/cerebral angiograms taken. After review of images, the catheter was removed without incident.    INTERPRETATION:   Right internal carotid: head:   Injection reveals the presence of a widely patent ICA, M1, and A1 segments and their branches. Onyx cast is seen overlying the right parietal convexity cortex. The previously described right parietal arteriovenous malformation is no longer visualized. There is no early venous opacification. No aneurysms are identified. The parenchymal and venous phases are normal. The venous sinuses are widely patent.    Left internal carotid: head:   Injection reveals the presence of a widely patent ICA, A1, and M1 segments and their branches. There is no significant stenosis, occlusion or aneurysm. There is no anterior cerebral artery supply to the right parietal arteriovenous malformation. The parenchymal and venous phases are normal. The venous sinuses are widely patent.  Right vertebral:   Injection reveals the presence of a widely patent vertebral artery. This leads to a widely patent basilar artery that terminates in bilateral P1. The basilar apex is normal. The previously visualized right PCA supply to the AVM is no longer visualized. There is no early venous drainage seen. The parenchymal and venous phases are normal. The venous sinuses are widely patent.    Left vertebral:    Normal vessel. No PICA aneurysm. See basilar description above.    Right femoral:    Normal vessel. No significant atherosclerotic disease. Arterial sheath in adequate position.   DISPOSITION:  Upon completion of the study, the femoral sheath was removed and hemostasis obtained using  a 5-Fr ExoSeal closure device. Good proximal and distal lower extremity pulses were documented upon achievement of hemostasis.    The procedure was well tolerated and  no early complications were observed.       The patient was transferred back to the holding area to be positioned flat in bed for 3 hours of observation.    IMPRESSION:  1. Complete occlusion of previously treated right parietal AVM now 31 months after SRS. No aneurysms (arterial or venous) are seen.  The preliminary results of this procedure were shared with the patient and the patient's family.

## 2015-11-30 NOTE — Sedation Documentation (Signed)
5 Fr. Exoseal to right groin 

## 2015-11-30 NOTE — Discharge Instructions (Signed)

## 2015-11-30 NOTE — H&P (Signed)
CC:  AVM  HPI: Katie [name] is a 40 y.o. female who previously underwent embolization and SRS treatment to a right parietal AVM >2 yrs ago. She now presents for follow-up diagnostic cerebral angiogram.  PMH: Past Medical History:  Diagnosis Date  . Anxiety   . AVM (arteriovenous malformation)   . Cerebral aneurysm   . Depression   . Fibromyalgia   . Hypertension   . Liver hemangioma   . Migraine   . Seizures (HCC)     PSH: Past Surgical History:  Procedure Laterality Date  . CEREBRAL ANGIOGRAM    . CEREBRAL EMBOLIZATION  dec 2013  . DILITATION & CURRETTAGE/HYSTROSCOPY WITH NOVASURE ABLATION N/A 08/04/2013   Procedure: DILATATION & CURETTAGE/HYSTEROSCOPY WITH NOVASURE ABLATION;  Surgeon: Annalee Genta, DO;  Location: [facility];  Service: Gynecology;  Laterality: N/A;  . radiation brain    . TUBAL LIGATION    . WISDOM TOOTH EXTRACTION  2001    SH: Social History  Substance Use Topics  . Smoking status: Former Research scientist (life sciences)  . Smokeless tobacco: Never Used     Comment: Quit 2007  . Alcohol use 0.6 oz/week    1 Cans of beer per week     Comment: occasional    MEDS: Prior to Admission medications   Medication Sig Start Date End Date Taking? Authorizing Provider  acetaminophen (TYLENOL) 325 MG tablet Take 650 mg by mouth every 6 (six) hours as needed for moderate pain or headache.   Yes Historical Provider, MD  cetirizine (ZYRTEC) 10 MG tablet Take 10 mg by mouth daily.   Yes Historical Provider, MD  clonazePAM (KLONOPIN) 1 MG tablet Take 1 mg by mouth daily as needed for anxiety.  11/21/14  Yes Historical Provider, MD  diphenhydrAMINE (BENADRYL) 25 MG tablet Take 1 tablet (25 mg total) by mouth every 6 (six) hours. Patient taking differently: Take 50 mg by mouth every 6 (six) hours as needed for allergies.  08/16/15  Yes Charlesetta Shanks, MD  HYDROcodone-acetaminophen (NORCO) 7.5-325 MG per tablet Take 1 tablet by mouth 3 (three) times daily as needed for moderate pain.  02/21/14   Yes Historical Provider, MD  lacosamide (VIMPAT) 200 MG TABS tablet Take 1 tablet (200 mg total) by mouth 2 (two) times daily. 08/15/15  Yes Ward Givens, NP  lamoTRIgine (LAMICTAL) 100 MG tablet Take 3 tablets (300 mg total) by mouth 2 (two) times daily. 08/17/15  Yes Ward Givens, NP  methocarbamol (ROBAXIN) 750 MG tablet Take 1500 mg by mouth once daily as needed for muscle spasms 11/21/14  Yes Historical Provider, MD  montelukast (SINGULAIR) 10 MG tablet Take 10 mg by mouth daily.   Yes Historical Provider, MD  ondansetron (ZOFRAN) 4 MG tablet Take 1 tablet (4 mg total) by mouth every 8 (eight) hours as needed for nausea or vomiting. 06/21/14  Yes Ward Givens, NP  PARoxetine (PAXIL) 40 MG tablet Take 40 mg by mouth daily 12/18/14  Yes Historical Provider, MD  topiramate (TOPAMAX) 100 MG tablet One at am, two at night Patient taking differently: Take 100 mg by mouth See admin instructions. 100 mg in AM and 200 mg in PM 02/21/15  Yes Marcial Pacas, MD  vitamin B-12 (CYANOCOBALAMIN) 100 MCG tablet Take 100 mcg by mouth daily.   Yes Historical Provider, MD  Botulinum Toxin Type A 200 UNITS SOLR Botox injections every three months. Patient not taking: Reported on 09/17/2015 02/01/15   Marcial Pacas, MD  butalbital-acetaminophen-caffeine (FIORICET) 418 [id number] 2521 MG tablet Take 1-2 tablets  by mouth every 6 (six) hours as needed for headache. Patient not taking: Reported on 09/17/2015 01/25/15 01/25/16  Marcial Pacas, MD  clotrimazole-betamethasone (LOTRISONE) cream Apply to affected area 2 times daily Patient not taking: Reported on 10/23/2015 02/03/15 02/03/16  Johnn Hai, PA-C  EPINEPHrine (EPIPEN 2-PAK) 0.3 mg/0.3 mL IJ SOAJ injection Inject 0.3 mLs (0.3 mg total) into the muscle once. 02/11/15   Lisa Roca, MD  metoCLOPramide (REGLAN) 10 MG tablet Take 1 tablet (10 mg total) by mouth every 6 (six) hours as needed for nausea (nausea/headache). Take with Benadryl 25 mg. Patient not taking: Reported on 11/23/2015  08/16/15   Charlesetta Shanks, MD  metroNIDAZOLE (METROGEL) 0.75 % vaginal gel Apply vaginally daily as needed for infection 11/21/14   Historical Provider, MD  ranitidine (ZANTAC) 150 MG tablet Take 1 tablet (150 mg total) by mouth 2 (two) times daily. Patient not taking: Reported on 09/17/2015 02/03/15 02/03/16  Johnn Hai, PA-C    ALLERGY: No Known Allergies  ROS: ROS  NEUROLOGIC EXAM: Awake, alert, oriented Memory and concentration grossly intact Speech fluent, appropriate CN grossly intact Motor exam: Upper Extremities Deltoid Bicep Tricep Grip  Right 5/5 5/5 5/5 5/5  Left 5/5 5/5 5/5 5/5   Lower Extremity IP Quad PF DF EHL  Right 5/5 5/5 5/5 5/5 5/5  Left 5/5 5/5 5/5 5/5 5/5   Sensation grossly intact to LT  IMPRESSION: - 40 y.o. female s/p treatment of a right parietal AVM  PLAN: - proceed with diagnostic cerebral angiogram  I have reviewed the risks/benefits and alternatives of the angiogram. She is very familiar having had the procedure before. All questions were answered.

## 2015-11-30 NOTE — Progress Notes (Signed)
Came in at 1030 to administer pain med for pain of "6" on scale and pt was asleep.She is now awake and  States her pain is a "6" and request pain med at this time,. Administered per order

## 2015-12-20 ENCOUNTER — Encounter (HOSPITAL_COMMUNITY): Payer: Self-pay | Admitting: Neurosurgery

## 2016-01-22 ENCOUNTER — Encounter: Payer: Self-pay | Admitting: Neurology

## 2016-01-22 ENCOUNTER — Ambulatory Visit (INDEPENDENT_AMBULATORY_CARE_PROVIDER_SITE_OTHER): Payer: Medicaid Other | Admitting: Neurology

## 2016-01-22 VITALS — BP 122/78 | HR 88 | Ht 62.0 in | Wt 180.2 lb

## 2016-01-22 DIAGNOSIS — Q282 Arteriovenous malformation of cerebral vessels: Secondary | ICD-10-CM | POA: Diagnosis not present

## 2016-01-22 DIAGNOSIS — G40209 Localization-related (focal) (partial) symptomatic epilepsy and epileptic syndromes with complex partial seizures, not intractable, without status epilepticus: Secondary | ICD-10-CM

## 2016-01-22 DIAGNOSIS — R404 Transient alteration of awareness: Secondary | ICD-10-CM

## 2016-01-22 HISTORY — DX: Localization-related (focal) (partial) symptomatic epilepsy and epileptic syndromes with complex partial seizures, not intractable, without status epilepticus: G40.209

## 2016-01-22 HISTORY — DX: Transient alteration of awareness: R40.4

## 2016-01-22 NOTE — Progress Notes (Signed)
NEUROLOGY CONSULTATION NOTE  Katie [name] MRN: [id number] DOB: 09-25-1975  Referring provider: Dr. Maurice Small Primary care provider: Dr. Maurice Small  Reason for consult:  Second opinion for seizures  Dear Dr Justin Mend:  Thank you for your kind referral of Katie [name] for consultation of the above symptoms. Although her history is well known to you, please allow me to reiterate it for the purpose of our medical record. The patient was accompanied to the clinic by her son who also provides collateral information. Records and images were personally reviewed where available.  HISTORY OF PRESENT ILLNESS This is a 40 year old right-handed woman with a complicated history presenting for a second opinion regarding her seizures. Records from her previous neurologist Dr. [name] were reviewed. She reports a history of migraines since age 82, as well as a different sensation on her left side for many years until 2012 when she had gradually worsening headaches and a seizures in August 2013. Her son witnessed the seizure and described stiffness with a little shaking, tongue bite. 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 She had endovascular embolization at [facility] but continued to have intractable headaches. She had an angiogram at [facility] in January 2015 which showed the patent right AVM, then underwent radiation therapy with Dr. Tammi Klippel. Follow-up scans showed stable changes post-embolization, she had a follow-up angiogram last month which showed complete occlusion of previously treated right parietal AVM 31 months after [facility]RS. No aneurysms were seen. She continued to report recurrent episodes of transient staring and confusion on Keppra. She was then started on Topamax, then Vimpat and Lamictal were added on, with increasing doses due to continued report of seizures. She describes her seizures as starting with a frontal headache, followed by tingling on  her left hand, feeling funny, then staring off. She can feel herself staring and is unable to speak, but can hear everything that is going on. Her husband states she would stare, sometimes her eyes would be closed, then she lays her head back and is "gone" from 1.5 up to 5 minutes. She would start taking deep breaths as she comes out of them or as they start. Sometimes she would taste blood as they come on. She reports having them on a near-daily basis, sometimes occurring multiple times a day. A few times her left arm and leg were shaking. She would feel very tired after for up to 2 days. She also reports a completely different type of recurrent episodes of confusion that occur around twice a week, last episode was yesterday. She has noticed that some of the seizures are stress-related. She reports waking up with tongue pain/bite last year. A couple of times she woke up with urinary incontinence 3-4 months ago.   She started having migraines at age 83, occurring around once a week, lasting a few days. They would start with pressure behind her eyes, with photo and phonophobia, no nausea/vomiting. She takes prn Reglan with some effect. She was getting Botox with Dr. [name] and trigger point injections with her pain specialist, which she felt helped. She has occasional dizzy spells. She sometimes stutters and cannot think. She feels weaker on her left side (arm and leg), and reports tingling in the toes of both feet, with some toes completely numb. She has neck and back pain, and underwent neck surgery last 02/2015 for decompression and fusion. She reports feeling tired despite getting 8 hours of  sleep. She became tearful that her husband complains of her sleeping all the time, stating she is not lazy, "I'm exhausted" and did not feel like getting out of bed. She stopped Lamotrigine 2 months ago because she was sleeping all the time. She continues on Topamax 100mg  in AM, 200mg  in PM and Vimpat 200mg  BID with no  significant side effects. She reports her mood is really bad. She does not feel the Paxil is helping. She is stressed about her father's medical condition, he recently moved in with them 1.5 months ago.   Epilepsy Risk Factors:  Right parietal AVM s/p embolization. Her mother had seizures after a car accident. Otherwise she had a normal birth and early development.  There is no history of febrile convulsions, CNS infections such as meningitis/encephalitis, significant traumatic brain injury.  Prior AEDs: Keppra   PAST MEDICAL HISTORY: Past Medical History:  Diagnosis Date  . Anxiety   . AVM (arteriovenous malformation)   . Cerebral aneurysm   . Depression   . Fibromyalgia   . Hypertension   . Liver hemangioma   . Migraine   . Seizures ([place])     PAST SURGICAL HISTORY: Past Surgical History:  Procedure Laterality Date  . CEREBRAL ANGIOGRAM    . CEREBRAL EMBOLIZATION  dec 2013  . DILITATION & CURRETTAGE/HYSTROSCOPY WITH NOVASURE ABLATION N/A 08/04/2013   Procedure: DILATATION & CURETTAGE/HYSTEROSCOPY WITH NOVASURE ABLATION;  Surgeon: Annalee Genta, DO;  Location: [facility];  Service: Gynecology;  Laterality: N/A;  . IR GENERIC HISTORICAL  11/30/2015   IR ANGIO INTRA EXTRACRAN SEL INTERNAL CAROTID BILAT MOD SED 11/30/2015 Consuella Lose, MD [facility]  . IR GENERIC HISTORICAL  11/30/2015   IR ANGIO VERTEBRAL SEL VERTEBRAL BILAT MOD SED 11/30/2015 Consuella Lose, MD [facility]  . radiation brain    . TUBAL LIGATION    . WISDOM TOOTH EXTRACTION  2001    MEDICATIONS: Current Outpatient Prescriptions on File Prior to Visit  Medication Sig Dispense Refill  . acetaminophen (TYLENOL) 325 MG tablet Take 650 mg by mouth every 6 (six) hours as needed for moderate pain or headache.    . Botulinum Toxin Type A 200 UNITS SOLR Botox injections every three months. (Patient not taking: Reported on 09/17/2015) 1 each 3  . butalbital-acetaminophen-caffeine (FIORICET) 50-325-40 MG tablet Take  1-2 tablets by mouth every 6 (six) hours as needed for headache. (Patient not taking: Reported on 09/17/2015) 20 tablet 5  . cetirizine (ZYRTEC) 10 MG tablet Take 10 mg by mouth daily.    . clonazePAM (KLONOPIN) 1 MG tablet Take 1 mg by mouth daily as needed for anxiety.   3  . clotrimazole-betamethasone (LOTRISONE) cream Apply to affected area 2 times daily (Patient not taking: Reported on 10/23/2015) 15 g 1  . diphenhydrAMINE (BENADRYL) 25 MG tablet Take 1 tablet (25 mg total) by mouth every 6 (six) hours. (Patient taking differently: Take 50 mg by mouth every 6 (six) hours as needed for allergies. ) 30 tablet 0  . EPINEPHrine (EPIPEN 2-PAK) 0.3 mg/0.3 mL IJ SOAJ injection Inject 0.3 mLs (0.3 mg total) into the muscle once. 1 Device 2  . HYDROcodone-acetaminophen (NORCO) 7.5-325 MG per tablet Take 1 tablet by mouth 3 (three) times daily as needed for moderate pain.   0  . lacosamide (VIMPAT) 200 MG TABS tablet Take 1 tablet (200 mg total) by mouth 2 (two) times daily. 60 tablet 5  . lamoTRIgine (LAMICTAL) 100 MG tablet Take 3 tablets (300 mg total) by  mouth 2 (two) times daily. 180 tablet 5  . methocarbamol (ROBAXIN) 750 MG tablet Take 1500 mg by mouth once daily as needed for muscle spasms  3  . metoCLOPramide (REGLAN) 10 MG tablet Take 1 tablet (10 mg total) by mouth every 6 (six) hours as needed for nausea (nausea/headache). Take with Benadryl 25 mg. (Patient not taking: Reported on 11/23/2015) 30 tablet 0  . metroNIDAZOLE (METROGEL) 0.75 % vaginal gel Apply vaginally daily as needed for infection  9  . montelukast (SINGULAIR) 10 MG tablet Take 10 mg by mouth daily.    . ondansetron (ZOFRAN) 4 MG tablet Take 1 tablet (4 mg total) by mouth every 8 (eight) hours as needed for nausea or vomiting. 30 tablet 5  . PARoxetine (PAXIL) 40 MG tablet Take 40 mg by mouth daily  3  . ranitidine (ZANTAC) 150 MG tablet Take 1 tablet (150 mg total) by mouth 2 (two) times daily. (Patient not taking: Reported on  09/17/2015) 30 tablet 1  . topiramate (TOPAMAX) 100 MG tablet One at am, two at night (Patient taking differently: Take 100 mg by mouth See admin instructions. 100 mg in AM and 200 mg in PM) 90 tablet 11  . vitamin B-12 (CYANOCOBALAMIN) 100 MCG tablet Take 100 mcg by mouth daily.    . [DISCONTINUED] esomeprazole (NEXIUM) 40 MG capsule Take 40 mg by mouth daily before breakfast. Dispense as written     No current facility-administered medications on file prior to visit.     ALLERGIES: No Known Allergies  FAMILY HISTORY: Family History  Problem Relation Age of Onset  . Bipolar disorder Mother   . Aneurysm Father   . Thyroid disease Maternal Grandmother   . Glaucoma Maternal Grandmother     SOCIAL HISTORY: Social History   Social History  . Marital status: Married    Spouse name: [name]  . Number of children: 2  . Years of education: college   Occupational History  .  [employer]    Disabled   Social History Main Topics  . Smoking status: Former Research scientist (life sciences)  . Smokeless tobacco: Never Used     Comment: Quit 2007  . Alcohol use 0.6 oz/week    1 Cans of beer per week     Comment: occasional  . Drug use: No  . Sexual activity: Yes    Birth control/ protection: Surgical   Other Topics Concern  . Not on file   Social History Narrative   Patient lives at home with her husband [name])    Disabled.   Education college   Both handed   Caffeine two per week          REVIEW OF SYSTEMS: Constitutional: No fevers, chills, or sweats, no generalized fatigue, change in appetite Eyes: No visual changes, double vision, eye pain Ear, nose and throat: No hearing loss, ear pain, nasal congestion, sore throat Cardiovascular: No chest pain, palpitations Respiratory:  No shortness of breath at rest or with exertion, wheezes GastrointestinaI: No nausea, vomiting, diarrhea, abdominal pain, fecal incontinence Genitourinary:  No dysuria, urinary retention or frequency Musculoskeletal:  No  neck pain, back pain Integumentary: No rash, pruritus, skin lesions Neurological: as above Psychiatric: + depression, no insomnia, +anxiety Endocrine: No palpitations, fatigue, diaphoresis, mood swings, change in appetite, change in weight, increased thirst Hematologic/Lymphatic:  No anemia, purpura, petechiae. Allergic/Immunologic: no itchy/runny eyes, nasal congestion, recent allergic reactions, rashes  PHYSICAL EXAM: Vitals:   01/22/16 1043  BP: 122/78  Pulse: 88  General: No acute distress, became tearful after she had a typical episode that they stated are her seizures. She closed her eyes and was unresponsive, her left hand was supporting her head. It appeared she fell asleep. She resisted eye opening. Episode lasted 2-3 minutes, then she started taking deep breaths and reported feeling tired after. Head:  Normocephalic/atraumatic Eyes: Fundoscopic exam shows bilateral sharp discs, no vessel changes, exudates, or hemorrhages Neck: supple, no paraspinal tenderness, full range of motion Back: No paraspinal tenderness Heart: regular rate and rhythm Lungs: Clear to auscultation bilaterally. Vascular: No carotid bruits. Skin/Extremities: No rash, no edema Neurological Exam: Mental status: alert and oriented to person, place, and time, no dysarthria or aphasia, Fund of knowledge is appropriate.  Recent and remote memory are intact.  Attention and concentration are normal.    Able to name objects and repeat phrases. Cranial nerves: CN I: not tested CN II: pupils equal, round and reactive to light, visual fields intact, fundi unremarkable. CN III, IV, VI:  full range of motion, no nystagmus, no ptosis CN V: decreased light touch, pin, and cold on the left V1-3, did not split midline with pin or tuning fork CN VII: upper and lower face symmetric CN VIII: hearing intact to finger rub CN IX, X: gag intact, uvula midline CN XI: sternocleidomastoid and trapezius muscles intact CN XII:  tongue midline Bulk & Tone: normal, no fasciculations. Motor: 5/5 throughout with no pronator drift, decreased fine finger movement on the left hand, no orbiting noted. Sensation: decreased light touch, cold, pin on the left UE and LE. Intact  vibration and joint position sense. Romberg test negative Deep Tendon Reflexes: brisk +3 throughout, no ankle clonus, negative Hoffman sign Plantar responses: downgoing bilaterally Cerebellar: no incoordination on finger to nose, heel to shin. No dysdiadochokinesia Gait: narrow-based and steady, able to tandem walk adequately. Tremor: none  IMPRESSION: This is a 40 year old right-handed woman with a history of right parietal AVM s/p embolization diagnosed after she had a seizure in 2013. She continues to report near-daily episodes of staring/unresponsiveness, as well as different episodes of confusion. Her son reports she has had infrequent left-sided shaking episodes. 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 I discussed with her that the daily episodes that were witnessed today would not respond to anti-epileptic medications, and that psychotherapy has been found to be helpful. She will be scheduled for a 48-hour EEG for seizure classification. She endorsed depression and increased stress, and agrees to [facility] Medicine referral. She stopped Lamictal. Continue current doses of Topamax and Vimpat. [place] driving laws were discussed with the patient, and she knows to stop driving after an episode of loss of awareness, until 6 months event-free. She will follow-up in 3 months.   Thank you for allowing me to participate in the care of this patient. Please do not hesitate to call for any questions or concerns.   Ellouise Newer, M.D.  CC: Dr. Justin Mend, Dr. Tammi Klippel

## 2016-01-22 NOTE — Patient Instructions (Signed)
1. Schedule 48-hour EEG 2. Refer to Psychiatry and psychotherapy for depression and stress seizures 3. Continue all your medications 4. As per [place] driving laws, for any episode of loss of awareness from any cause, no driving until 6 months event-free 5. Follow-up in 3 months, call for any changes

## 2016-01-23 ENCOUNTER — Telehealth: Payer: Self-pay

## 2016-01-23 NOTE — Telephone Encounter (Signed)
Contacted patient and verified that it's okay to send her Psychiatry and Psychotherapy referral to location in [place].

## 2016-01-28 ENCOUNTER — Ambulatory Visit (INDEPENDENT_AMBULATORY_CARE_PROVIDER_SITE_OTHER): Payer: Medicaid Other | Admitting: Neurology

## 2016-01-28 DIAGNOSIS — G40209 Localization-related (focal) (partial) symptomatic epilepsy and epileptic syndromes with complex partial seizures, not intractable, without status epilepticus: Secondary | ICD-10-CM

## 2016-01-28 DIAGNOSIS — R404 Transient alteration of awareness: Secondary | ICD-10-CM

## 2016-01-28 DIAGNOSIS — Q282 Arteriovenous malformation of cerebral vessels: Secondary | ICD-10-CM

## 2016-02-18 NOTE — Procedures (Signed)
ELECTROENCEPHALOGRAM REPORT  Dates of Recording: 01/28/2016 08:16 AM to 01/30/2016 08:33 AM  Patient's Name: Katie [name] MRN: [id number] Date of Birth: [birth year]  Referring Provider: Dr. Ellouise Newer  Procedure: 48-hour ambulatory EEG  History: This is a 40 year old woman with a history of AVM s/p surgery, with recurrent episodes of staring and confusion, and unresponsiveness. EEG for classification.   Medications: Lamictal, Vimpat, Topamax, clonazepam, Tylenol, Botox, Fioricet, Zyrtec, Lotrisone, Benadryl, Norco, Robaxin, Reglan, Zofran, Zantac, vitamin B12  Technical Summary: This is a 48-hour multichannel digital EEG recording measured by the international 10-20 system with electrodes applied with paste and impedances below 5000 ohms performed as portable with EKG monitoring.  The digital EEG was referentially recorded, reformatted, and digitally filtered in a variety of bipolar and referential montages for optimal display.    DESCRIPTION OF RECORDING: During maximal wakefulness, the background activity consisted of a symmetric 9 Hz posterior dominant rhythm which was reactive to eye opening.  There were no epileptiform discharges or focal slowing seen in wakefulness.  During the recording, the patient progresses through wakefulness, drowsiness, and Stage 2 sleep.  Again, there were no epileptiform discharges seen.  Events: Patient did not write down symptoms on diary but reported 2 events. She also had an episode prior to EEG recording.  On 11/7 at 1250 hours, she is lying on the couch with eyes closed. She pushes the button and eyes remain closed, with slight movements of the left hand. She appears asleep. Electrographically, there were no EEG or EKG changes seen.  On 11/7 at 2327 hours, she is lying on the bed, eyes are closed. It is unclear who pushes the event button. Electrographically, there were no EEG or EKG changes seen.  There were no electrographic seizures seen.  EKG  lead was unremarkable.  IMPRESSION: This 48-hour ambulatory EEG study is normal. There were 2 push button events as noted above with no epileptiform correlate seen.  CLINICAL CORRELATION: Events captured with eyes closed, apparent unresponsiveness although not tested, did not show any electrographic correlate, indicating these are non-epileptic. Her baseline EEG is normal. A normal EEG does not exclude a clinical diagnosis of epilepsy.  If further clinical questions remain, inpatient video EEG monitoring may be helpful.   Ellouise Newer, M.D.

## 2016-02-19 ENCOUNTER — Telehealth: Payer: Self-pay

## 2016-02-19 NOTE — Telephone Encounter (Signed)
Contacted patient. She states she has not heard from [facility]. Gave her their number to call and schedule. Patient states she has not had insurance but will be on her husbands starting Friday. She has been out of Vimpat for about a week and has cut her Topamax down to 1 tablet at night but should get Vimpat Friday. Patient states her lip and toes are numb. She states she had a blood taste in her mouth this morning and right afterwards had a seizure. Patient states this happened a couple days ago as well.  Wants to know if there is anything else you suggest she do.

## 2016-02-19 NOTE — Telephone Encounter (Signed)
-----   Message from Cameron Sprang, MD sent at 02/19/2016 12:32 PM EST ----- Pls let her know the brain wave test was normal. As we had discussed, with normal brain waves, the seizures she is having now are stress seizures. It is very important to see the psychiatrist and therapist as discussed. Continue all her medications. Thanks

## 2016-02-20 MED ORDER — LACOSAMIDE 100 MG PO TABS: 200.0000 mg | ORAL_TABLET | Freq: Two times a day (BID) | ORAL | 0 refills | Status: DC

## 2016-02-20 NOTE — Addendum Note (Signed)
Addended byAnnamaria Helling on: 02/20/2016 12:16 PM   Modules accepted: Orders

## 2016-02-20 NOTE — Telephone Encounter (Signed)
Per Dr. Delice Lesch contacted patient to see if she can come by office for samples. Patient states she will be in this morning to pick them up.

## 2016-03-12 ENCOUNTER — Telehealth: Payer: Self-pay | Admitting: Neurology

## 2016-03-12 MED ORDER — LACOSAMIDE 100 MG PO TABS: 200.0000 mg | ORAL_TABLET | Freq: Two times a day (BID) | ORAL | 3 refills | Status: DC

## 2016-03-12 NOTE — Telephone Encounter (Signed)
Katie [name] 1975/05/31. She was calling regarding needing a refill on her Vimpat medication. [facility] uses [facility] [address] in [place]. The pharmacy called as well and left a message. [facility] # is [id number] and fax # is [phone]. She said she is out of the samples as well.

## 2016-03-13 ENCOUNTER — Other Ambulatory Visit: Payer: Self-pay

## 2016-03-13 MED ORDER — LACOSAMIDE 100 MG PO TABS: 200.0000 mg | ORAL_TABLET | Freq: Two times a day (BID) | ORAL | 3 refills | Status: DC

## 2016-03-13 NOTE — Telephone Encounter (Signed)
Contacted patient  needed Vimpat RX out of samples.

## 2016-04-17 ENCOUNTER — Emergency Department
Admission: EM | Admit: 2016-04-17 | Discharge: 2016-04-17 | Disposition: A | Discharge status: Home / Self Care | Payer: Medicaid Other | Attending: Emergency Medicine | Admitting: Emergency Medicine

## 2016-04-17 ENCOUNTER — Encounter: Payer: Self-pay | Admitting: Emergency Medicine

## 2016-04-17 DIAGNOSIS — Z79899 Other long term (current) drug therapy: Secondary | ICD-10-CM | POA: Insufficient documentation

## 2016-04-17 DIAGNOSIS — R05 Cough: Secondary | ICD-10-CM | POA: Diagnosis present

## 2016-04-17 DIAGNOSIS — J40 Bronchitis, not specified as acute or chronic: Secondary | ICD-10-CM | POA: Insufficient documentation

## 2016-04-17 DIAGNOSIS — Z87891 Personal history of nicotine dependence: Secondary | ICD-10-CM | POA: Diagnosis not present

## 2016-04-17 DIAGNOSIS — I10 Essential (primary) hypertension: Secondary | ICD-10-CM | POA: Diagnosis not present

## 2016-04-17 MED ORDER — GUAIFENESIN-CODEINE 100-10 MG/5ML PO SOLN: 10.0000 mL | Freq: Three times a day (TID) | ORAL | 0 refills | Status: DC | PRN

## 2016-04-17 MED ORDER — ALBUTEROL SULFATE HFA 108 (90 BASE) MCG/ACT IN AERS: 2.0000 | INHALATION_SPRAY | RESPIRATORY_TRACT | 1 refills | Status: DC | PRN

## 2016-04-17 MED ORDER — AZITHROMYCIN 250 MG PO TABS: ORAL_TABLET | ORAL | 0 refills | Status: DC

## 2016-04-17 MED ORDER — PREDNISONE 10 MG PO TABS: 50.0000 mg | ORAL_TABLET | Freq: Every day | ORAL | 0 refills | Status: DC

## 2016-04-17 MED ORDER — ALBUTEROL SULFATE HFA 108 (90 BASE) MCG/ACT IN AERS: 2.0000 | INHALATION_SPRAY | RESPIRATORY_TRACT | 1 refills | Status: AC | PRN

## 2016-04-17 NOTE — ED Provider Notes (Signed)
[facility] Emergency Department Provider Note  ____________________________________________  Time seen: Approximately 4:52 PM  I have reviewed the triage vital signs and the nursing notes.   HISTORY  Chief Complaint Generalized Body Aches and Cough   HPI [name] is a 41 y.o. female who presents to the emergency department to the emergency department for evaluation of cough. She states she believes she had the flu about 2 weeks ago and her cough will not go away.She has taken over-the-counter cough medications without relief. She denies fever. Denies shortness of breath.   Past Medical History:  Diagnosis Date  . Anxiety   . AVM (arteriovenous malformation)   . Cerebral aneurysm   . Depression   . Fibromyalgia   . Hypertension   . Liver hemangioma   . Migraine   . Seizures [facility])     Patient Active Problem List   Diagnosis Date Noted  . Localization-related symptomatic epilepsy and epileptic syndromes with complex partial seizures, not intractable, without status epilepticus ([place]) 01/22/2016  . Transient alteration of awareness 01/22/2016  . Chronic migraine 01/25/2015  . Left-sided weakness 03/08/2014  . Headache   . Seizures ([place]) 06/03/2013  . Frequent headaches 06/03/2013  . AVM (arteriovenous malformation) brain 02/22/2013  . Cerebral aneurysm   . Myalgia and myositis 02/25/2012  . TORTICOLLIS 06/10/2010  . PAIN IN JOINT, MULTIPLE SITES 03/27/2010  . FREQUENCY, URINARY 03/01/2010  . HIP PAIN, LEFT 01/28/2010  . LIVER HEMANGIOMA 06/07/2009  . SHOULDER PAIN, RIGHT 10/31/2008  . DYSMENORRHEA 10/12/2008  . HEADACHE, TENSION 05/11/2008  . ALLERGIC RHINITIS 01/05/2008  . OVARIAN CYST, LEFT 11/03/2007  . OBESITY 06/04/2007  . ANXIETY 03/04/2007  . DEPRESSION 03/04/2007  . GERD 03/04/2007  . NEPHROLITHIASIS, HX OF 03/04/2007    Past Surgical History:  Procedure Laterality Date  . CEREBRAL ANGIOGRAM    . CEREBRAL EMBOLIZATION   dec 2013  . DILITATION & CURRETTAGE/HYSTROSCOPY WITH NOVASURE ABLATION N/A 08/04/2013   Procedure: DILATATION & CURETTAGE/HYSTEROSCOPY WITH NOVASURE ABLATION;  Surgeon: Annalee Genta, DO;  Location: [facility];  Service: Gynecology;  Laterality: N/A;  . IR GENERIC HISTORICAL  11/30/2015   IR ANGIO INTRA EXTRACRAN SEL INTERNAL CAROTID BILAT MOD SED 11/30/2015 Consuella Lose, MD [facility]  . IR GENERIC HISTORICAL  11/30/2015   IR ANGIO VERTEBRAL SEL VERTEBRAL BILAT MOD SED 11/30/2015 Consuella Lose, MD [facility]  . radiation brain    . TUBAL LIGATION    . WISDOM TOOTH EXTRACTION  2001    Prior to Admission medications   Medication Sig Start Date End Date Taking? Authorizing Provider  acetaminophen (TYLENOL) 325 MG tablet Take 650 mg by mouth every 6 (six) hours as needed for moderate pain or headache.    Historical Provider, MD  albuterol (PROVENTIL HFA;VENTOLIN HFA) 108 (90 Base) MCG/ACT inhaler Inhale 2 puffs into the lungs every 4 (four) hours as needed for wheezing or shortness of breath. 04/17/16   Arian Murley B Cayce Quezada, FNP  azithromycin (ZITHROMAX Z-PAK) 250 MG tablet Take 2 tablets (500 mg) on  Day 1,  followed by 1 tablet (250 mg) once daily on Days 2 through 5. 04/17/16   Mckenzy Salazar B Latonya Knight, FNP  cetirizine (ZYRTEC) 10 MG tablet Take 10 mg by mouth daily.    Historical Provider, MD  clonazePAM (KLONOPIN) 1 MG tablet Take 1 mg by mouth daily as needed for anxiety.  11/21/14   Historical Provider, MD  EPINEPHrine (EPIPEN 2-PAK) 0.3 mg/0.3 mL IJ SOAJ injection Inject 0.3 mLs (0.3  mg total) into the muscle once. 02/11/15   Lisa Roca, MD  guaiFENesin-codeine 100-10 MG/5ML syrup Take 10 mLs by mouth 3 (three) times daily as needed. 04/17/16   Victorino Dike, FNP  HYDROcodone-acetaminophen (NORCO) 7.5-325 MG per tablet Take 1 tablet by mouth 3 (three) times daily as needed for moderate pain.  02/21/14   Historical Provider, MD  Lacosamide (VIMPAT) 100 MG TABS Take 2 tablets (200 mg total) by mouth 2  (two) times daily. 03/13/16   Cameron Sprang, MD  methocarbamol (ROBAXIN) 750 MG tablet Take 1500 mg by mouth once daily as needed for muscle spasms 11/21/14   Historical Provider, MD  metroNIDAZOLE (METROGEL) 0.75 % vaginal gel Apply vaginally daily as needed for infection 11/21/14   Historical Provider, MD  montelukast (SINGULAIR) 10 MG tablet Take 10 mg by mouth daily.    Historical Provider, MD  PARoxetine (PAXIL) 40 MG tablet Take 40 mg by mouth daily 12/18/14   Historical Provider, MD  predniSONE (DELTASONE) 10 MG tablet Take 5 tablets (50 mg total) by mouth daily. 04/17/16   Victorino Dike, FNP  ranitidine (ZANTAC) 150 MG tablet Take 1 tablet (150 mg total) by mouth 2 (two) times daily. Patient not taking: Reported on 01/22/2016 02/03/15 02/03/16  Johnn Hai, PA-C  topiramate (TOPAMAX) 100 MG tablet One at am, two at night Patient taking differently: Take 100 mg by mouth See admin instructions. 100 mg in AM and 200 mg in PM 02/21/15   Marcial Pacas, MD  vitamin B-12 (CYANOCOBALAMIN) 100 MCG tablet Take 100 mcg by mouth daily.    Historical Provider, MD    Allergies Patient has no known allergies.  Family History  Problem Relation Age of Onset  . Bipolar disorder Mother   . Aneurysm Father   . Thyroid disease Maternal Grandmother   . Glaucoma Maternal Grandmother     Social History Social History  Substance Use Topics  . Smoking status: Former Research scientist (life sciences)  . Smokeless tobacco: Never Used     Comment: Quit 2007  . Alcohol use 0.6 oz/week    1 Cans of beer per week     Comment: occasional    Review of Systems Constitutional: Negative for fever/chills ENT: Negative for sore throat. Cardiovascular: Denies chest pain. Respiratory: Negative for shortness of breath. Positive for cough. Gastrointestinal: Negative for nausea,  negative for vomiting.  Negative for diarrhea.  Musculoskeletal: Positive for body aches Skin: Negative for rash. Neurological: Negative for  headaches ____________________________________________   PHYSICAL EXAM:  VITAL SIGNS: ED Triage Vitals  Enc Vitals Group     BP 04/17/16 1603 129/88     Pulse Rate 04/17/16 1603 81     Resp 04/17/16 1603 20     Temp 04/17/16 1603 98.5 F (36.9 C)     Temp Source 04/17/16 1603 Oral     SpO2 04/17/16 1603 96 %     Weight 04/17/16 1604 183 lb (83 kg)     Height 04/17/16 1604 5\' 2"  (1.575 m)     Head Circumference --      Peak Flow --      Pain Score 04/17/16 1605 4     Pain Loc --      Pain Edu? --      Excl. in [place]? --     Constitutional: Alert and oriented. Well appearing and in no acute distress. Eyes: Conjunctivae are normal. EOMI. Ears: Bilateral tympanic membranes within normal limits. Nose: No congestion; no rhinnorhea. Mouth/Throat:  Mucous membranes are moist.  Oropharynx normal. Tonsils appear normal without exudate. Neck: No stridor.  Lymphatic: No cervical lymphadenopathy. Cardiovascular: Normal rate, regular rhythm. Grossly normal heart sounds.  Good peripheral circulation. Respiratory: Normal respiratory effort.  No retractions. Expiratory wheeze noted in the left lower lobe, best sounds otherwise clear throughout.. Harsh, forceful, nonproductive cough noted. Gastrointestinal: Soft and nontender.  Musculoskeletal: FROM x 4 extremities.  Neurologic:  Normal speech and language.  Skin:  Skin is warm, dry and intact. No rash noted. Psychiatric: Mood and affect are normal. Speech and behavior are normal.  ____________________________________________   LABS (all labs ordered are listed, but only abnormal results are displayed)  Labs Reviewed - No data to display ____________________________________________  EKG   ____________________________________________  RADIOLOGY  Not indicated ____________________________________________   PROCEDURES  Procedure(s) performed: None  Critical Care performed:  No  ____________________________________________   INITIAL IMPRESSION / ASSESSMENT AND PLAN / ED COURSE     Pertinent labs & imaging results that were available during my care of the patient were reviewed by me and considered in my medical decision making (see chart for details).  41 year old female presenting to the emergency department for evaluation of cough that is present for the past 2 weeks. Exam is essentially benign with the exception of some faint expiratory wheezes noted in the left lower lobe and harsh forceful cough. She was treated with azithromycin, albuterol, prednisone, and Robitussin-AC. She was encouraged to follow up with primary care provider for choice or return to the emergency department for symptoms that change or worsen if she is unable schedule an appointment. ____________________________________________   FINAL CLINICAL IMPRESSION(S) / ED DIAGNOSES  Final diagnoses:  Bronchitis    Note:  This document was prepared using Dragon voice recognition software and may include unintentional dictation errors.     Victorino Dike, FNP 04/17/16 2135    Drenda Freeze, MD 04/20/16 2103

## 2016-04-17 NOTE — ED Triage Notes (Signed)
States thinks she may have had the flu about 2 weeks ago  conts to have cough

## 2016-04-17 NOTE — Discharge Instructions (Signed)
Follow up with the PCP for symptoms that are not improving over the next few days. Return to the ER for symptoms that change or worsen if you are unable to schedule an appointment.

## 2016-04-23 ENCOUNTER — Emergency Department
Admission: EM | Admit: 2016-04-23 | Discharge: 2016-04-23 | Disposition: A | Discharge status: Home / Self Care | Payer: Medicaid Other | Attending: Emergency Medicine | Admitting: Emergency Medicine

## 2016-04-23 DIAGNOSIS — I10 Essential (primary) hypertension: Secondary | ICD-10-CM | POA: Insufficient documentation

## 2016-04-23 DIAGNOSIS — Z79899 Other long term (current) drug therapy: Secondary | ICD-10-CM | POA: Diagnosis not present

## 2016-04-23 DIAGNOSIS — J069 Acute upper respiratory infection, unspecified: Secondary | ICD-10-CM | POA: Diagnosis not present

## 2016-04-23 DIAGNOSIS — J40 Bronchitis, not specified as acute or chronic: Secondary | ICD-10-CM | POA: Diagnosis not present

## 2016-04-23 DIAGNOSIS — R05 Cough: Secondary | ICD-10-CM | POA: Diagnosis present

## 2016-04-23 DIAGNOSIS — Z87891 Personal history of nicotine dependence: Secondary | ICD-10-CM | POA: Insufficient documentation

## 2016-04-23 MED ORDER — BENZONATATE 100 MG PO CAPS: 100.0000 mg | ORAL_CAPSULE | Freq: Three times a day (TID) | ORAL | 0 refills | Status: DC | PRN

## 2016-04-23 NOTE — ED Triage Notes (Signed)
Nonproductive cough and sore throat x 2 days. Pt had cough X 1 week ago and was placed on azithromycin. PT finished that and was beginning to feel better. Cough then started again X 2 days ago and sore throat, nonproductive this time.

## 2016-04-23 NOTE — Discharge Instructions (Signed)
Your vital signs are stable and your exam in essentially normal today. Continue to dose the previously prescribed meds. Increase fluid intake and consider using a room humidifier. Take the [facility] as directed and increase the use of your inhaler to every 4 hours as needed.

## 2016-04-23 NOTE — ED Notes (Signed)
Family comes out of room and states that mother had a sz.. Pt is alert and informs this nurse that she has sz's when she has an infection

## 2016-04-23 NOTE — ED Notes (Signed)
See triage note  Was seen about 1 week ago for dry cough  Thought she may have had the flu  Was placed on z pak and steroids  But states she is not any better

## 2016-04-27 NOTE — ED Provider Notes (Signed)
[facility] Emergency Department Provider Note ____________________________________________  Time seen: 1514  I have reviewed the triage vital signs and the nursing notes.  HISTORY  Chief Complaint  Cough and Sore Throat  HPI Katie [name] is a 41 y.o. female presents to the ED for evaluation now productive cough and sore throat for the last 2 days. Patient describes having a cough that began a week prior, and was placed on azithromycin. She cleared the course of azithromycin and steroids, and reports she felt better in the interim. Her cough again recurred about 2 days prior. She now has a sore throat associated with it.Denies any intermittent fevers, chest pain, shortness breath, or wheezing.  Past Medical History:  Diagnosis Date  . Anxiety   . AVM (arteriovenous malformation)   . Cerebral aneurysm   . Depression   . Fibromyalgia   . Hypertension   . Liver hemangioma   . Migraine   . Seizures [facility])     Patient Active Problem List   Diagnosis Date Noted  . Localization-related symptomatic epilepsy and epileptic syndromes with complex partial seizures, not intractable, without status epilepticus ([place]) 01/22/2016  . Transient alteration of awareness 01/22/2016  . Chronic migraine 01/25/2015  . Left-sided weakness 03/08/2014  . Headache   . Seizures ([place]) 06/03/2013  . Frequent headaches 06/03/2013  . AVM (arteriovenous malformation) brain 02/22/2013  . Cerebral aneurysm   . Myalgia and myositis 02/25/2012  . TORTICOLLIS 06/10/2010  . PAIN IN JOINT, MULTIPLE SITES 03/27/2010  . FREQUENCY, URINARY 03/01/2010  . HIP PAIN, LEFT 01/28/2010  . LIVER HEMANGIOMA 06/07/2009  . SHOULDER PAIN, RIGHT 10/31/2008  . DYSMENORRHEA 10/12/2008  . HEADACHE, TENSION 05/11/2008  . ALLERGIC RHINITIS 01/05/2008  . OVARIAN CYST, LEFT 11/03/2007  . OBESITY 06/04/2007  . ANXIETY 03/04/2007  . DEPRESSION 03/04/2007  . GERD 03/04/2007  . NEPHROLITHIASIS, HX OF  03/04/2007    Past Surgical History:  Procedure Laterality Date  . CEREBRAL ANGIOGRAM    . CEREBRAL EMBOLIZATION  dec 2013  . DILITATION & CURRETTAGE/HYSTROSCOPY WITH NOVASURE ABLATION N/A 08/04/2013   Procedure: DILATATION & CURETTAGE/HYSTEROSCOPY WITH NOVASURE ABLATION;  Surgeon: Annalee Genta, DO;  Location: [facility];  Service: Gynecology;  Laterality: N/A;  . IR GENERIC HISTORICAL  11/30/2015   IR ANGIO INTRA EXTRACRAN SEL INTERNAL CAROTID BILAT MOD SED 11/30/2015 Consuella Lose, MD [facility]  . IR GENERIC HISTORICAL  11/30/2015   IR ANGIO VERTEBRAL SEL VERTEBRAL BILAT MOD SED 11/30/2015 Consuella Lose, MD [facility]  . radiation brain    . TUBAL LIGATION    . WISDOM TOOTH EXTRACTION  2001    Prior to Admission medications   Medication Sig Start Date End Date Taking? Authorizing Provider  acetaminophen (TYLENOL) 325 MG tablet Take 650 mg by mouth every 6 (six) hours as needed for moderate pain or headache.    Historical Provider, MD  albuterol (PROVENTIL HFA;VENTOLIN HFA) 108 (90 Base) MCG/ACT inhaler Inhale 2 puffs into the lungs every 4 (four) hours as needed for wheezing or shortness of breath. 04/17/16   Cari B Triplett, FNP  azithromycin (ZITHROMAX Z-PAK) 250 MG tablet Take 2 tablets (500 mg) on  Day 1,  followed by 1 tablet (250 mg) once daily on Days 2 through 5. 04/17/16   Cari B Triplett, FNP  benzonatate (TESSALON PERLES) 100 MG capsule Take 1 capsule (100 mg total) by mouth 3 (three) times daily as needed for cough (Take 1-2 per dose). 04/23/16   [place],  PA-C  cetirizine (ZYRTEC) 10 MG tablet Take 10 mg by mouth daily.    Historical Provider, MD  clonazePAM (KLONOPIN) 1 MG tablet Take 1 mg by mouth daily as needed for anxiety.  11/21/14   Historical Provider, MD  EPINEPHrine (EPIPEN 2-PAK) 0.3 mg/0.3 mL IJ SOAJ injection Inject 0.3 mLs (0.3 mg total) into the muscle once. 02/11/15   Lisa Roca, MD  guaiFENesin-codeine 100-10 MG/5ML syrup Take 10 mLs by  mouth 3 (three) times daily as needed. 04/17/16   Victorino Dike, FNP  HYDROcodone-acetaminophen (NORCO) 7.5-325 MG per tablet Take 1 tablet by mouth 3 (three) times daily as needed for moderate pain.  02/21/14   Historical Provider, MD  Lacosamide (VIMPAT) 100 MG TABS Take 2 tablets (200 mg total) by mouth 2 (two) times daily. 03/13/16   Cameron Sprang, MD  methocarbamol (ROBAXIN) 750 MG tablet Take 1500 mg by mouth once daily as needed for muscle spasms 11/21/14   Historical Provider, MD  metroNIDAZOLE (METROGEL) 0.75 % vaginal gel Apply vaginally daily as needed for infection 11/21/14   Historical Provider, MD  montelukast (SINGULAIR) 10 MG tablet Take 10 mg by mouth daily.    Historical Provider, MD  PARoxetine (PAXIL) 40 MG tablet Take 40 mg by mouth daily 12/18/14   Historical Provider, MD  predniSONE (DELTASONE) 10 MG tablet Take 5 tablets (50 mg total) by mouth daily. 04/17/16   Victorino Dike, FNP  ranitidine (ZANTAC) 150 MG tablet Take 1 tablet (150 mg total) by mouth 2 (two) times daily. Patient not taking: Reported on 01/22/2016 02/03/15 02/03/16  Johnn Hai, PA-C  topiramate (TOPAMAX) 100 MG tablet One at am, two at night Patient taking differently: Take 100 mg by mouth See admin instructions. 100 mg in AM and 200 mg in PM 02/21/15   Marcial Pacas, MD  vitamin B-12 (CYANOCOBALAMIN) 100 MCG tablet Take 100 mcg by mouth daily.    Historical Provider, MD    Allergies Patient has no known allergies.  Family History  Problem Relation Age of Onset  . Bipolar disorder Mother   . Aneurysm Father   . Thyroid disease Maternal Grandmother   . Glaucoma Maternal Grandmother     Social History Social History  Substance Use Topics  . Smoking status: Former Research scientist (life sciences)  . Smokeless tobacco: Never Used     Comment: Quit 2007  . Alcohol use 0.6 oz/week    1 Cans of beer per week     Comment: occasional    Review of Systems  Constitutional: Negative for fever. Eyes: Negative for visual  changes. ENT: Positive for sore throat. Cardiovascular: Negative for chest pain. Respiratory: Negative for shortness of breath. Gastrointestinal: Negative for abdominal pain, vomiting and diarrhea. ____________________________________________  PHYSICAL EXAM:  VITAL SIGNS: ED Triage Vitals  Enc Vitals Group     BP 04/23/16 1354 138/81     Pulse Rate 04/23/16 1354 93     Resp 04/23/16 1354 18     Temp 04/23/16 1354 97.5 F (36.4 C)     Temp Source 04/23/16 1354 Oral     SpO2 04/23/16 1354 100 %     Weight 04/23/16 1355 183 lb (83 kg)     Height 04/23/16 1355 5\' 2"  (1.575 m)     Head Circumference --      Peak Flow --      Pain Score 04/23/16 1355 2     Pain Loc --      Pain Edu? --  Excl. in [place]? --    Constitutional: Alert and oriented. Well appearing and in no distress. Head: Normocephalic and atraumatic. Eyes: Conjunctivae are normal. PERRL. Normal extraocular movements Ears: Canals clear. TMs intact bilaterally. Nose: No congestion/rhinorrhea/epistaxis. Mouth/Throat: Mucous membranes are moist. Uvula is midline and tonsils are flat. No oropharyngeal lesions are noted. Neck: Supple. No thyromegaly. Hematological/Lymphatic/Immunological: No cervical lymphadenopathy. Cardiovascular: Normal rate, regular rhythm. Normal distal pulses. Respiratory: Normal respiratory effort. No wheezes/rales/rhonchi. Gastrointestinal: Soft and nontender. No distention. Musculoskeletal: Nontender with normal range of motion in all extremities.  Neurologic:  Normal gait without ataxia. Normal speech and language. No gross focal neurologic deficits are appreciated. Skin:  Skin is warm, dry and intact. No rash noted. ____________________________________________  INITIAL IMPRESSION / ASSESSMENT AND PLAN / ED COURSE  Patient with symptoms that likely represent a viral URI. She also has a mild bronchitis. She is discharged with prescription for Tessalon Perles to dose as directed. Continue those  over-the-counter Mucinex, albuterol, and her DM cough syrup. She will follow with primary care provider return to the ED as needed. ____________________________________________  FINAL CLINICAL IMPRESSION(S) / ED DIAGNOSES  Final diagnoses:  Viral upper respiratory tract infection  Bronchitis      Melvenia Needles, PA-C 04/27/16 [place], MD 04/28/16 [phone]

## 2016-04-28 ENCOUNTER — Ambulatory Visit: Payer: Medicaid Other | Admitting: Neurology

## 2016-05-02 ENCOUNTER — Encounter: Payer: Self-pay | Admitting: Neurology

## 2016-05-02 NOTE — Progress Notes (Signed)
No Show

## 2016-05-05 ENCOUNTER — Telehealth: Payer: Self-pay | Admitting: Radiation Oncology

## 2016-05-05 ENCOUNTER — Ambulatory Visit
Admission: RE | Admit: 2016-05-05 | Discharge: 2016-05-05 | Disposition: A | Discharge status: Home / Self Care | Payer: Medicaid Other | Source: Ambulatory Visit | Attending: Radiation Oncology | Admitting: Radiation Oncology

## 2016-05-05 DIAGNOSIS — Q282 Arteriovenous malformation of cerebral vessels: Secondary | ICD-10-CM

## 2016-05-05 NOTE — Telephone Encounter (Signed)
Patient hasn't shown for follow up. Phoned patient's cell. No answer. Left message requesting return call to reschedule.

## 2016-05-16 ENCOUNTER — Other Ambulatory Visit: Payer: Self-pay | Admitting: Family Medicine

## 2016-05-16 DIAGNOSIS — R1011 Right upper quadrant pain: Secondary | ICD-10-CM

## 2016-06-03 ENCOUNTER — Other Ambulatory Visit: Payer: Medicaid Other

## 2016-06-10 ENCOUNTER — Telehealth: Payer: Self-pay | Admitting: Neurology

## 2016-06-10 NOTE — Telephone Encounter (Signed)
Katie [name] [birth year]. [phone]  She had called after hours yesterday 06/09/16 to make an appointment for a follow up appointment. No answer had to leave message. She stated yesterday with answering service she had quite a few seizures over the past week  And a half (12) and that her pain management would not see her until she followed up with Dr. Delice Lesch. She had a seizure while at pain management. Thank you

## 2016-06-11 NOTE — Telephone Encounter (Signed)
Patient is calling back to stating she got a call about this please call patient at [phone]

## 2016-06-12 NOTE — Telephone Encounter (Signed)
Patient is calling back please call her at [phone]

## 2016-06-12 NOTE — Telephone Encounter (Signed)
Returned pt [facility] - [facility] to schedule 3 mth f/u. Also advsd if seizure is acute and severe she should be evaluated at nearest ER.

## 2016-06-19 ENCOUNTER — Ambulatory Visit (INDEPENDENT_AMBULATORY_CARE_PROVIDER_SITE_OTHER): Payer: Medicaid Other | Admitting: Neurology

## 2016-06-19 ENCOUNTER — Encounter: Payer: Self-pay | Admitting: Neurology

## 2016-06-19 VITALS — BP 100/60 | HR 85 | Temp 98.4°F | Resp 16 | Ht 61.75 in | Wt 181.1 lb

## 2016-06-19 DIAGNOSIS — F445 Conversion disorder with seizures or convulsions: Secondary | ICD-10-CM

## 2016-06-19 DIAGNOSIS — G40209 Localization-related (focal) (partial) symptomatic epilepsy and epileptic syndromes with complex partial seizures, not intractable, without status epilepticus: Secondary | ICD-10-CM | POA: Diagnosis not present

## 2016-06-19 DIAGNOSIS — Q282 Arteriovenous malformation of cerebral vessels: Secondary | ICD-10-CM | POA: Diagnosis not present

## 2016-06-19 DIAGNOSIS — G43009 Migraine without aura, not intractable, without status migrainosus: Secondary | ICD-10-CM | POA: Diagnosis not present

## 2016-06-19 MED ORDER — LACOSAMIDE 100 MG PO TABS: 200.0000 mg | ORAL_TABLET | Freq: Two times a day (BID) | ORAL | 5 refills | Status: DC

## 2016-06-19 MED ORDER — TOPIRAMATE 100 MG PO TABS: ORAL_TABLET | ORAL | 11 refills | Status: AC

## 2016-06-19 NOTE — Progress Notes (Signed)
NEUROLOGY FOLLOW UP OFFICE NOTE  Katie [name] [id number]  HISTORY OF PRESENT ILLNESS: I had the pleasure of seeing Katie [name] in follow-up in the neurology clinic on 06/19/2016.  The patient was last seen 5 months ago for seizures.  and is accompanied by her husband who helps supplement the history today.  Records and images were personally reviewed where available. She was reporting seizures on a near-daily basis, sometimes occurring multiple times a day. She had a 48-hour EEG which was normal, there were no epileptiform discharges seen. There were 2 events captured, on both it appears her eyes are closed, electrographically there were no EEG changes seen, indicating psychogenic non-epileptic events. She had called our office that her Pain Management team would not see her until her follow-up here because she had quite a few seizures the past week and a half (12), one while at Pain Management. She and her husband report she had been doing fairly well with around one seizure a day until the past 3 weeks when she has had 5-6 seizures daily. She had a small one at her parent-teacher conference today where her husband described she had her head in her hands and eyes were closed like she was sleeping, unresponsive for a few minutes. I discussed with them that these are consistent with psychogenic non-epileptic events. Her husband reports that within the past month, she sometimes hums with her seizures. She reports that the other day she had a seizure where she could tell her eyes were rolling, she started tasting blood before and after but did not bite her tongue. She has woke up with her tongue bitten and jaw hurting bad a couple of weeks ago. She takes Topamax 100mg  in AM, 200mg  in PM and Vimpat 200mg  BID without side effects. She sees pain management for the headaches and gets trigger point injections. She had previously been getting Botox from Dr. [name], last course was a year ago. The migraines have  decreased with the trigger point injections.   HPI 01/22/2016: This is a 41 yo RH woman with a complicated history with seizures. Records from her previous neurologist Dr. [name] were reviewed. She reports a history of migraines since age 62, as well as a different sensation on her left side for many years until 2012 when she had gradually worsening headaches and a seizures in August 2013. Her son witnessed the seizure and described stiffness with a little shaking, tongue bite. 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 She had endovascular embolization at [facility] but continued to have intractable headaches. She had an angiogram at [facility] in January 2015 which showed the patent right AVM, then underwent radiation therapy with Dr. Tammi Klippel. Follow-up scans showed stable changes post-embolization, she had a follow-up angiogram last month which showed complete occlusion of previously treated right parietal AVM 31 months after [facility]RS. No aneurysms were seen. She continued to report recurrent episodes of transient staring and confusion on Keppra. She was then started on Topamax, then Vimpat and Lamictal were added on, with increasing doses due to continued report of seizures. She describes her seizures as starting with a frontal headache, followed by tingling on her left hand, feeling funny, then staring off. She can feel herself staring and is unable to speak, but can hear everything that is going on. Her husband states she would stare, sometimes her eyes would be closed, then she lays her head back and is "gone"  from 1.5 up to 5 minutes. She would start taking deep breaths as she comes out of them or as they start. Sometimes she would taste blood as they come on. She reports having them on a near-daily basis, sometimes occurring multiple times a day. A few times her left arm and leg were shaking. She would feel very tired after for up to 2 days. She also reports a  completely different type of recurrent episodes of confusion that occur around twice a week, last episode was yesterday. She has noticed that some of the seizures are stress-related. She reports waking up with tongue pain/bite last year. A couple of times she woke up with urinary incontinence 3-4 months ago.   She started having migraines at age 27, occurring around once a week, lasting a few days. They would start with pressure behind her eyes, with photo and phonophobia, no nausea/vomiting. She takes prn Reglan with some effect. She was getting Botox with Dr. [name] and trigger point injections with her pain specialist, which she felt helped. She has occasional dizzy spells. She sometimes stutters and cannot think. She feels weaker on her left side (arm and leg), and reports tingling in the toes of both feet, with some toes completely numb. She has neck and back pain, and underwent neck surgery last 02/2015 for decompression and fusion. She reports feeling tired despite getting 8 hours of sleep. She became tearful that her husband complains of her sleeping all the time, stating she is not lazy, "I'm exhausted" and did not feel like getting out of bed. She stopped Lamotrigine 2 months ago because she was sleeping all the time. She continues on Topamax 100mg  in AM, 200mg  in PM and Vimpat 200mg  BID with no significant side effects. She reports her mood is really bad. She does not feel the Paxil is helping. She is stressed about her father's medical condition, he recently moved in with them 1.5 months ago.   Epilepsy Risk Factors:  Right parietal AVM s/p embolization. Her mother had seizures after a car accident. Otherwise she had a normal birth and early development.  There is no history of febrile convulsions, CNS infections such as meningitis/encephalitis, significant traumatic brain injury.  Prior AEDs: Keppra  PAST MEDICAL HISTORY: Past Medical History:  Diagnosis Date  . Anxiety   . AVM  (arteriovenous malformation)   . Cerebral aneurysm   . Depression   . Fibromyalgia   . Hypertension   . Liver hemangioma   . Migraine   . Seizures ([place])     MEDICATIONS: Current Outpatient Prescriptions on File Prior to Visit  Medication Sig Dispense Refill  . acetaminophen (TYLENOL) 325 MG tablet Take 650 mg by mouth every 6 (six) hours as needed for moderate pain or headache.    . albuterol (PROVENTIL HFA;VENTOLIN HFA) 108 (90 Base) MCG/ACT inhaler Inhale 2 puffs into the lungs every 4 (four) hours as needed for wheezing or shortness of breath. 1 Inhaler 1  . azithromycin (ZITHROMAX Z-PAK) 250 MG tablet Take 2 tablets (500 mg) on  Day 1,  followed by 1 tablet (250 mg) once daily on Days 2 through 5. 6 each 0  . benzonatate (TESSALON PERLES) 100 MG capsule Take 1 capsule (100 mg total) by mouth 3 (three) times daily as needed for cough (Take 1-2 per dose). 30 capsule 0  . cetirizine (ZYRTEC) 10 MG tablet Take 10 mg by mouth daily.    . clonazePAM (KLONOPIN) 1 MG tablet Take 1 mg by mouth  daily as needed for anxiety.   3  . EPINEPHrine (EPIPEN 2-PAK) 0.3 mg/0.3 mL IJ SOAJ injection Inject 0.3 mLs (0.3 mg total) into the muscle once. 1 Device 2  . guaiFENesin-codeine 100-10 MG/5ML syrup Take 10 mLs by mouth 3 (three) times daily as needed. 120 mL 0  . HYDROcodone-acetaminophen (NORCO) 7.5-325 MG per tablet Take 1 tablet by mouth 3 (three) times daily as needed for moderate pain.   0  . Lacosamide (VIMPAT) 100 MG TABS Take 2 tablets (200 mg total) by mouth 2 (two) times daily. 180 tablet 3  . methocarbamol (ROBAXIN) 750 MG tablet Take 1500 mg by mouth once daily as needed for muscle spasms  3  . metroNIDAZOLE (METROGEL) 0.75 % vaginal gel Apply vaginally daily as needed for infection  9  . montelukast (SINGULAIR) 10 MG tablet Take 10 mg by mouth daily.    Marland Kitchen PARoxetine (PAXIL) 40 MG tablet Take 40 mg by mouth daily  3  . predniSONE (DELTASONE) 10 MG tablet Take 5 tablets (50 mg total) by  mouth daily. 25 tablet 0  . ranitidine (ZANTAC) 150 MG tablet Take 1 tablet (150 mg total) by mouth 2 (two) times daily. (Patient not taking: Reported on 01/22/2016) 30 tablet 1  . topiramate (TOPAMAX) 100 MG tablet One at am, two at night (Patient taking differently: Take 100 mg by mouth See admin instructions. 100 mg in AM and 200 mg in PM) 90 tablet 11  . vitamin B-12 (CYANOCOBALAMIN) 100 MCG tablet Take 100 mcg by mouth daily.    . [DISCONTINUED] esomeprazole (NEXIUM) 40 MG capsule Take 40 mg by mouth daily before breakfast. Dispense as written     No current facility-administered medications on file prior to visit.     ALLERGIES: No Known Allergies  FAMILY HISTORY: Family History  Problem Relation Age of Onset  . Bipolar disorder Mother   . Aneurysm Father   . Thyroid disease Maternal Grandmother   . Glaucoma Maternal Grandmother     SOCIAL HISTORY: Social History   Social History  . Marital status: Married    Spouse name: [name]  . Number of children: 2  . Years of education: college   Occupational History  .  [employer]    Disabled   Social History Main Topics  . Smoking status: Former Research scientist (life sciences)  . Smokeless tobacco: Never Used     Comment: Quit 2007  . Alcohol use 0.6 oz/week    1 Cans of beer per week     Comment: occasional  . Drug use: No  . Sexual activity: Yes    Birth control/ protection: Surgical   Other Topics Concern  . Not on file   Social History Narrative   Patient lives at home with her husband [name])    Disabled.   Education college   Both handed   Caffeine two per week          REVIEW OF SYSTEMS: Constitutional: No fevers, chills, or sweats, no generalized fatigue, change in appetite Eyes: No visual changes, double vision, eye pain Ear, nose and throat: No hearing loss, ear pain, nasal congestion, sore throat Cardiovascular: No chest pain, palpitations Respiratory:  No shortness of breath at rest or with exertion,  wheezes GastrointestinaI: No nausea, vomiting, diarrhea, abdominal pain, fecal incontinence Genitourinary:  No dysuria, urinary retention or frequency Musculoskeletal:  No neck pain, back pain Integumentary: No rash, pruritus, skin lesions Neurological: as above Psychiatric: No depression, insomnia, anxiety Endocrine: No palpitations, fatigue,  diaphoresis, mood swings, change in appetite, change in weight, increased thirst Hematologic/Lymphatic:  No anemia, purpura, petechiae. Allergic/Immunologic: no itchy/runny eyes, nasal congestion, recent allergic reactions, rashes  PHYSICAL EXAM: Vitals:   06/19/16 1331  BP: 100/60  Pulse: 85  Resp: 16  Temp: 98.4 F (36.9 C)   General: No acute distress Head:  Normocephalic/atraumatic Neck: supple, no paraspinal tenderness, full range of motion Heart:  Regular rate and rhythm Lungs:  Clear to auscultation bilaterally Back: No paraspinal tenderness Skin/Extremities: No rash, no edema Neurological Exam: alert and oriented to person, place, and time. No aphasia or dysarthria. Fund of knowledge is appropriate.  Recent and remote memory are intact.  Attention and concentration are normal.    Able to name objects and repeat phrases. Cranial nerves: Pupils equal, round, reactive to light.  Extraocular movements intact with no nystagmus. Visual fields full. Facial sensation intact. No facial asymmetry. Tongue, uvula, palate midline.  Motor: Bulk and tone normal, muscle strength 5/5 throughout with no pronator drift.  Sensation to light touch intact.  No extinction to double simultaneous stimulation.  Deep tendon reflexes 2+ throughout, toes downgoing.  Finger to nose testing intact.  Gait narrow-based and steady, able to tandem walk adequately.  Romberg negative.  IMPRESSION: This is a 41 yo RH woman with a history of right parietal AVM s/p embolization diagnosed after she had a seizure in 2013. She was reporting to report near-daily episodes of  staring/unresponsiveness, as well as different episodes of confusion. She had a typical episode on her initial visit with eyes closed, resisted eye opening, unresponsive, consistent with a psychogenic non-epileptic event. Her 48-hour EEG was normal, she had 2 events captured, on both it appears her eyes are closed, electrographically there were no EEG changes seen, indicating psychogenic non-epileptic events. 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 I discussed PNES with them, she is agreeable to seeing psychiatry and doing cognitive behavioral therapy. We discussed that there are no contraindications to doing any procedures with her physicians for these events (or even with the epileptic seizures), we discussed what to do in these events, to make sure she is in a safe place, talk in a reassuring way, and letting her rest after. No need to go to ER each time. She reports continued migraines and will increase Topamax to 200mg  BID for migraine prophylaxis, continue Vimpat 200mg  BID. She asks about Adderall, it is a stimulant, would be okay with starting this if helpful, and monitor if any change in seizures. [place] driving laws were discussed with the patient, and she knows to stop driving after an episode of loss of awareness, until 6 months event-free. She will follow-up in 4 months and knows to call for any changes.  Thank you for allowing me to participate in her care.  Please do not hesitate to call for any questions or concerns.  The duration of this appointment visit was 25 minutes of face-to-face time with the patient.  Greater than 50% of this time was spent in counseling, explanation of diagnosis, planning of further management, and coordination of care.   Ellouise Newer, M.D.   CC: Dr. Justin Mend

## 2016-06-19 NOTE — Patient Instructions (Addendum)
1. Increase Topamax to 200mg  twice a day for the migraines 2. Continue Vimpat 200mg  twice a day 3. Refer to Behavioral Medicine for Psychiatry and Cognitive Behavioral Therapy 4. Follow-up in 4 months, call for any changes  Seizure Precautions:1. If medication has been prescribed for you to prevent seizures, take it exactly as directed.  Do not stop taking the medicine without talking to your doctor first, even if you have not had a seizure in a long time.   2. Avoid activities in which a seizure would cause danger to yourself or to others.  Don't operate dangerous machinery, swim alone, or climb in high or dangerous places, such as on ladders, roofs, or girders.  Do not drive unless your doctor says you may.  3. If you have any warning that you may have a seizure, lay down in a safe place where you can't hurt yourself.    4.  No driving for 6 months from last seizure, as per [facility].   Please refer to the following link on the [place] website for more information: http://www.epilepsyfoundation.org/answerplace/Social/driving/drivingu.cfm   5.  Maintain good sleep hygiene. Avoid alcohol.  6.  Notify your neurology if you are planning pregnancy or if you become pregnant.  7.  Contact your doctor if you have any problems that may be related to the medicine you are taking.  8.  Call 911 and bring the patient back to the ED if:        A.  The seizure lasts longer than 5 minutes.       B.  The patient doesn't awaken shortly after the seizure  C.  The patient has new problems such as difficulty seeing, speaking or moving  D.  The patient was injured during the seizure  E.  The patient has a temperature over 102 F (39C)  F.  The patient vomited and now is having trouble breathing

## 2016-06-26 DIAGNOSIS — F445 Conversion disorder with seizures or convulsions: Secondary | ICD-10-CM | POA: Insufficient documentation

## 2016-06-26 DIAGNOSIS — G43009 Migraine without aura, not intractable, without status migrainosus: Secondary | ICD-10-CM

## 2016-06-26 HISTORY — DX: Conversion disorder with seizures or convulsions: F44.5

## 2016-06-26 HISTORY — DX: Migraine without aura, not intractable, without status migrainosus: G43.009

## 2016-06-30 ENCOUNTER — Encounter: Payer: Self-pay | Admitting: Neurology

## 2016-07-15 ENCOUNTER — Ambulatory Visit (HOSPITAL_COMMUNITY): Payer: Medicaid Other | Admitting: Psychology

## 2016-08-05 ENCOUNTER — Other Ambulatory Visit: Payer: Self-pay | Admitting: Neurosurgery

## 2016-08-05 DIAGNOSIS — M5136 Other intervertebral disc degeneration, lumbar region: Secondary | ICD-10-CM

## 2016-08-05 DIAGNOSIS — M50223 Other cervical disc displacement at C6-C7 level: Secondary | ICD-10-CM

## 2016-08-13 ENCOUNTER — Ambulatory Visit
Admission: RE | Admit: 2016-08-13 | Discharge: 2016-08-13 | Disposition: A | Discharge status: Home / Self Care | Payer: Medicaid Other | Source: Ambulatory Visit | Attending: Neurosurgery | Admitting: Neurosurgery

## 2016-08-13 DIAGNOSIS — M5136 Other intervertebral disc degeneration, lumbar region: Secondary | ICD-10-CM

## 2016-08-13 DIAGNOSIS — M50223 Other cervical disc displacement at C6-C7 level: Secondary | ICD-10-CM

## 2016-08-14 ENCOUNTER — Telehealth: Payer: Self-pay | Admitting: Neurology

## 2016-08-14 NOTE — Telephone Encounter (Signed)
She already sees Pain Management, ok to send our notes to them. Thanks

## 2016-08-14 NOTE — Telephone Encounter (Signed)
PT called and said that the pain management clinic has not received the office notes from Dr Delice Lesch and they need them before they will refill her pain meds and asks if it can be done today/Dawn

## 2016-08-14 NOTE — Telephone Encounter (Signed)
Office notes from [place] (3/29) faxed to [facility] per pt request.

## 2016-08-27 ENCOUNTER — Other Ambulatory Visit: Payer: Self-pay | Admitting: Family Medicine

## 2016-08-27 ENCOUNTER — Other Ambulatory Visit (HOSPITAL_COMMUNITY)
Admission: RE | Admit: 2016-08-27 | Discharge: 2016-08-27 | Disposition: A | Discharge status: Home / Self Care | Payer: Medicaid Other | Source: Ambulatory Visit | Attending: Family Medicine | Admitting: Family Medicine

## 2016-08-27 DIAGNOSIS — Z1151 Encounter for screening for human papillomavirus (HPV): Secondary | ICD-10-CM | POA: Insufficient documentation

## 2016-08-27 DIAGNOSIS — Z01411 Encounter for gynecological examination (general) (routine) with abnormal findings: Secondary | ICD-10-CM | POA: Insufficient documentation

## 2016-08-29 ENCOUNTER — Telehealth: Payer: Self-pay | Admitting: Neurology

## 2016-08-29 NOTE — Telephone Encounter (Signed)
Caller: Sharan  Urgent? No  Reason for the call: She will be starting a New Job on Monday ans she said she is very excited about it. Her hours will be 9 to 5:30. It is 12 miles from her house. She has not had a seizure in a while. She was wanting to know could she get her License back? Please call. Thanks

## 2016-09-01 ENCOUNTER — Other Ambulatory Visit: Payer: Medicaid Other

## 2016-09-01 LAB — CYTOLOGY - PAP
Diagnosis: NEGATIVE
HPV (WINDOPATH): NOT DETECTED

## 2016-09-01 NOTE — Telephone Encounter (Signed)
Spoke with pt.  Let her know that per [place] law, she cannot drive until 6 months seizure free.  Pt understood.

## 2016-09-09 ENCOUNTER — Other Ambulatory Visit: Payer: Self-pay | Admitting: Neurosurgery

## 2016-09-09 DIAGNOSIS — M50223 Other cervical disc displacement at C6-C7 level: Secondary | ICD-10-CM

## 2016-09-26 ENCOUNTER — Other Ambulatory Visit: Payer: Self-pay | Admitting: Neurology

## 2016-09-26 DIAGNOSIS — G40209 Localization-related (focal) (partial) symptomatic epilepsy and epileptic syndromes with complex partial seizures, not intractable, without status epilepticus: Secondary | ICD-10-CM

## 2016-09-30 ENCOUNTER — Ambulatory Visit: Payer: Medicaid Other | Admitting: Neurology

## 2016-10-24 IMAGING — CT CT ANGIO HEAD
1 of 11 series · 1 of 33 positions shown · IV contrast (Iohexol (Omnipaque 350))
Comparison: Multiple previous studies, most recently MR 12/04/2013,
CT 11/14/2013, angiography 04/08/2013.

CLINICAL DATA: Patient with right parietal arteriovenous
malformation, previously embolized and radiated. Presents with
left-sided weakness and abnormal sensations.

EXAM:
CT ANGIOGRAPHY HEAD
TECHNIQUE: Multidetector CT imaging of the head was performed using the
standard protocol during bolus administration of intravenous
contrast. Multiplanar CT image reconstructions and MIPs were
obtained to evaluate the vascular anatomy.
CONTRAST:  50mL OMNIPAQUE IOHEXOL 350 MG/ML SOLN

[Series 300: locator · axial · 0.49mm/px · 1 of 1 slices shown]
[im 1/1  soft-tissue]
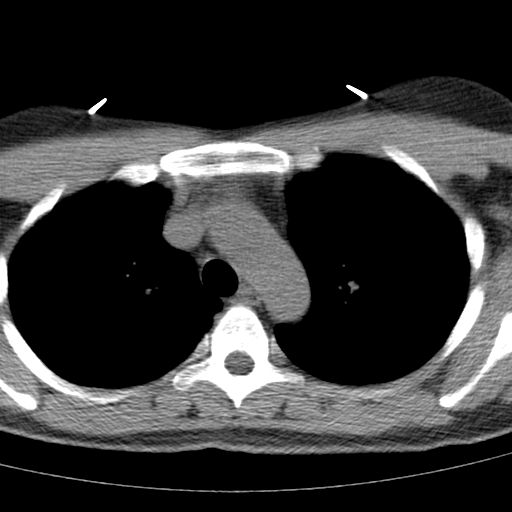

[1 of 33 positions shown; findings below may reference images not displayed]

FINDINGS: Precontrast CT shows hyperdensity related to embolic material within
the arteriovenous malformation in the right parietal region. There
is some atrophy, encephalomalacia and gliosis in that region. No
subarachnoid or intraparenchymal bleeding. No evidence of mass
effect. No evidence of acute infarction. No hydrocephalus or
extra-axial collection. No calvarial abnormality. Sinuses, middle
ears and mastoids are clear.

Both internal carotid arteries are widely patent into the brain. The
siphon regions appear normal. The anterior and middle cerebral
vessels are widely patent bilaterally. There is some residual
arteriovenous malformation nidus with both deep and superficial
venous drainage. No abnormality on the left. No posterior
circulation pathology. Both vertebral arteries are widely patent to
the basilar.

Review of the MIP images confirms the above findings.
IMPRESSION: No acute finding. No evidence of intraparenchymal or subarachnoid
hemorrhage.

Previously treated right parietal arteriovenous malformation with
previous embolization and radiation. Some regional volume loss/
gliosis. Some persistent nidus with both deep and superficial venous
drainage.

No evidence of stenotic or occlusive disease.

Critical Value/emergent results were called by telephone at the time
of interpretation on 03/08/2014 at [DATE] to Dr. DREY JIM ,
who verbally acknowledged these results.

## 2016-11-17 ENCOUNTER — Ambulatory Visit
Admission: RE | Admit: 2016-11-17 | Discharge: 2016-11-17 | Disposition: A | Discharge status: Home / Self Care | Payer: Medicaid Other | Source: Ambulatory Visit | Attending: Neurosurgery | Admitting: Neurosurgery

## 2016-11-17 DIAGNOSIS — M50223 Other cervical disc displacement at C6-C7 level: Secondary | ICD-10-CM

## 2016-11-25 ENCOUNTER — Telehealth: Payer: Self-pay | Admitting: Neurology

## 2016-11-25 NOTE — Telephone Encounter (Signed)
Follow-up as scheduled before we fill out any forms/letters. thanks

## 2016-11-25 NOTE — Telephone Encounter (Signed)
Spoke with Katie [name].  She is wanting Dr. Delice Lesch to write a letter stating that she has been seizure free for over 6 months.  I let her know that the process is that we receive a packet from the [facility], fill out the neurological sections, and send it back to the [facility] for review.  She says that the [facility] may have sent the packet to her last neurologist.  She states that she received a traffic ticket for driving on suspended license.  Her court date has been pushed to 9/20, and her lawyer suggest that it will look better for her if she has her license re-instated prior to court.  I let her know that I will pass the message along to Dr. Delice Lesch, and that she can discuss all of this at her office visit on Thursday 11/27/16, to which she replied "oh, you think I need to come to that appointment?"  I told her yes, and she stated that she only made the appointment because it would look good for her court date.  Please advise.

## 2016-11-25 NOTE — Telephone Encounter (Signed)
Patient wants to talk to someone about driving

## 2016-11-27 ENCOUNTER — Ambulatory Visit: Payer: Self-pay | Admitting: Neurology

## 2016-12-02 ENCOUNTER — Encounter: Payer: Self-pay | Admitting: Neurology

## 2016-12-03 ENCOUNTER — Telehealth: Payer: Self-pay | Admitting: Neurology

## 2016-12-03 NOTE — Telephone Encounter (Signed)
Patient dismissed from [facility] by Ellouise Newer MD, effective December 02, 2016. Dismissal letter sent out by certified / registered mail.  daj

## 2016-12-12 ENCOUNTER — Telehealth: Payer: Self-pay | Admitting: Neurology

## 2016-12-12 NOTE — Telephone Encounter (Signed)
Patient called needing to see Dr. Delice Lesch for a follow up. She is having to go to court because she was caught driving while her license was taken away due to having Seizures. She said she will go to court on  Oct 9th and 17 th she will be going back to court. She said her lawyer had advised her to get her license re instated? Her lawyer would like a letter from Dr. Delice Lesch stating she can get her license prior to the court date.  Please Advise. Thanks

## 2016-12-15 ENCOUNTER — Telehealth: Payer: Self-pay | Admitting: Neurology

## 2016-12-15 NOTE — Telephone Encounter (Signed)
Pt dismissed from practice - effective 12/02/16

## 2016-12-15 NOTE — Telephone Encounter (Signed)
Patient is calling to discuss seizures and getting her license reinstated.. She has not had a seizure in 6 months.

## 2016-12-15 NOTE — Telephone Encounter (Signed)
She was last seen in May 2017.  She has a pending appt on 12/22/16.

## 2016-12-22 ENCOUNTER — Encounter: Payer: Self-pay | Admitting: Adult Health

## 2016-12-22 ENCOUNTER — Ambulatory Visit (INDEPENDENT_AMBULATORY_CARE_PROVIDER_SITE_OTHER): Payer: Medicaid Other | Admitting: Adult Health

## 2016-12-22 VITALS — BP 129/90 | HR 99 | Ht 61.75 in | Wt 162.2 lb

## 2016-12-22 DIAGNOSIS — F445 Conversion disorder with seizures or convulsions: Secondary | ICD-10-CM | POA: Diagnosis not present

## 2016-12-22 DIAGNOSIS — R569 Unspecified convulsions: Secondary | ICD-10-CM

## 2016-12-22 DIAGNOSIS — G43009 Migraine without aura, not intractable, without status migrainosus: Secondary | ICD-10-CM
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
## 2016-12-22 NOTE — Patient Instructions (Addendum)
Your Plan:  Continue Topamax and Vimpat If your symptoms worsen or you develop new symptoms please let us know.    Thank you for coming to see [place] at [facility]. I hope we have been able to provide you high quality care today.  You may receive a patient satisfaction survey over the next few weeks. We would appreciate your feedback and comments so that we may continue to improve ourselves and the health of our patients.

## 2017-01-01 NOTE — Telephone Encounter (Signed)
Certified dismissal letter returned as undeliverable, unclaimed, return to sender after three attempts by [employer] on January 01, 2017 Letter placed in another envelope and resent as 1st class mail which does not require a signature. [email or web address]

## 2017-01-05 ENCOUNTER — Telehealth: Payer: Self-pay | Admitting: Adult Health

## 2017-01-05 NOTE — Telephone Encounter (Signed)
Patient is calling to follow up on paperwork she left to be filled out for DMV.

## 2017-01-05 NOTE — Telephone Encounter (Signed)
DMV filled out, to Dr. [name] for review.

## 2017-01-05 NOTE — Telephone Encounter (Signed)
Sigend by Dr. [name].  To MRI for faxing.

## 2017-01-06 ENCOUNTER — Emergency Department (HOSPITAL_COMMUNITY)
Admission: EM | Admit: 2017-01-06 | Discharge: 2017-01-06 | Disposition: A | Discharge status: Home / Self Care | Payer: Medicaid Other | Attending: Emergency Medicine | Admitting: Emergency Medicine

## 2017-01-06 ENCOUNTER — Encounter (HOSPITAL_COMMUNITY): Payer: Self-pay | Admitting: *Deleted

## 2017-01-06 ENCOUNTER — Emergency Department (HOSPITAL_COMMUNITY): Payer: Medicaid Other

## 2017-01-06 DIAGNOSIS — Z79899 Other long term (current) drug therapy: Secondary | ICD-10-CM | POA: Insufficient documentation

## 2017-01-06 DIAGNOSIS — R079 Chest pain, unspecified: Secondary | ICD-10-CM | POA: Diagnosis not present

## 2017-01-06 DIAGNOSIS — E860 Dehydration: Secondary | ICD-10-CM | POA: Insufficient documentation

## 2017-01-06 DIAGNOSIS — Z87891 Personal history of nicotine dependence: Secondary | ICD-10-CM | POA: Insufficient documentation

## 2017-01-06 DIAGNOSIS — I10 Essential (primary) hypertension: Secondary | ICD-10-CM | POA: Insufficient documentation

## 2017-01-06 LAB — CBC
HEMATOCRIT: 44.7 % (ref 36.0–46.0)
Hemoglobin: 15.2 g/dL — ABNORMAL HIGH (ref 12.0–15.0)
MCH: 32 pg (ref 26.0–34.0)
MCHC: 34 g/dL (ref 30.0–36.0)
MCV: 94.1 fL (ref 78.0–100.0)
PLATELETS: 229 10*3/uL (ref 150–400)
RBC: 4.75 MIL/uL (ref 3.87–5.11)
RDW: 13.2 % (ref 11.5–15.5)
WBC: 7.8 10*3/uL (ref 4.0–10.5)

## 2017-01-06 LAB — BASIC METABOLIC PANEL
Anion gap: 8 (ref 5–15)
BUN: 8 mg/dL (ref 6–20)
CALCIUM: 9 mg/dL (ref 8.9–10.3)
CO2: 17 mmol/L — ABNORMAL LOW (ref 22–32)
CREATININE: 0.68 mg/dL (ref 0.44–1.00)
Chloride: 112 mmol/L — ABNORMAL HIGH (ref 101–111)
GFR calc Af Amer: >60 mL/min (ref >60)
GFR calc non Af Amer: >60 mL/min (ref >60)
GLUCOSE: 92 mg/dL (ref 65–99)
POTASSIUM: 3.5 mmol/L (ref 3.5–5.1)
Sodium: 137 mmol/L (ref 135–145)

## 2017-01-06 LAB — I-STAT TROPONIN, ED: Troponin i, poc: 0 ng/mL (ref 0.00–0.08)

## 2017-01-06 LAB — D-DIMER, QUANTITATIVE: D-Dimer, Quant: <0.27 ug/mL-FEU (ref 0.00–0.50)

## 2017-01-06 MED ORDER — HYDROCODONE-ACETAMINOPHEN 5-325 MG PO TABS
1.0000 | ORAL_TABLET | Freq: Once | ORAL | Status: AC
  Administered 2017-01-06: 1 via ORAL
  Filled 2017-01-06: qty 1

## 2017-01-06 MED ORDER — SODIUM CHLORIDE 0.9 % IV BOLUS (SEPSIS)
500.0000 mL | Freq: Once | INTRAVENOUS | Status: AC
  Administered 2017-01-06: 500 mL via INTRAVENOUS

## 2017-01-06 NOTE — ED Provider Notes (Signed)
[facility] EMERGENCY DEPARTMENT Provider Note   CSN: [id number] Arrival date & time: 01/06/17  1258     History   Chief Complaint Chief Complaint  Patient presents with  . Chest Pain  . Shortness of Breath    HPI Katie [name] is a 41 y.o. female.  HPI Patient presents with left upper chest pain. Began yesterday. Has some shortness of breath with it. Pain is worse with breathing. States was seen by her primary care doctor, Dr. Justin Mend yesterday. 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 Has had nausea without vomiting. Has had a little bit of cough. States she started to have some numbness in her hands. States the pain slight when up her left neck also. History of anxiety. 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 Past Medical History:  Diagnosis Date  . Anxiety   . AVM (arteriovenous malformation)   . Cerebral aneurysm   . Depression   . Fibromyalgia   . Hypertension   . Liver hemangioma   . Migraine   . Seizures [facility])     Patient Active Problem List   Diagnosis Date Noted  . 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 Migraine without aura and without status migrainosus, not intractable 06/26/2016  . Localization-related symptomatic epilepsy and epileptic syndromes with complex partial seizures, not intractable, without status epilepticus ([place]) 01/22/2016  . Transient alteration of awareness 01/22/2016  . Chronic migraine 01/25/2015  . Left-sided weakness 03/08/2014  . Headache   . Seizures ([place]) 06/03/2013  . Frequent headaches 06/03/2013  . AVM (arteriovenous malformation) brain 02/22/2013  . Cerebral aneurysm   . Myalgia and myositis 02/25/2012  . TORTICOLLIS 06/10/2010  . PAIN IN JOINT, MULTIPLE SITES 03/27/2010  . FREQUENCY, URINARY 03/01/2010  . HIP PAIN, LEFT 01/28/2010  . LIVER HEMANGIOMA 06/07/2009  . SHOULDER PAIN, RIGHT 10/31/2008  .  DYSMENORRHEA 10/12/2008  . HEADACHE, TENSION 05/11/2008  . ALLERGIC RHINITIS 01/05/2008  . OVARIAN CYST, LEFT 11/03/2007  . OBESITY 06/04/2007  . ANXIETY 03/04/2007  . DEPRESSION 03/04/2007  . GERD 03/04/2007  . NEPHROLITHIASIS, HX OF 03/04/2007    Past Surgical History:  Procedure Laterality Date  . CEREBRAL ANGIOGRAM    . CEREBRAL EMBOLIZATION  dec 2013  . DILITATION & CURRETTAGE/HYSTROSCOPY WITH NOVASURE ABLATION N/A 08/04/2013   Procedure: DILATATION & CURETTAGE/HYSTEROSCOPY WITH NOVASURE ABLATION;  Surgeon: Annalee Genta, DO;  Location: [facility];  Service: Gynecology;  Laterality: N/A;  . IR GENERIC HISTORICAL  11/30/2015   IR ANGIO INTRA EXTRACRAN SEL INTERNAL CAROTID BILAT MOD SED 11/30/2015 Consuella Lose, MD [facility]INTER[facility]  . IR GENERIC HISTORICAL  11/30/2015   IR ANGIO VERTEBRAL SEL VERTEBRAL BILAT MOD SED 11/30/2015 Consuella Lose, MD [facility]  . radiation brain    . TUBAL LIGATION    . WISDOM TOOTH EXTRACTION  2001    OB History    Gravida Para Term Preterm AB Living   3 2 2   1 2    SAB TAB Ectopic Multiple Live Births     1             Home Medications    Prior to Admission medications   Medication Sig Start Date End Date Taking? Authorizing Provider  albuterol (PROVENTIL HFA;VENTOLIN HFA) 108 (90 Base) MCG/ACT inhaler Inhale 2 puffs into the lungs every 4 (four) hours as needed for wheezing or shortness of breath. 04/17/16  Yes Triplett, Cari B, FNP  amphetamine-dextroamphetamine (ADDERALL) 20 MG tablet TAKE 40 mg TABLET TWICE A DAY  (8AM AND NOON) 11/28/16  Yes [provider]  Aspirin-Acetaminophen-Caffeine (GOODY HEADACHE PO) Take by mouth daily as needed.    Yes [provider]  cetirizine (ZYRTEC) 10 MG tablet Take 10 mg by mouth daily.   Yes [provider]  clonazePAM (KLONOPIN) 0.5 MG tablet TAKE 2 TABLET 1-2 TIMES DAILY AS NEEDED FOR SEVERE ANXIETY (AVOID REGULAR USE) ORALLY 30 DAYS 11/15/16  Yes [provider]    docusate sodium (COLACE) 100 MG capsule Take 100 mg by mouth daily as needed for mild constipation.   Yes [provider]  EPINEPHrine (EPIPEN 2-PAK) 0.3 mg/0.3 mL IJ SOAJ injection Inject 0.3 mLs (0.3 mg total) into the muscle once. 02/11/15  Yes Lisa Roca, MD  HYDROcodone-acetaminophen ([place]) 7.5-325 MG per tablet Take 1 tablet by mouth 3 (three) times daily as needed for moderate pain.  02/21/14  Yes [provider]  methocarbamol (ROBAXIN) 750 MG tablet Take 1500 mg by mouth once daily as needed for muscle spasms 11/21/14  Yes [provider]  montelukast (SINGULAIR) 10 MG tablet Take 10 mg by mouth at bedtime.    Yes [provider]  Multiple Vitamin (MULTIVITAMIN) capsule Take 1 capsule by mouth daily. Woman"s one a day   Yes [provider]  PARoxetine (PAXIL) 40 MG tablet Take 40 mg by mouth daily 12/18/14  Yes [provider]  topiramate (TOPAMAX) 100 MG tablet Take 2 tablets twice a day Patient taking differently: Take 200 mg by mouth 2 (two) times daily. Take 2 tablets twice a day 06/19/16  Yes Cameron Sprang, MD  VIMPAT 100 MG TABS TAKE 2 TABLETS BY MOUTH TWO TIMES DAILY Patient taking differently: TAKE 200 mg TABLETS BY MOUTH TWO TIMES DAILY 09/29/16  Yes Cameron Sprang, MD  ranitidine (ZANTAC) 150 MG tablet Take 1 tablet (150 mg total) by mouth 2 (two) times daily. Patient not taking: Reported on 01/22/2016 02/03/15 02/03/16  Johnn Hai, PA-C  vitamin B-12 (CYANOCOBALAMIN) 100 MCG tablet Take 100 mcg by mouth daily.    [provider]    Family History Family History  Problem Relation Age of Onset  . Bipolar disorder Mother   . Heart failure Mother   . Aneurysm Father   . Thyroid disease Maternal Grandmother   . Glaucoma Maternal Grandmother     Social History Social History  Substance Use Topics  . Smoking status: Former Research scientist (life sciences)  . Smokeless tobacco: Never Used     Comment: Quit 2007  . Alcohol use  0.6 oz/week    1 Cans of beer per week     Comment: occasional     Allergies   Patient has no known allergies.   Review of Systems Review of Systems  Constitutional: Negative for appetite change and fever.  HENT: Negative for congestion.   Eyes: Negative for photophobia.  Respiratory: Positive for shortness of breath.   Cardiovascular: Positive for chest pain.  Gastrointestinal: Positive for nausea. Negative for abdominal pain.  Endocrine: Negative for polyuria.  Genitourinary: Negative for flank pain.  Musculoskeletal: Positive for neck pain.  Skin: Negative for rash.  Neurological: Negative for weakness.  Hematological: Negative for adenopathy.  Psychiatric/Behavioral: Negative for confusion.     Physical Exam Updated Vital Signs BP 111/85   Pulse 77   Temp 97.9 F (36.6 C) (Oral)   Resp 17   SpO2 98%  Physical Exam  Constitutional: She appears well-developed.  HENT:  Head: Normocephalic.  Right Ear: External ear normal.  Eyes: Pupils are equal, round, and reactive to light.  Neck: Neck supple.  Cardiovascular:  Mild tachycardia  Pulmonary/Chest: No respiratory distress. She has no wheezes.  Abdominal: Soft. There is no tenderness.  Musculoskeletal: She exhibits no edema.  Neurological: She is alert.  Skin: Skin is warm. Capillary refill takes less than 2 seconds.  Psychiatric: She has a normal mood and affect.     ED Treatments / Results  Labs (all labs ordered are listed, but only abnormal results are displayed) Labs Reviewed  BASIC METABOLIC PANEL - Abnormal; Notable for the following:       Result Value   Chloride 112 (*)    CO2 17 (*)    All other components within normal limits  CBC - Abnormal; Notable for the following:    Hemoglobin 15.2 (*)    All other components within normal limits  D-DIMER, QUANTITATIVE (NOT AT [facility])  I-STAT TROPONIN, ED    EKG  EKG Interpretation  Date/Time:  Tuesday January 06 2017 13:05:06 EDT Ventricular  Rate:  126 PR Interval:  142 QRS Duration: 78 QT Interval:  310 QTC Calculation: 448 R Axis:   66 Text Interpretation:  Sinus tachycardia Biatrial enlargement Abnormal ECG rate increased from last tracing, but otherwise simmilar. Reconfirmed by Davonna Belling [phone]) on 01/06/2017 4:31:28 PM       Radiology Dg Chest 2 View  Result Date: 01/06/2017 CLINICAL DATA:  Chest pain. EXAM: CHEST  2 VIEW COMPARISON:  None. FINDINGS: The cardiomediastinal silhouette is normal in size. Normal pulmonary vascularity. No focal consolidation, pleural effusion, or pneumothorax. No acute osseous abnormality. IMPRESSION: No active cardiopulmonary disease. Electronically Signed   By: Titus Dubin M.D.   On: 01/06/2017 13:25    Procedures Procedures (including critical care time)  Medications Ordered in ED Medications  sodium chloride 0.9 % bolus 500 mL (500 mLs Intravenous New Bag/Given 01/06/17 1713)  HYDROcodone-acetaminophen (NORCO/VICODIN) 5-325 MG per tablet 1 tablet (1 tablet Oral Given 01/06/17 1728)     Initial Impression / Assessment and Plan / ED Course  I have reviewed the triage vital signs and the nursing notes.  Pertinent labs & imaging results that were available during my care of the patient were reviewed by me and considered in my medical decision making (see chart for details).     Patient with chest pain. Had been told by primary care doctor that she's had a previous MI there are some small Q waves inferiorly but stable from previous EKGs. Troponin negative. Lab workup reassuring. Initial tachycardia has resolved. D-dimer negative. Discharge home with follow-up with primary care doctor and cardiology as planned.  Final Clinical Impressions(s) / ED Diagnoses   Final diagnoses:  Chest pain, unspecified type  Dehydration    New Prescriptions New Prescriptions   No medications on file     Davonna Belling, MD 01/06/17 [phone]

## 2017-01-06 NOTE — ED Triage Notes (Addendum)
To ED for eval of left chest pain that started yesterday. She was seen by PMD yesterday Velora Heckler) and had ECG and blood work done. Pt states they told her she had a 'silent heart attack and had blockages' but wanted her to follow up on Friday with cardiology in [place]. Pt states she has numbness to both sides of face. Nausea. No vomiting. Also complains of HA

## 2017-01-07 DIAGNOSIS — G43909 Migraine, unspecified, not intractable, without status migrainosus: Secondary | ICD-10-CM | POA: Insufficient documentation

## 2017-01-07 DIAGNOSIS — I10 Essential (primary) hypertension: Secondary | ICD-10-CM | POA: Insufficient documentation

## 2017-01-07 DIAGNOSIS — D1803 Hemangioma of intra-abdominal structures: Secondary | ICD-10-CM | POA: Insufficient documentation

## 2017-01-09 ENCOUNTER — Ambulatory Visit (HOSPITAL_BASED_OUTPATIENT_CLINIC_OR_DEPARTMENT_OTHER)
Admission: RE | Admit: 2017-01-09 | Discharge: 2017-01-09 | Disposition: A | Discharge status: Home / Self Care | Payer: Medicaid Other | Source: Ambulatory Visit | Attending: Cardiology | Admitting: Cardiology

## 2017-01-09 ENCOUNTER — Ambulatory Visit (INDEPENDENT_AMBULATORY_CARE_PROVIDER_SITE_OTHER): Payer: Medicaid Other | Admitting: Cardiology

## 2017-01-09 ENCOUNTER — Telehealth (HOSPITAL_COMMUNITY): Payer: Self-pay | Admitting: *Deleted

## 2017-01-09 ENCOUNTER — Encounter: Payer: Self-pay | Admitting: Cardiology

## 2017-01-09 VITALS — BP 110/88 | HR 120 | Ht 61.75 in | Wt 157.0 lb

## 2017-01-09 DIAGNOSIS — E786 Lipoprotein deficiency: Secondary | ICD-10-CM | POA: Insufficient documentation

## 2017-01-09 DIAGNOSIS — I10 Essential (primary) hypertension: Secondary | ICD-10-CM

## 2017-01-09 DIAGNOSIS — E785 Hyperlipidemia, unspecified: Secondary | ICD-10-CM | POA: Diagnosis not present

## 2017-01-09 DIAGNOSIS — R079 Chest pain, unspecified: Secondary | ICD-10-CM | POA: Diagnosis not present

## 2017-01-09 DIAGNOSIS — R9431 Abnormal electrocardiogram [ECG] [EKG]: Secondary | ICD-10-CM | POA: Insufficient documentation

## 2017-01-09 MED ORDER — CELECOXIB 200 MG PO CAPS: 200.0000 mg | ORAL_CAPSULE | Freq: Two times a day (BID) | ORAL | 1 refills | Status: AC

## 2017-01-09 NOTE — Telephone Encounter (Signed)
Patient given detailed instructions per Myocardial Perfusion Study Information Sheet for the test on 01/12/17 at 7:45. Patient notified to arrive 15 minutes early and that it is imperative to arrive on time for appointment to keep from having the test rescheduled.  If you need to cancel or reschedule your appointment, please call the office within 24 hours of your appointment. . Patient verbalized understanding.Katie [name]

## 2017-01-09 NOTE — Progress Notes (Signed)
Cardiology Office Note:    Date:  01/09/2017   ID:  Katie [name], DOB 09-Jan-1976, MRN [id number]  PCP:  Maurice Small, MD  Cardiologist:  Shirlee More, MD   Referring MD: Maurice Small, MD  ASSESSMENT:    1. Abnormal EKG   2. Chest pain, unspecified type   3. Essential hypertension   4. Low HDL (under 40)   5. Dyslipidemia    PLAN:    In order of problems listed above:  1. EKG is borderline not diagnostic of myocardial infarction. 2. Atypical most consistent with costochondral pain. She'll take a prescription nonsteroidal anti-inflammatory drugand will have further evaluation including sedimentation rate for pleuritis D dimer for venous thromboembolism and myocardial perfusion imaging with abnormal EKG and chest pain. I'll see her back in my office in several weeks to assess her response. 3. Stable at present does not require treatment 4. Stable at present not on lipid lowering therapy 5. Stable at present not on lipid lowering therapy  Next appointment 2 weeks   Medication Adjustments/Labs and Tests Ordered: Current medicines are reviewed at length with the patient today.  Concerns regarding medicines are outlined above.  Orders Placed This Encounter  Procedures  . DG Chest 2 View  . Sed Rate (ESR)  . D-Dimer, Quantitative  . MYOCARDIAL PERFUSION IMAGING  . EKG 12-Lead   Meds ordered this encounter  Medications  . celecoxib (CELEBREX) 200 MG capsule    Sig: Take 1 capsule (200 mg total) by mouth 2 (two) times daily.    Dispense:  30 capsule    Refill:  1     Chief Complaint  Patient presents with  . New Patient (Initial Visit)  . Shortness of Breath  . Edema    History of Present Illness:    Katie [name] is a 41 y.o. female with a history of hypertension, hyperlipidemia and AVM who is being seen today for the evaluation of chest discomfort and an abnormal EKG at the request of Maurice Small, MD. She presently is on no meds for BP or lipids. She has had  intermittent non exertional chest pain since Monday.  She describes chest pain is localized left sternal are both pressure as well as sharp worse with a deep breath and short of breath with a deep breath. Otherwise would not having pain she has no dyspnea symptoms are not exertional they last for a few minutes and occur several times a day and are unrelieved with rest. There is been no cough sputum fever chills. She has no history of venous thromboembolism. There is no radiation. Symptoms are not GI in nature. And she describes as mild and at times severe but generally nagging. Past Medical History:  Diagnosis Date  . ALLERGIC RHINITIS 01/05/2008   Qualifier: Diagnosis of  By: Hassell Done FNP, Tori Milks    . Anxiety   . AVM (arteriovenous malformation)   . AVM (arteriovenous malformation) brain 02/22/2013  . Cerebral aneurysm   . Chronic migraine 01/25/2015   Botox approved diagnosis.   . Depression   . Dysmenorrhea 10/12/2008   Qualifier: Diagnosis of  By: Amil Amen MD, Benjamine Mola    . Fibromyalgia   . FREQUENCY, URINARY 03/01/2010   Qualifier: Diagnosis of  By: Keenan Bachelor RN, Melinda    . Frequent headaches 06/03/2013  . GERD (gastroesophageal reflux disease) 03/04/2007   Qualifier: Diagnosis of  By: Hassell Done FNP, Tori Milks    . Headache   . HEADACHE, TENSION 05/11/2008   Qualifier: Diagnosis of  By: Hassell Done  FNP, Tori Milks    . HIP PAIN, LEFT 01/28/2010   Qualifier: Diagnosis of  By: Hassell Done FNP, Tori Milks    . Hypertension   . Left-sided weakness 03/08/2014  . Liver hemangioma   . LIVER HEMANGIOMA 06/07/2009   Annotation: MRI 02/19/2010 - Several tiny less than 1 cm benign hepatic  hemangiomas.  One of these adjacent to the gallbladder fossa corresponds to  the lesionseen on prior ultrasound. 2.  No clinically significant abnormality identified. Qualifier: Diagnosis of  By: Isla Pence    . Localization-related symptomatic epilepsy and epileptic syndromes with complex partial seizures, not intractable,  without status epilepticus ([place]) 01/22/2016  . Migraine   . Migraine without aura and without status migrainosus, not intractable 06/26/2016  . Myalgia and myositis 02/25/2012  . NEPHROLITHIASIS, HX OF 03/04/2007   Annotation: 2006 Qualifier: Diagnosis of  By: Hassell Done FNP, Tori Milks    . OBESITY 06/04/2007   Qualifier: Diagnosis of  By: Hassell Done FNP, Tori Milks    . OVARIAN CYST, LEFT 11/03/2007   Qualifier: Diagnosis of  By: Hassell Done FNP, Tori Milks    . Pain in joint, multiple sites 03/27/2010   Qualifier: Diagnosis of  By: Hassell Done FNP, Tori Milks    . 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 Seizures ([place])   . SHOULDER PAIN, RIGHT 10/31/2008   Qualifier: Diagnosis of  By: Jorene Minors, Scott    . TORTICOLLIS 06/10/2010   Qualifier: Diagnosis of  By: Hassell Done FNP, Tori Milks    . Transient alteration of awareness 01/22/2016    Past Surgical History:  Procedure Laterality Date  . CEREBRAL ANGIOGRAM    . CEREBRAL EMBOLIZATION  dec 2013  . DILITATION & CURRETTAGE/HYSTROSCOPY WITH NOVASURE ABLATION N/A 08/04/2013   Procedure: DILATATION & CURETTAGE/HYSTEROSCOPY WITH NOVASURE ABLATION;  Surgeon: Annalee Genta, DO;  Location: [facility];  Service: Gynecology;  Laterality: N/A;  . IR GENERIC HISTORICAL  11/30/2015   IR ANGIO INTRA EXTRACRAN SEL INTERNAL CAROTID BILAT MOD SED 11/30/2015 Consuella Lose, MD [facility]  . IR GENERIC HISTORICAL  11/30/2015   IR ANGIO VERTEBRAL SEL VERTEBRAL BILAT MOD SED 11/30/2015 Consuella Lose, MD [facility]  . radiation brain    . TUBAL LIGATION    . WISDOM TOOTH EXTRACTION  2001    Current Medications: Current Meds  Medication Sig  . albuterol (PROVENTIL HFA;VENTOLIN HFA) 108 (90 Base) MCG/ACT inhaler Inhale 2 puffs into the lungs every 4 (four) hours as needed for wheezing or shortness of breath.  . amphetamine-dextroamphetamine (ADDERALL) 20 MG tablet TAKE 40 mg TABLET TWICE A DAY  (8AM AND NOON)  . Aspirin-Acetaminophen-Caffeine (GOODY HEADACHE PO) Take by mouth  daily as needed.   . cetirizine (ZYRTEC) 10 MG tablet Take 10 mg by mouth daily.  . clonazePAM (KLONOPIN) 0.5 MG tablet TAKE 2 TABLET 1-2 TIMES DAILY AS NEEDED FOR SEVERE ANXIETY (AVOID REGULAR USE) ORALLY 30 DAYS  . docusate sodium (COLACE) 100 MG capsule Take 100 mg by mouth daily as needed for mild constipation.  Marland Kitchen EPINEPHrine (EPIPEN 2-PAK) 0.3 mg/0.3 mL IJ SOAJ injection Inject 0.3 mLs (0.3 mg total) into the muscle once.  Marland Kitchen HYDROcodone-acetaminophen (NORCO) 7.5-325 MG per tablet Take 1 tablet by mouth 3 (three) times daily as needed for moderate pain.   . methocarbamol (ROBAXIN) 750 MG tablet Take 1500 mg by mouth once daily as needed for muscle spasms  . montelukast (SINGULAIR) 10 MG tablet Take 10 mg by mouth at bedtime.   . Multiple Vitamin (MULTIVITAMIN) capsule Take 1 capsule by mouth daily.  Woman"s one a day  . PARoxetine (PAXIL) 40 MG tablet Take 40 mg by mouth daily  . topiramate (TOPAMAX) 100 MG tablet Take 2 tablets twice a day (Patient taking differently: Take 200 mg by mouth 2 (two) times daily. Take 2 tablets twice a day)  . vitamin B-12 (CYANOCOBALAMIN) 100 MCG tablet Take 100 mcg by mouth daily.     Allergies:   Patient has no known allergies.   Social History   Social History  . Marital status: Married    Spouse name: [name]  . Number of children: 2  . Years of education: college   Occupational History  .  [employer]    Disabled   Social History Main Topics  . Smoking status: Current Every Day Smoker  . Smokeless tobacco: Never Used     Comment: Quit 2007  . Alcohol use 0.6 oz/week    1 Cans of beer per week     Comment: occasional  . Drug use: No  . Sexual activity: Yes    Birth control/ protection: Surgical   Other Topics Concern  . None   Social History Narrative   Patient lives at home with her husband [name])    Disabled.   Education college   Both handed   Caffeine two per week           Family History: The patient's family history  includes Aneurysm in her father; Bipolar disorder in her mother; Glaucoma in her maternal grandmother; Heart failure in her mother; Thyroid disease in her maternal grandmother.  ROS:   Review of Systems  Constitution: Negative.  HENT: Negative.   Eyes: Negative.   Cardiovascular: Positive for chest pain. Negative for claudication, cyanosis, dyspnea on exertion, irregular heartbeat, leg swelling, near-syncope, orthopnea, palpitations, paroxysmal nocturnal dyspnea and syncope.  Respiratory: Positive for shortness of breath (with chest pain).   Endocrine: Negative.   Skin: Negative.   Musculoskeletal: Negative.   Gastrointestinal: Negative.   Genitourinary: Negative.   Neurological: Positive for difficulty with concentration and headaches. Brief paralysis: she describes chest pain as both sharp.  Psychiatric/Behavioral: Negative.    Please see the history of present illness.     All other systems reviewed and are negative.  EKGs/Labs/Other Studies Reviewed:    The following studies were reviewed today:  EKG 01/05/17: [place] insignificant inferior Q waves, poor R wave progression, borderline EKG EKG 03/04/12: 12 Lead EKG-Portable - Adult Palpitations 12 Lead EKG-Portable - Adult Palpitations  Narrative Performed At  Ventricular Rate 72BPM   Atrial Rate72BPM   P-R Interval 158 ms  QRS Duration 67m  Q-T Interval 408 ms  QTC446 ms  P Axis 26degrees   R Axis 18degrees   T Axis 17degrees     Sinus rhythm    Cannot exclude Inferior infarct    Poor  progress of the R waves across the precordium        When compared with ECG of 25-Feb-2012 11:25,    No significant change was found    Clinical correlation required    Confirmed by [facility] R. ([id number]) on 03/04/2012 9:48:18 AM       MUSE    12 Lead EKG-Portable - Adult Palpitations  Procedure Note  Interface, External Ris In - 03/04/2012 9:48 AM EST  Ventricular Rate 72 BPM  Atrial Rate 72 BPM  P-R Interval 158 ms  QRS Duration 88 ms  Q-T Interval 408 ms  QTC 446 ms  P Axis 26 degrees  R Axis 18 degrees  T Axis 17 degrees   Sinus rhythm  Cannot exclude Inferior infarct  Poor progress of the R waves across the precordium    When compared with ECG of 25-Feb-2012 11:25,  No significant change was found      EKG:  EKG is  ordered today.  The ekg ordered today demonstrates same pattern of insignificant inferior Q waves and poor R wave progression not diagnostic of myocardial infarction  Recent Labs: 01/06/2017: BUN 8; Creatinine, Ser 0.68; Hemoglobin 15.2; Platelets 229; Potassium 3.5; Sodium 137  Recent Lipid Panel    Component Value Date/Time   CHOL 150 07/17/2009 2202   TRIG 240 (H) 07/17/2009 2202   HDL 29 (L) 07/17/2009 2202   CHOLHDL 5.2 Ratio 07/17/2009 2202   VLDL 48 (H) 07/17/2009 2202   LDLCALC 73 07/17/2009 2202    Physical Exam:    VS:  BP 110/88 (BP Location: Left Arm, Patient Position: Sitting, Cuff Size: Normal)   Pulse (!) 120   Ht 5' 1.75" (1.568 m)   Wt 157 lb (71.2 kg)   SpO2 98%   BMI 28.95 kg/m     Wt Readings from Last 3 Encounters:  01/09/17 157 lb (71.2 kg)  12/22/16 162 lb 3.2 oz (73.6 kg)  06/19/16 181 lb 1.6 oz (82.1 kg)     GEN:  Well nourished, well developed in no acute distress HEENT: Normal NECK: No JVD; No carotid bruits LYMPHATICS: No lymphadenopathy CARDIAC: RRR, no murmurs, rubs, gallops tender L CCJ reproducing her complaint RESPIRATORY:  Clear to auscultation without rales, wheezing or  rhonchi  ABDOMEN: Soft, non-tender, non-distended MUSCULOSKELETAL:  No edema; No deformity  SKIN: Warm and dry NEUROLOGIC:  Alert and oriented x 3 PSYCHIATRIC:  Normal affect     Signed, Shirlee More, MD  01/09/2017 1:37 PM    [facility]

## 2017-01-09 NOTE — Patient Instructions (Addendum)
Medication Instructions:  Your physician has recommended you make the following change in your medication:  START celocoxib (Celebrex) 200 mg twice daily  Labwork: Your physician recommends that you return for lab work in: today. D-dimer, sed rate  Testing/Procedures: You had an EKG today.  A chest x-ray takes a picture of the organs and structures inside the chest, including the heart, lungs, and blood vessels. This test can show several things, including, whether the heart is enlarges; whether fluid is building up in the lungs; and whether pacemaker / defibrillator leads are still in place.  Your physician has requested that you have en exercise stress myoview. For further information please visit [email or web address]. Please follow instruction sheet, as given.  Follow-Up: Your physician recommends that you schedule a follow-up appointment in: 2 weeks.  Any Other Special Instructions Will Be Listed Below (If Applicable).     If you need a refill on your cardiac medications before your next appointment, please call your pharmacy.    Costochondritis Costochondritis is swelling and irritation (inflammation) of the tissue (cartilage) that connects your ribs to your breastbone (sternum). This causes pain in the front of your chest. The pain usually starts gradually and involves more than one rib. What are the causes? The exact cause of this condition is not always known. It results from stress on the cartilage where your ribs attach to your sternum. The cause of this stress could be:  Chest injury (trauma).  Exercise or activity, such as lifting.  Severe coughing.  What increases the risk? You may be at higher risk for this condition if you:  Are female.  Are 58?39 years old.  Recently started a new exercise or work activity.  Have low levels of vitamin D.  Have a condition that makes you cough frequently.  What are the signs or symptoms? The main symptom of this  condition is chest pain. The pain:  Usually starts gradually and can be sharp or dull.  Gets worse with deep breathing, coughing, or exercise.  Gets better with rest.  May be worse when you press on the sternum-rib connection (tenderness).  How is this diagnosed? This condition is diagnosed based on your symptoms, medical history, and a physical exam. Your health care provider will check for tenderness when pressing on your sternum. This is the most important finding. You may also have tests to rule out other causes of chest pain. These may include:  A chest X-ray to check for lung problems.  An electrocardiogram (ECG) to see if you have a heart problem that could be causing the pain.  An imaging scan to rule out a chest or rib fracture.  How is this treated? This condition usually goes away on its own over time. Your health care provider may prescribe an NSAID to reduce pain and inflammation. Your health care provider may also suggest that you:  Rest and avoid activities that make pain worse.  Apply heat or cold to the area to reduce pain and inflammation.  Do exercises to stretch your chest muscles.  If these treatments do not help, your health care provider may inject a numbing medicine at the sternum-rib connection to help relieve the pain. Follow these instructions at home:  Avoid activities that make pain worse. This includes any activities that use chest, abdominal, and side muscles.  If directed, put ice on the painful area: ? Put ice in a plastic bag. ? Place a towel between your skin and the bag. ? Leave the  ice on for 20 minutes, 2-3 times a day.  If directed, apply heat to the affected area as often as told by your health care provider. Use the heat source that your health care provider recommends, such as a moist heat pack or a heating pad. ? Place a towel between your skin and the heat source. ? Leave the heat on for 20-30 minutes. ? Remove the heat if your skin  turns bright red. This is especially important if you are unable to feel pain, heat, or cold. You may have a greater risk of getting burned.  Take over-the-counter and prescription medicines only as told by your health care provider.  Return to your normal activities as told by your health care provider. Ask your health care provider what activities are safe for you.  Keep all follow-up visits as told by your health care provider. This is important. Contact a health care provider if:  You have chills or a fever.  Your pain does not go away or it gets worse.  You have a cough that does not go away (is persistent). Get help right away if:  You have shortness of breath. This information is not intended to replace advice given to you by your health care provider. Make sure you discuss any questions you have with your health care provider. Document Released: 12/18/2004 Document Revised: 09/28/2015 Document Reviewed: 07/04/2015 Elsevier Interactive Patient Education  [employer].

## 2017-01-10 LAB — D-DIMER, QUANTITATIVE (NOT AT ARMC): D-DIMER: 0.31 mg{FEU}/L (ref 0.00–0.49)

## 2017-01-10 LAB — SEDIMENTATION RATE: Sed Rate: 2 mm/hr (ref 0–32)

## 2017-01-12 ENCOUNTER — Ambulatory Visit (HOSPITAL_COMMUNITY): Payer: Medicaid Other | Attending: Cardiology

## 2017-01-12 ENCOUNTER — Ambulatory Visit: Payer: BLUE CROSS/BLUE SHIELD | Admitting: Neurology

## 2017-01-15 ENCOUNTER — Telehealth (HOSPITAL_COMMUNITY): Payer: Self-pay | Admitting: Cardiology

## 2017-01-21 NOTE — Telephone Encounter (Signed)
User: Cherie Dark A Date/time: 01/16/17 2:33 PM  Comment: Called pt and lmsg for her to CB to r/s her myoview.   Context:  Outcome: Left Message  Phone number: [phone] Phone Type: Mobile  Comm. type: Telephone Call type: Outgoing  Contact: [name] Relation to patient: Self    User: Cherie Dark A Date/time: 01/15/17 2:28 PM  Comment: Called pt and lmsg for her to CB to r/s myoview this was missed on 10/22.Marland KitchenRG  Context:  Outcome: Left Message  Phone number: [phone] Phone Type: Mobile  Comm. type: Telephone Call type: Outgoing  Contact: [name] Relation to patient: Self

## 2017-01-26 NOTE — Progress Notes (Deleted)
Cardiology Office Note:    Date:  01/26/2017   ID:  Katie [name], DOB 09-22-1975, MRN [id number]  PCP:  Maurice Small, MD  Cardiologist:  Shirlee More, MD    Referring MD: Maurice Small, MD    ASSESSMENT:    No diagnosis found. PLAN:    In order of problems listed above:  1. ***   Next appointment: ***   Medication Adjustments/Labs and Tests Ordered: Current medicines are reviewed at length with the patient today.  Concerns regarding medicines are outlined above.  No orders of the defined types were placed in this encounter.  No orders of the defined types were placed in this encounter.   No chief complaint on file.   History of Present Illness:   = Katie [name] is a 41 y.o. female with a hx of hypertension, hyperlipidemia AVM chest discomfort and an abnormal EKG last seen 2 weeks ago. Compliance with diet, lifestyle and medications: *** Past Medical History:  Diagnosis Date  . ALLERGIC RHINITIS 01/05/2008   Qualifier: Diagnosis of  By: Hassell Done FNP, Tori Milks    . Anxiety   . AVM (arteriovenous malformation)   . AVM (arteriovenous malformation) brain 02/22/2013  . Cerebral aneurysm   . Chronic migraine 01/25/2015   Botox approved diagnosis.   . Depression   . Dysmenorrhea 10/12/2008   Qualifier: Diagnosis of  By: Amil Amen MD, Benjamine Mola    . Fibromyalgia   . FREQUENCY, URINARY 03/01/2010   Qualifier: Diagnosis of  By: Keenan Bachelor RN, Melinda    . Frequent headaches 06/03/2013  . GERD (gastroesophageal reflux disease) 03/04/2007   Qualifier: Diagnosis of  By: Hassell Done FNP, Tori Milks    . Headache   . HEADACHE, TENSION 05/11/2008   Qualifier: Diagnosis of  By: Hassell Done FNP, Tori Milks    . HIP PAIN, LEFT 01/28/2010   Qualifier: Diagnosis of  By: Hassell Done FNP, Tori Milks    . Hypertension   . Left-sided weakness 03/08/2014  . Liver hemangioma   . LIVER HEMANGIOMA 06/07/2009   Annotation: MRI 02/19/2010 - Several tiny less than 1 cm benign hepatic  hemangiomas.  One of these adjacent  to the gallbladder fossa corresponds to  the lesionseen on prior ultrasound. 2.  No clinically significant abnormality identified. Qualifier: Diagnosis of  By: Isla Pence    . Localization-related symptomatic epilepsy and epileptic syndromes with complex partial seizures, not intractable, without status epilepticus ([place]) 01/22/2016  . Migraine   . Migraine without aura and without status migrainosus, not intractable 06/26/2016  . Myalgia and myositis 02/25/2012  . NEPHROLITHIASIS, HX OF 03/04/2007   Annotation: 2006 Qualifier: Diagnosis of  By: Hassell Done FNP, Tori Milks    . OBESITY 06/04/2007   Qualifier: Diagnosis of  By: Hassell Done FNP, Tori Milks    . OVARIAN CYST, LEFT 11/03/2007   Qualifier: Diagnosis of  By: Hassell Done FNP, Tori Milks    . Pain in joint, multiple sites 03/27/2010   Qualifier: Diagnosis of  By: Hassell Done FNP, Tori Milks    . 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 Seizures ([place])   . SHOULDER PAIN, RIGHT 10/31/2008   Qualifier: Diagnosis of  By: Jorene Minors, Scott    . TORTICOLLIS 06/10/2010   Qualifier: Diagnosis of  By: Hassell Done FNP, Tori Milks    . Transient alteration of awareness 01/22/2016    Past Surgical History:  Procedure Laterality Date  . CEREBRAL ANGIOGRAM    . CEREBRAL EMBOLIZATION  dec 2013  . IR GENERIC HISTORICAL  11/30/2015   IR ANGIO INTRA EXTRACRAN SEL INTERNAL CAROTID BILAT MOD  SED 11/30/2015 Consuella Lose, MD [facility]  . IR GENERIC HISTORICAL  11/30/2015   IR ANGIO VERTEBRAL SEL VERTEBRAL BILAT MOD SED 11/30/2015 Consuella Lose, MD [facility]  . radiation brain    . TUBAL LIGATION    . WISDOM TOOTH EXTRACTION  2001    Current Medications: No outpatient medications have been marked as taking for the 01/27/17 encounter (Appointment) with Richardo Priest, MD.     Allergies:   Patient has no known allergies.   Social History   Socioeconomic History  . Marital status: Married    Spouse name: Katie [name]  . Number of children: 2  . Years of education:  college  . Highest education level: Not on file  Social Needs  . Financial resource strain: Not on file  . Food insecurity - worry: Not on file  . Food insecurity - inability: Not on file  . Transportation needs - medical: Not on file  . Transportation needs - non-medical: Not on file  Occupational History    Employer: [employer]    Comment: Disabled  Tobacco Use  . Smoking status: Current Every Day Smoker  . Smokeless tobacco: Never Used  . Tobacco comment: Quit 2007  Substance and Sexual Activity  . Alcohol use: Yes    Alcohol/week: 0.6 oz    Types: 1 Cans of beer per week    Comment: occasional  . Drug use: No  . Sexual activity: Yes    Birth control/protection: Surgical  Other Topics Concern  . Not on file  Social History Narrative   Patient lives at home with her husband [name])    Disabled.   Education college   Both handed   Caffeine two per week        Family History: The patient's ***family history includes Aneurysm in her father; Bipolar disorder in her mother; Glaucoma in her maternal grandmother; Heart failure in her mother; Thyroid disease in her maternal grandmother. ROS:   Please see the history of present illness.    All other systems reviewed and are negative.  EKGs/Labs/Other Studies Reviewed:    The following studies were reviewed today:  EKG:  EKG ordered today.  The ekg ordered today demonstrates ***  Recent Labs: D-DIMER 0.00 - 0.49 mg/L FEU 0.31    Sed Rate 0 - 32 mm/hr 2      01/06/2017: BUN 8; Creatinine, Ser 0.68; Hemoglobin 15.2; Platelets 229; Potassium 3.5; Sodium 137  Recent Lipid Panel    Component Value Date/Time   CHOL 150 07/17/2009 2202   TRIG 240 (H) 07/17/2009 2202   HDL 29 (L) 07/17/2009 2202   CHOLHDL 5.2 Ratio 07/17/2009 2202   VLDL 48 (H) 07/17/2009 2202   LDLCALC 73 07/17/2009 2202    Physical Exam:    VS:  There were no vitals taken for this visit.    Wt Readings from Last 3 Encounters:  01/09/17 157  lb (71.2 kg)  12/22/16 162 lb 3.2 oz (73.6 kg)  06/19/16 181 lb 1.6 oz (82.1 kg)     GEN: *** Well nourished, well developed in no acute distress HEENT: Normal NECK: No JVD; No carotid bruits LYMPHATICS: No lymphadenopathy CARDIAC: ***RRR, no murmurs, rubs, gallops RESPIRATORY:  Clear to auscultation without rales, wheezing or rhonchi  ABDOMEN: Soft, non-tender, non-distended MUSCULOSKELETAL:  No edema; No deformity  SKIN: Warm and dry NEUROLOGIC:  Alert and oriented x 3 PSYCHIATRIC:  Normal affect    Signed, Shirlee More, MD  01/26/2017 12:50 PM  [facility]

## 2017-01-27 ENCOUNTER — Ambulatory Visit: Payer: Medicaid Other | Admitting: Cardiology

## 2017-01-27 ENCOUNTER — Telehealth (HOSPITAL_COMMUNITY): Payer: Self-pay | Admitting: *Deleted

## 2017-01-27 NOTE — Telephone Encounter (Signed)
Left message on voicemail per DPR in reference to upcoming appointment scheduled on 02/02/17 with detailed instructions given per Myocardial Perfusion Study Information Sheet for the test. LM to arrive 15 minutes early, and that it is imperative to arrive on time for appointment to keep from having the test rescheduled. If you need to cancel or reschedule your appointment, please call the office within 24 hours of your appointment. Failure to do so may result in a cancellation of your appointment, and a $50 no show fee. Phone number given for call back for any questions. Kirstie Peri

## 2017-02-02 ENCOUNTER — Encounter (HOSPITAL_COMMUNITY): Payer: Medicaid Other

## 2017-02-26 ENCOUNTER — Other Ambulatory Visit: Payer: Self-pay

## 2017-02-26 ENCOUNTER — Emergency Department: Payer: Medicaid Other

## 2017-02-26 ENCOUNTER — Encounter: Payer: Self-pay | Admitting: Emergency Medicine

## 2017-02-26 ENCOUNTER — Emergency Department
Admission: EM | Admit: 2017-02-26 | Discharge: 2017-02-27 | Disposition: A | Discharge status: Home / Self Care | Payer: Medicaid Other | Attending: Emergency Medicine | Admitting: Emergency Medicine

## 2017-02-26 DIAGNOSIS — I10 Essential (primary) hypertension: Secondary | ICD-10-CM | POA: Insufficient documentation

## 2017-02-26 DIAGNOSIS — R079 Chest pain, unspecified: Secondary | ICD-10-CM

## 2017-02-26 DIAGNOSIS — Z79899 Other long term (current) drug therapy: Secondary | ICD-10-CM | POA: Insufficient documentation

## 2017-02-26 DIAGNOSIS — F17200 Nicotine dependence, unspecified, uncomplicated: Secondary | ICD-10-CM | POA: Diagnosis not present

## 2017-02-26 LAB — BASIC METABOLIC PANEL
Anion gap: 12 (ref 5–15)
BUN: 8 mg/dL (ref 6–20)
CO2: 24 mmol/L (ref 22–32)
CREATININE: 0.73 mg/dL (ref 0.44–1.00)
Calcium: 9.3 mg/dL (ref 8.9–10.3)
Chloride: 102 mmol/L (ref 101–111)
GFR calc Af Amer: >60 mL/min (ref >60)
GLUCOSE: 99 mg/dL (ref 65–99)
POTASSIUM: 3.4 mmol/L — AB (ref 3.5–5.1)
SODIUM: 138 mmol/L (ref 135–145)

## 2017-02-26 LAB — CBC
HEMATOCRIT: 46.1 % (ref 35.0–47.0)
Hemoglobin: 15.7 g/dL (ref 12.0–16.0)
MCH: 32.2 pg (ref 26.0–34.0)
MCHC: 34.1 g/dL (ref 32.0–36.0)
MCV: 94.4 fL (ref 80.0–100.0)
PLATELETS: 248 10*3/uL (ref 150–440)
RBC: 4.88 MIL/uL (ref 3.80–5.20)
RDW: 12.7 % (ref 11.5–14.5)
WBC: 10.2 10*3/uL (ref 3.6–11.0)

## 2017-02-26 LAB — FIBRIN DERIVATIVES D-DIMER (ARMC ONLY): FIBRIN DERIVATIVES D-DIMER (ARMC): 138.04 ng{FEU}/mL (ref 0.00–499.00)

## 2017-02-26 LAB — TROPONIN I: Troponin I: <0.03 ng/mL (ref <0.03)

## 2017-02-26 MED ORDER — GI COCKTAIL ~~LOC~~
30.0000 mL | Freq: Once | ORAL | Status: AC
  Administered 2017-02-26: 30 mL via ORAL
  Filled 2017-02-26: qty 30

## 2017-02-26 MED ORDER — ASPIRIN 81 MG PO CHEW
324.0000 mg | CHEWABLE_TABLET | Freq: Once | ORAL | Status: AC
  Administered 2017-02-26: 324 mg via ORAL
  Filled 2017-02-26: qty 4

## 2017-02-26 NOTE — ED Provider Notes (Signed)
Dg Chest 2 View  Result Date: 02/26/2017 CLINICAL DATA:  Chest heaviness radiating to neck. Shortness of breath. Similar symptoms in the past. EXAM: CHEST  2 VIEW COMPARISON:  Chest radiograph January 09, 2017 FINDINGS: Cardiomediastinal silhouette is normal. No pleural effusions or focal consolidations. Trachea projects midline and there is no pneumothorax. Soft tissue planes and included osseous structures are non-suspicious. ACDF. IMPRESSION: Negative. Electronically Signed   By: Elon Alas M.D.   On: 02/26/2017 21:07     Labs Reviewed  BASIC METABOLIC PANEL - Abnormal; Notable for the following components:      Result Value   Potassium 3.4 (*)    All other components within normal limits  CBC  TROPONIN I  FIBRIN DERIVATIVES D-DIMER [facility] ONLY)  TROPONIN I     Clinical Course as of Feb 27 106  Thu Feb 26, 2017  2311 Assuming care from Dr. Archie Balboa.  In short, Jniya Madara is a 41 y.o. female with a chief complaint of chest pain, SOB, and tachycardia.  Refer to the original H&P for additional details.  The current plan of care is to follow up d-dimer and reassess.   [CF]  2344 D-dimer well within normal limits Fibrin derivatives D-dimer [facility]): 138.04 [CF]  Fri Feb 27, 2017  0105 The patient's second troponin is also normal.  She reports some persistent pain in her chest but it seems to be somewhat reproducible with movement and I suspect this may be musculoskeletal or costochondritis.  I reassessed her and explained the results of her tests and while she does point out that she is still having some pain she is comfortable with the plan for discharge and outpatient follow-up with the cardiologist with whom she already has a relationship.  I gave her my usual and customary nonspecific chest pain follow-up recommendations. Troponin I: <0.03 [CF]    Clinical Course User Index [CF] Hinda Kehr, MD     Final diagnoses:  Chest pain, unspecified type       Hinda Kehr,  MD 02/27/17 1610

## 2017-02-26 NOTE — ED Provider Notes (Signed)
[facility] Emergency Department Provider Note   ____________________________________________   I have reviewed the triage vital signs and the nursing notes.   HISTORY  Chief Complaint Chest Pain and Shortness of Breath   History limited by: Not Limited   HPI Tamalyn Wadsworth is a 41 y.o. female who presents to the emergency department today because of concern for chest pain. LOCATION:central and left chest DURATION:started today TIMING: constant SEVERITY: severe QUALITY: squeezing CONTEXT: patient denies any unusual activity or exertion. States that she had similar pain one time in the past. Has history of abnormal ekg and was sent to cardiologist by PCP. Cardiologist ordered stress test however patient never underwent stress test. MODIFYING FACTORS: none ASSOCIATED SYMPTOMS: denies any fever. Denies any leg swelling. No nausea or vomiting.  Per medical record review patient has a history of cardiology evaluation.  Past Medical History:  Diagnosis Date  . ALLERGIC RHINITIS 01/05/2008   Qualifier: Diagnosis of  By: Hassell Done FNP, Tori Milks    . Anxiety   . AVM (arteriovenous malformation)   . AVM (arteriovenous malformation) brain 02/22/2013  . Cerebral aneurysm   . Chronic migraine 01/25/2015   Botox approved diagnosis.   . Depression   . Dysmenorrhea 10/12/2008   Qualifier: Diagnosis of  By: Amil Amen MD, Benjamine Mola    . Fibromyalgia   . FREQUENCY, URINARY 03/01/2010   Qualifier: Diagnosis of  By: Keenan Bachelor RN, Melinda    . Frequent headaches 06/03/2013  . GERD (gastroesophageal reflux disease) 03/04/2007   Qualifier: Diagnosis of  By: Hassell Done FNP, Tori Milks    . Headache   . HEADACHE, TENSION 05/11/2008   Qualifier: Diagnosis of  By: Hassell Done FNP, Tori Milks    . HIP PAIN, LEFT 01/28/2010   Qualifier: Diagnosis of  By: Hassell Done FNP, Tori Milks    . Hypertension   . Left-sided weakness 03/08/2014  . Liver hemangioma   . LIVER HEMANGIOMA 06/07/2009   Annotation:  MRI 02/19/2010 - Several tiny less than 1 cm benign hepatic  hemangiomas.  One of these adjacent to the gallbladder fossa corresponds to  the lesionseen on prior ultrasound. 2.  No clinically significant abnormality identified. Qualifier: Diagnosis of  By: Isla Pence    . Localization-related symptomatic epilepsy and epileptic syndromes with complex partial seizures, not intractable, without status epilepticus ([place]) 01/22/2016  . Migraine   . Migraine without aura and without status migrainosus, not intractable 06/26/2016  . Myalgia and myositis 02/25/2012  . NEPHROLITHIASIS, HX OF 03/04/2007   Annotation: 2006 Qualifier: Diagnosis of  By: Hassell Done FNP, Tori Milks    . OBESITY 06/04/2007   Qualifier: Diagnosis of  By: Hassell Done FNP, Tori Milks    . OVARIAN CYST, LEFT 11/03/2007   Qualifier: Diagnosis of  By: Hassell Done FNP, Tori Milks    . Pain in joint, multiple sites 03/27/2010   Qualifier: Diagnosis of  By: Hassell Done FNP, Tori Milks    . 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 Seizures ([place])   . SHOULDER PAIN, RIGHT 10/31/2008   Qualifier: Diagnosis of  By: Jorene Minors, Scott    . TORTICOLLIS 06/10/2010   Qualifier: Diagnosis of  By: Hassell Done FNP, Tori Milks    . Transient alteration of awareness 01/22/2016    Patient Active Problem List   Diagnosis Date Noted  . Low HDL (under 40) 01/09/2017  . Abnormal EKG 01/09/2017  . Chest pain 01/09/2017  . Dyslipidemia 01/09/2017  . Migraine   . Liver hemangioma   . Essential hypertension   . 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 Migraine  without aura and without status migrainosus, not intractable 06/26/2016  . Localization-related symptomatic epilepsy and epileptic syndromes with complex partial seizures, not intractable, without status epilepticus ([place]) 01/22/2016  . Transient alteration of awareness 01/22/2016  . Chronic migraine 01/25/2015  . Left-sided weakness 03/08/2014  . Headache   . Seizures ([place]) 06/03/2013  . Frequent headaches  06/03/2013  . AVM (arteriovenous malformation) brain 02/22/2013  . Cerebral aneurysm   . AVM (arteriovenous malformation) 03/03/2012  . Myalgia and myositis 02/25/2012  . Fibromyalgia 02/25/2012  . TORTICOLLIS 06/10/2010  . PAIN IN JOINT, MULTIPLE SITES 03/27/2010  . FREQUENCY, URINARY 03/01/2010  . HIP PAIN, LEFT 01/28/2010  . LIVER HEMANGIOMA 06/07/2009  . SHOULDER PAIN, RIGHT 10/31/2008  . DYSMENORRHEA 10/12/2008  . HEADACHE, TENSION 05/11/2008  . ALLERGIC RHINITIS 01/05/2008  . OVARIAN CYST, LEFT 11/03/2007  . OBESITY 06/04/2007  . Anxiety 03/04/2007  . Depression 03/04/2007  . GERD (gastroesophageal reflux disease) 03/04/2007  . NEPHROLITHIASIS, HX OF 03/04/2007    Past Surgical History:  Procedure Laterality Date  . CEREBRAL ANGIOGRAM    . CEREBRAL EMBOLIZATION  dec 2013  . DILITATION & CURRETTAGE/HYSTROSCOPY WITH NOVASURE ABLATION N/A 08/04/2013   Procedure: DILATATION & CURETTAGE/HYSTEROSCOPY WITH NOVASURE ABLATION;  Surgeon: Annalee Genta, DO;  Location: [facility];  Service: Gynecology;  Laterality: N/A;  . IR GENERIC HISTORICAL  11/30/2015   IR ANGIO INTRA EXTRACRAN SEL INTERNAL CAROTID BILAT MOD SED 11/30/2015 Consuella Lose, MD [facility]  . IR GENERIC HISTORICAL  11/30/2015   IR ANGIO VERTEBRAL SEL VERTEBRAL BILAT MOD SED 11/30/2015 Consuella Lose, MD [facility]  . radiation brain    . TUBAL LIGATION    . WISDOM TOOTH EXTRACTION  2001    Prior to Admission medications   Medication Sig Start Date End Date Taking? Authorizing Provider  albuterol (PROVENTIL HFA;VENTOLIN HFA) 108 (90 Base) MCG/ACT inhaler Inhale 2 puffs into the lungs every 4 (four) hours as needed for wheezing or shortness of breath. 04/17/16   Triplett, Cari B, FNP  amphetamine-dextroamphetamine (ADDERALL) 20 MG tablet TAKE 40 mg TABLET TWICE A DAY  (8AM AND NOON) 11/28/16   [provider]  Aspirin-Acetaminophen-Caffeine (GOODY HEADACHE PO) Take by mouth daily as needed.     [provider]  celecoxib (CELEBREX) 200 MG capsule Take 1 capsule (200 mg total) by mouth 2 (two) times daily. 01/09/17   Richardo Priest, MD  cetirizine (ZYRTEC) 10 MG tablet Take 10 mg by mouth daily.    [provider]  clonazePAM (KLONOPIN) 0.5 MG tablet TAKE 2 TABLET 1-2 TIMES DAILY AS NEEDED FOR SEVERE ANXIETY (AVOID REGULAR USE) ORALLY 30 DAYS 11/15/16   [provider]  docusate sodium (COLACE) 100 MG capsule Take 100 mg by mouth daily as needed for mild constipation.    [provider]  EPINEPHrine (EPIPEN 2-PAK) 0.3 mg/0.3 mL IJ SOAJ injection Inject 0.3 mLs (0.3 mg total) into the muscle once. 02/11/15   Lisa Roca, MD  HYDROcodone-acetaminophen ([place]) 7.5-325 MG per tablet Take 1 tablet by mouth 3 (three) times daily as needed for moderate pain.  02/21/14   [provider]  methocarbamol (ROBAXIN) 750 MG tablet Take 1500 mg by mouth once daily as needed for muscle spasms 11/21/14   [provider]  montelukast (SINGULAIR) 10 MG tablet Take 10 mg by mouth at bedtime.     [provider]  Multiple Vitamin (MULTIVITAMIN) capsule Take 1 capsule by mouth daily. Woman"s one a day    [provider]  PARoxetine (PAXIL) 40 MG tablet Take 40 mg by mouth daily 12/18/14   [provider]  ranitidine (ZANTAC) 150 MG tablet Take 1 tablet (150 mg total) by mouth 2 (two) times daily. Patient not taking: Reported on 01/22/2016 02/03/15 02/03/16  Johnn Hai, PA-C  topiramate (TOPAMAX) 100 MG tablet Take 2 tablets twice a day Patient taking differently: Take 200 mg by mouth 2 (two) times daily. Take 2 tablets twice a day 06/19/16   Cameron Sprang, MD  VIMPAT 100 MG TABS TAKE 2 TABLETS BY MOUTH TWO TIMES DAILY Patient not taking: Reported on 01/09/2017 09/29/16   Cameron Sprang, MD  vitamin B-12 (CYANOCOBALAMIN) 100 MCG tablet Take 100 mcg by mouth daily.    [provider]    Allergies Patient has no known  allergies.  Family History  Problem Relation Age of Onset  . Bipolar disorder Mother   . Heart failure Mother   . Aneurysm Father   . Thyroid disease Maternal Grandmother   . Glaucoma Maternal Grandmother     Social History Social History   Tobacco Use  . Smoking status: Current Every Day Smoker  . Smokeless tobacco: Never Used  . Tobacco comment: Quit 2007  Substance Use Topics  . Alcohol use: Yes    Alcohol/week: 0.6 oz    Types: 1 Cans of beer per week    Comment: occasional  . Drug use: No    Review of Systems Constitutional: No fever/chills Eyes: No visual changes. ENT: No sore throat. Cardiovascular: Positive for chest pain. Respiratory: Positive for shortness of breath. Gastrointestinal: No abdominal pain.  No nausea, no vomiting.  No diarrhea.   Genitourinary: Negative for dysuria. Musculoskeletal: Negative for back pain. Skin: Negative for rash. Neurological: Negative for headaches, focal weakness or numbness.  ____________________________________________   PHYSICAL EXAM:  VITAL SIGNS: ED Triage Vitals  Enc Vitals Group     BP 02/26/17 2021 (!) 164/104     Pulse Rate 02/26/17 2021 (!) 118     Resp 02/26/17 2021 (!) 22     Temp 02/26/17 2021 99.1 F (37.3 C)     Temp Source 02/26/17 2021 Oral     SpO2 02/26/17 2021 98 %     Weight 02/26/17 2025 161 lb (73 kg)     Height 02/26/17 2025 5\' 1"  (1.549 m)     Head Circumference --      Peak Flow --      Pain Score 02/26/17 2021 3   Constitutional: Alert and oriented. Well appearing and in no distress. Eyes: Conjunctivae are normal.  ENT   Head: Normocephalic and atraumatic.   Nose: No congestion/rhinnorhea.   Mouth/Throat: Mucous membranes are moist.   Neck: No stridor. Hematological/Lymphatic/Immunilogical: No cervical lymphadenopathy. Cardiovascular: Tachycardic, regular rhythm.  No murmurs, rubs, or gallops.  Respiratory: Normal respiratory effort without tachypnea nor retractions.  Breath sounds are clear and equal bilaterally. No wheezes/rales/rhonchi. Gastrointestinal: Soft and non tender. No rebound. No guarding.  Genitourinary: Deferred Musculoskeletal: Normal range of motion in all extremities. No lower extremity edema. Neurologic:  Normal speech and language. No gross focal neurologic deficits are appreciated.  Skin:  Skin is warm, dry and intact. No rash noted. Psychiatric: Mood and affect are normal. Speech and behavior are normal. Patient exhibits appropriate insight and judgment.  ____________________________________________    LABS (pertinent positives/negatives)  BMP wnl except k 3.4 CBC wnl Trop <0.03   ____________________________________________   EKG  I, Nance Pear, attending  physician, personally viewed and interpreted this EKG  EKG Time: 2024 Rate: 119 Rhythm: sinus tachycardia Axis: left axis deviation Intervals: qtc 455 QRS: narrow, q waves III ST changes: no st elevation Impression: abnormal ekg  No significant morphology change from EKG dated 01/09/2017 ____________________________________________    RADIOLOGY  CXR Negative  ____________________________________________   PROCEDURES  Procedures  ____________________________________________   INITIAL IMPRESSION / ASSESSMENT AND PLAN / ED COURSE  Pertinent labs & imaging results that were available during my care of the patient were reviewed by me and considered in my medical decision making (see chart for details).  Patient presents with chest pain. Differential diagnosis includes, but is not limited to, ACS, aortic dissection, pulmonary embolism, cardiac tamponade, pneumothorax, pneumonia, pericarditis, myocarditis, GI-related causes including esophagitis/gastritis, and musculoskeletal chest wall pain.  Initial troponin negative. EKG not normal however no significant change in morphology from previous. Given tachycardia and smoking history do have some concern  for PE - d-dimer was added on. GI cocktail was tried without any relief. D-dimer and 2nd troponin pending at time of sign out. Discussed initial findings and plan with patient. ____________________________________________   FINAL CLINICAL IMPRESSION(S) / ED DIAGNOSES  Chest pain  Note: This dictation was prepared with Dragon dictation. Any transcriptional errors that result from this process are unintentional     Nance Pear, MD 02/26/17 [phone]

## 2017-02-26 NOTE — ED Notes (Signed)
Pt reports "my teeth feel numb, and I feel like I can't swallow.  My lips also feel numb."  This RN looked at patient's throat, no swelling noted.  Pt states she has not drank much water and feels that she could be dehydrated as well.  EDP at bedside at this time.

## 2017-02-26 NOTE — ED Triage Notes (Addendum)
Pt presents to ED with c/o chest heaviness with pain up both sides of her neck and down her left arm. Pt also reports sob. Symptoms started around 5pm. Pt reports having similar symptoms in the past but never so severe.  Pain not reproducible with palpation. +nausea.

## 2017-02-27 LAB — TROPONIN I: Troponin I: <0.03 ng/mL (ref <0.03)

## 2017-02-27 NOTE — ED Notes (Signed)
ED Provider at bedside. 

## 2017-02-27 NOTE — Discharge Instructions (Signed)

## 2017-06-23 ENCOUNTER — Ambulatory Visit: Payer: Medicaid Other | Admitting: Neurology

## 2017-06-23 ENCOUNTER — Telehealth: Payer: Self-pay | Admitting: *Deleted

## 2017-06-23 NOTE — Telephone Encounter (Signed)
No showed follow up appointment. 

## 2017-06-24 ENCOUNTER — Encounter: Payer: Self-pay | Admitting: Neurology
